# Patient Record
Sex: Female | Born: 1955
Health system: Southern US, Community
[De-identification: ages and names within clinical notes are randomized; demographics above are authoritative.]

## PROBLEM LIST (undated history)

## (undated) DIAGNOSIS — Z1371 Encounter for nonprocreative screening for genetic disease carrier status: Secondary | ICD-10-CM

## (undated) DIAGNOSIS — G4700 Insomnia, unspecified: Secondary | ICD-10-CM

## (undated) DIAGNOSIS — Z853 Personal history of malignant neoplasm of breast: Secondary | ICD-10-CM

## (undated) DIAGNOSIS — N901 Moderate vulvar dysplasia: Secondary | ICD-10-CM

## (undated) DIAGNOSIS — R112 Nausea with vomiting, unspecified: Secondary | ICD-10-CM

## (undated) DIAGNOSIS — Z9889 Other specified postprocedural states: Secondary | ICD-10-CM

## (undated) DIAGNOSIS — H409 Unspecified glaucoma: Secondary | ICD-10-CM

## (undated) HISTORY — PX: MASTECTOMY: SHX3

## (undated) HISTORY — PX: CATARACT EXTRACTION W/ INTRAOCULAR LENS IMPLANT: SHX1309

## (undated) HISTORY — DX: Insomnia, unspecified: G47.00

## (undated) HISTORY — PX: TOTAL MASTECTOMY: SHX6129

## (undated) HISTORY — PX: VAGINAL HYSTERECTOMY: SUR661

## (undated) HISTORY — PX: OTHER SURGICAL HISTORY: SHX169

---

## 1998-09-29 ENCOUNTER — Encounter: Payer: Self-pay | Admitting: Emergency Medicine

## 1998-09-29 ENCOUNTER — Emergency Department (HOSPITAL_COMMUNITY): Admission: EM | Admit: 1998-09-29 | Discharge: 1998-09-29 | Payer: Self-pay | Admitting: Emergency Medicine

## 1999-12-13 ENCOUNTER — Other Ambulatory Visit: Admission: RE | Admit: 1999-12-13 | Discharge: 1999-12-13 | Payer: Self-pay | Admitting: Obstetrics and Gynecology

## 2002-12-21 ENCOUNTER — Encounter: Admission: RE | Admit: 2002-12-21 | Discharge: 2002-12-21 | Payer: Self-pay | Admitting: Obstetrics and Gynecology

## 2002-12-21 ENCOUNTER — Encounter: Payer: Self-pay | Admitting: Obstetrics and Gynecology

## 2002-12-24 ENCOUNTER — Ambulatory Visit (HOSPITAL_COMMUNITY): Admission: RE | Admit: 2002-12-24 | Discharge: 2002-12-24 | Payer: Self-pay | Admitting: Obstetrics and Gynecology

## 2002-12-24 ENCOUNTER — Encounter: Payer: Self-pay | Admitting: Obstetrics and Gynecology

## 2002-12-25 ENCOUNTER — Encounter: Payer: Self-pay | Admitting: Obstetrics and Gynecology

## 2002-12-25 ENCOUNTER — Ambulatory Visit (HOSPITAL_COMMUNITY): Admission: RE | Admit: 2002-12-25 | Discharge: 2002-12-25 | Payer: Self-pay | Admitting: Obstetrics and Gynecology

## 2002-12-28 ENCOUNTER — Encounter (INDEPENDENT_AMBULATORY_CARE_PROVIDER_SITE_OTHER): Payer: Self-pay | Admitting: *Deleted

## 2002-12-28 ENCOUNTER — Ambulatory Visit (HOSPITAL_BASED_OUTPATIENT_CLINIC_OR_DEPARTMENT_OTHER): Admission: RE | Admit: 2002-12-28 | Discharge: 2002-12-28 | Payer: Self-pay | Admitting: General Surgery

## 2003-01-11 ENCOUNTER — Ambulatory Visit (HOSPITAL_BASED_OUTPATIENT_CLINIC_OR_DEPARTMENT_OTHER): Admission: RE | Admit: 2003-01-11 | Discharge: 2003-01-11 | Payer: Self-pay | Admitting: General Surgery

## 2003-01-11 ENCOUNTER — Encounter (INDEPENDENT_AMBULATORY_CARE_PROVIDER_SITE_OTHER): Payer: Self-pay | Admitting: *Deleted

## 2003-01-25 ENCOUNTER — Ambulatory Visit: Admission: RE | Admit: 2003-01-25 | Discharge: 2003-02-18 | Payer: Self-pay | Admitting: Radiation Oncology

## 2003-02-15 ENCOUNTER — Encounter: Payer: Self-pay | Admitting: General Surgery

## 2003-02-16 ENCOUNTER — Encounter: Payer: Self-pay | Admitting: General Surgery

## 2003-02-16 ENCOUNTER — Encounter (INDEPENDENT_AMBULATORY_CARE_PROVIDER_SITE_OTHER): Payer: Self-pay | Admitting: *Deleted

## 2003-02-16 ENCOUNTER — Inpatient Hospital Stay (HOSPITAL_COMMUNITY): Admission: RE | Admit: 2003-02-16 | Discharge: 2003-02-17 | Payer: Self-pay | Admitting: General Surgery

## 2003-08-26 ENCOUNTER — Encounter: Payer: Self-pay | Admitting: Family Medicine

## 2003-08-26 LAB — CONVERTED CEMR LAB
Blood Glucose, Fasting: 96 mg/dL
RBC count: 4.53 10*6/uL

## 2004-01-13 ENCOUNTER — Encounter: Admission: RE | Admit: 2004-01-13 | Discharge: 2004-01-13 | Payer: Self-pay | Admitting: General Surgery

## 2004-08-16 ENCOUNTER — Encounter: Payer: Self-pay | Admitting: Family Medicine

## 2004-08-16 LAB — CONVERTED CEMR LAB: TSH: 1.4 microintl units/mL

## 2004-08-25 ENCOUNTER — Encounter: Payer: Self-pay | Admitting: Family Medicine

## 2004-08-25 LAB — CONVERTED CEMR LAB: Blood Glucose, Fasting: 85 mg/dL

## 2005-01-19 ENCOUNTER — Encounter: Admission: RE | Admit: 2005-01-19 | Discharge: 2005-01-19 | Payer: Self-pay | Admitting: General Surgery

## 2006-01-21 ENCOUNTER — Encounter: Admission: RE | Admit: 2006-01-21 | Discharge: 2006-01-21 | Payer: Self-pay | Admitting: General Surgery

## 2006-02-05 ENCOUNTER — Ambulatory Visit: Payer: Self-pay | Admitting: Family Medicine

## 2006-02-19 ENCOUNTER — Ambulatory Visit: Payer: Self-pay | Admitting: Family Medicine

## 2006-02-19 LAB — CONVERTED CEMR LAB: Blood Glucose, Fasting: 94 mg/dL

## 2006-02-26 ENCOUNTER — Encounter: Admission: RE | Admit: 2006-02-26 | Discharge: 2006-05-27 | Payer: Self-pay | Admitting: Family Medicine

## 2006-11-20 ENCOUNTER — Ambulatory Visit: Payer: Self-pay | Admitting: Family Medicine

## 2007-02-14 ENCOUNTER — Encounter: Admission: RE | Admit: 2007-02-14 | Discharge: 2007-02-14 | Payer: Self-pay | Admitting: General Surgery

## 2007-03-06 ENCOUNTER — Telehealth (INDEPENDENT_AMBULATORY_CARE_PROVIDER_SITE_OTHER): Payer: Self-pay | Admitting: Internal Medicine

## 2007-04-10 ENCOUNTER — Telehealth (INDEPENDENT_AMBULATORY_CARE_PROVIDER_SITE_OTHER): Payer: Self-pay | Admitting: *Deleted

## 2007-08-15 ENCOUNTER — Encounter (INDEPENDENT_AMBULATORY_CARE_PROVIDER_SITE_OTHER): Payer: Self-pay | Admitting: Internal Medicine

## 2007-08-15 ENCOUNTER — Ambulatory Visit: Payer: Self-pay | Admitting: Family Medicine

## 2007-08-15 DIAGNOSIS — F17200 Nicotine dependence, unspecified, uncomplicated: Secondary | ICD-10-CM | POA: Insufficient documentation

## 2007-08-19 LAB — CONVERTED CEMR LAB
Basophils Absolute: 0 10*3/uL (ref 0.0–0.1)
Chloride: 107 meq/L (ref 96–112)
Cholesterol: 169 mg/dL (ref 0–200)
Creatinine, Ser: 0.8 mg/dL (ref 0.4–1.2)
Eosinophils Relative: 3.8 % (ref 0.0–5.0)
Glucose, Bld: 86 mg/dL (ref 70–99)
HCT: 37.9 % (ref 36.0–46.0)
Hemoglobin: 13 g/dL (ref 12.0–15.0)
LDL Cholesterol: 121 mg/dL — ABNORMAL HIGH (ref 0–99)
MCHC: 34.4 g/dL (ref 30.0–36.0)
MCV: 89.8 fL (ref 78.0–100.0)
Monocytes Absolute: 0.7 10*3/uL (ref 0.2–0.7)
Neutrophils Relative %: 43.7 % (ref 43.0–77.0)
Potassium: 4 meq/L (ref 3.5–5.1)
RBC: 4.22 M/uL (ref 3.87–5.11)
RDW: 12.8 % (ref 11.5–14.6)
Sodium: 141 meq/L (ref 135–145)
Total CHOL/HDL Ratio: 4.7
WBC: 6.5 10*3/uL (ref 4.5–10.5)

## 2007-09-21 ENCOUNTER — Encounter: Payer: Self-pay | Admitting: Family Medicine

## 2007-09-21 LAB — CONVERTED CEMR LAB: Rapid Strep: NEGATIVE

## 2007-10-01 ENCOUNTER — Telehealth (INDEPENDENT_AMBULATORY_CARE_PROVIDER_SITE_OTHER): Payer: Self-pay | Admitting: Internal Medicine

## 2007-11-26 ENCOUNTER — Ambulatory Visit: Payer: Self-pay | Admitting: Family Medicine

## 2008-02-16 ENCOUNTER — Encounter: Admission: RE | Admit: 2008-02-16 | Discharge: 2008-02-16 | Payer: Self-pay | Admitting: General Surgery

## 2008-03-01 ENCOUNTER — Telehealth (INDEPENDENT_AMBULATORY_CARE_PROVIDER_SITE_OTHER): Payer: Self-pay | Admitting: Internal Medicine

## 2008-05-05 ENCOUNTER — Telehealth (INDEPENDENT_AMBULATORY_CARE_PROVIDER_SITE_OTHER): Payer: Self-pay | Admitting: Internal Medicine

## 2008-05-06 ENCOUNTER — Encounter: Payer: Self-pay | Admitting: Family Medicine

## 2008-06-18 ENCOUNTER — Ambulatory Visit: Payer: Self-pay | Admitting: Family Medicine

## 2008-06-21 ENCOUNTER — Telehealth: Payer: Self-pay | Admitting: Family Medicine

## 2008-06-22 ENCOUNTER — Encounter: Payer: Self-pay | Admitting: Family Medicine

## 2008-06-22 ENCOUNTER — Ambulatory Visit: Payer: Self-pay | Admitting: Family Medicine

## 2008-06-22 DIAGNOSIS — E782 Mixed hyperlipidemia: Secondary | ICD-10-CM

## 2008-06-22 DIAGNOSIS — F411 Generalized anxiety disorder: Secondary | ICD-10-CM | POA: Insufficient documentation

## 2008-06-22 DIAGNOSIS — C50919 Malignant neoplasm of unspecified site of unspecified female breast: Secondary | ICD-10-CM | POA: Insufficient documentation

## 2008-06-22 DIAGNOSIS — E785 Hyperlipidemia, unspecified: Secondary | ICD-10-CM | POA: Insufficient documentation

## 2008-07-08 ENCOUNTER — Telehealth (INDEPENDENT_AMBULATORY_CARE_PROVIDER_SITE_OTHER): Payer: Self-pay | Admitting: Internal Medicine

## 2008-10-04 ENCOUNTER — Telehealth: Payer: Self-pay | Admitting: Family Medicine

## 2008-11-04 ENCOUNTER — Telehealth (INDEPENDENT_AMBULATORY_CARE_PROVIDER_SITE_OTHER): Payer: Self-pay | Admitting: Internal Medicine

## 2009-02-24 ENCOUNTER — Encounter: Admission: RE | Admit: 2009-02-24 | Discharge: 2009-02-24 | Payer: Self-pay | Admitting: General Surgery

## 2009-03-02 ENCOUNTER — Telehealth (INDEPENDENT_AMBULATORY_CARE_PROVIDER_SITE_OTHER): Payer: Self-pay | Admitting: Internal Medicine

## 2009-06-07 ENCOUNTER — Telehealth (INDEPENDENT_AMBULATORY_CARE_PROVIDER_SITE_OTHER): Payer: Self-pay | Admitting: Internal Medicine

## 2009-06-08 ENCOUNTER — Encounter (INDEPENDENT_AMBULATORY_CARE_PROVIDER_SITE_OTHER): Payer: Self-pay | Admitting: Internal Medicine

## 2009-07-06 ENCOUNTER — Telehealth (INDEPENDENT_AMBULATORY_CARE_PROVIDER_SITE_OTHER): Payer: Self-pay | Admitting: Internal Medicine

## 2009-11-03 ENCOUNTER — Telehealth (INDEPENDENT_AMBULATORY_CARE_PROVIDER_SITE_OTHER): Payer: Self-pay | Admitting: Internal Medicine

## 2009-12-05 ENCOUNTER — Telehealth: Payer: Self-pay | Admitting: Family Medicine

## 2009-12-07 ENCOUNTER — Ambulatory Visit: Payer: Self-pay | Admitting: Family Medicine

## 2009-12-09 ENCOUNTER — Ambulatory Visit: Payer: Self-pay | Admitting: Family Medicine

## 2009-12-12 LAB — CONVERTED CEMR LAB
AST: 18 units/L (ref 0–37)
Albumin: 3.7 g/dL (ref 3.5–5.2)
Alkaline Phosphatase: 83 units/L (ref 39–117)
Basophils Relative: 0.7 % (ref 0.0–3.0)
CO2: 30 meq/L (ref 19–32)
Calcium: 9 mg/dL (ref 8.4–10.5)
Chloride: 108 meq/L (ref 96–112)
Eosinophils Absolute: 0.3 10*3/uL (ref 0.0–0.7)
Glucose, Bld: 83 mg/dL (ref 70–99)
HCT: 40.9 % (ref 36.0–46.0)
Hemoglobin: 13.3 g/dL (ref 12.0–15.0)
Lymphocytes Relative: 42.8 % (ref 12.0–46.0)
Lymphs Abs: 2.9 10*3/uL (ref 0.7–4.0)
MCHC: 32.6 g/dL (ref 30.0–36.0)
Neutro Abs: 2.7 10*3/uL (ref 1.4–7.7)
Potassium: 4.7 meq/L (ref 3.5–5.1)
RBC: 4.37 M/uL (ref 3.87–5.11)
Sodium: 143 meq/L (ref 135–145)
TSH: 1.76 microintl units/mL (ref 0.35–5.50)
Total CHOL/HDL Ratio: 4
Total Protein: 6.3 g/dL (ref 6.0–8.3)

## 2010-02-28 ENCOUNTER — Encounter: Admission: RE | Admit: 2010-02-28 | Discharge: 2010-02-28 | Payer: Self-pay | Admitting: General Surgery

## 2010-07-11 ENCOUNTER — Telehealth: Payer: Self-pay | Admitting: Family Medicine

## 2010-12-05 ENCOUNTER — Ambulatory Visit
Admission: RE | Admit: 2010-12-05 | Discharge: 2010-12-05 | Payer: Self-pay | Source: Home / Self Care | Attending: Family Medicine | Admitting: Family Medicine

## 2010-12-05 NOTE — Progress Notes (Signed)
Summary: Rx Lunesta  Phone Note Refill Request Call back at [phone] Message from:  [facility] on July 11, 2010 4:32 PM  Refills Requested: Medication #1:  LUNESTA 2 MG TABS 1 at bedtime [BMN].   Last Refilled: 06/08/2010 Received faxed refill request please advise.   Method Requested: Telephone to Pharmacy Initial call taken by: Linde Gillis CMA [facility]),  July 11, 2010 4:34 PM  Follow-up for Phone Call        she has 12 rf on the prev rx.  If she is using it more frequently, she needs an OV.  okay to fill rx with same sig as before out to the appointment.  Follow-up by: Crawford Givens MD,  July 11, 2010 6:11 PM  Additional Follow-up for Phone Call Additional follow up Details #1::        I phoned the pharmacy.  Alfonso Patten can only be refilled 6 times, not 12 so the Rx. had expired.  I okayed the remaining 5 refills on the original Rx.  Please advise if this is not acceptable. Additional Follow-up by: Delilah Shan CMA [facility]),  July 12, 2010 9:14 AM    Additional Follow-up for Phone Call Additional follow up Details #2::    fine, good call, thanks.  Follow-up by: Crawford Givens MD,  July 12, 2010 10:04 AM

## 2010-12-05 NOTE — Progress Notes (Signed)
Summary: Rx Lunesta, labs, f/u appt  Phone Note Refill Request Call back at [phone] Message [facility] on December 05, 2009 8:32 AM  Refills Requested: Medication #1:  LUNESTA 2 MG TABS 1 at bedtime [BMN].   Last Refilled: 11/03/2009 Received faxed refill request, please advise   Method Requested: Electronic Initial call taken by: Linde Gillis CMA [facility]),  December 05, 2009 8:33 AM  Follow-up for Phone Call        Pt needs to be seen by whoever will be her new doctor. She hasn't been seen in 11/2 years. Follow-up by: Shaune Leeks MD,  December 05, 2009 8:34 AM  Additional Follow-up for Phone Call Additional follow up Details #1::        Left message on voicemail at her job for her to return call.  Linde Gillis CMA [facility])  December 05, 2009 11:31 AM   Patient would like to see you until you retire.  She would like labs done prior to her visit with you.  Should I schedule her for a 15 min visit or 30 min visit and what labs please?  Linde Gillis CMA [facility])  December 05, 2009 11:58 AM     Additional Follow-up for Phone Call Additional follow up Details #2::    Will see at 4Pm for 30 mins (Pls block [address]) Follow-up by: Shaune Leeks MD,  December 05, 2009 1:28 PM  Additional Follow-up for Phone Call Additional follow up Details #3:: Details for Additional Follow-up Action Taken: Appt scheduled for 12/07/2009 at 4:00 with Dr. Hetty Ely, 4:15 appt slot booked as well.  Patient notified via voicemail at her job.  [phone]. Additional Follow-up by: Linde Gillis CMA [facility]),  December 05, 2009 3:46 PM  Prescriptions: LUNESTA 2 MG TABS (ESZOPICLONE) 1 at bedtime Brand medically necessary #30 x 0   Entered and Authorized by:   Shaune Leeks MD   Signed by:   Shaune Leeks MD on 12/05/2009   Method used:   Telephoned to ...         RxID:   [id number]

## 2010-12-05 NOTE — Assessment & Plan Note (Signed)
Summary: 30 MIN APPT FOLLOW UP PER DR Javious Hallisey/[facility]   Vital Signs:  Patient profile:   55 year old female Height:      68 inches Weight:      175 pounds BMI:     26.70 Temp:     98.5 degrees F oral Pulse rate:   68 / minute Pulse rhythm:   regular BP sitting:   110 / 78  (left arm) Cuff size:   regular  Vitals Entered By: Sydell Axon LPN (December 07, 2009 4:06 PM) CC: 30 Minute checkup, has GYN, needs a refill on medication   History of Present Illness: Pt here for followup for not being seen for a while.  She has no complaints.  Seees Dr Janora Norlander for Gyn.  Preventive Screening-Counseling & Management  Alcohol-Tobacco     Alcohol drinks/day: <1     Alcohol type: beer occas     Smoking Status: current     Packs/Day: 1/2     Year Started: 1977  Caffeine-Diet-Exercise     Caffeine use/day: 1-2     Does Patient Exercise: yes     Type of exercise: walks     Times/week: 5  Problems Prior to Update: 1)  Anxiety Disorder  (ICD-300.00) 2)  Mixed Hyperlipidemia  (ICD-272.2) 3)  Aphthous Ulcers  (ICD-528.2) 4)  Pharyngitis, Acute  (ICD-462) 5)  Uri  (ICD-465.9) 6)  Neop, Malignant, Female Breast Nos  (ICD-174.9) 7)  Tobacco Abuse  (ICD-305.1) 8)  Chest Pain  (ICD-786.50) 9)  Well Adult  (ICD-V70.0)  Medications Prior to Update: 1)  Lunesta 2 Mg Tabs (Eszopiclone) .Marland Kitchen.. 1 At Bedtime  Allergies: No Known Drug Allergies  Past History:  Past Surgical History: Last updated: 06/22/2008 NSVD X 2 PARTIAL HYSTERECTOMY AT AGE 87 Abnml Pap Abnl Cryo LEFT MASTECTOMY NODES NEG NO RAD/CHEMO 12/2002  Family History: Last updated: 12/07/2009 Father dec  89 MI  Mother  dec  56 YOA CHF BROTHER A 60  IN [place]:(DRUGS) BROTHER dec 23 IN MVA BROTHER dec  72  GUN SHOT WOUND BY 16 YOA BROTHER A 72 CAD BROTHER A 62   4 SISTERS  SISTER  dec 8/09 R.F.   (AORTIC /MITRAL VALVE REPLACMENT) SISTER 09-Nov-2058 10/09Accdt death, fell off ladder and hit head SISTER A 58 CAD SISTER A 60s  THIN CV: + FATHER DECEASED MI / +/SISTER RF [id number] DM: NEGATIVE GOUT/ARTHRITIS:  PROSTATE CANCER: BREAST CANCER: + SELF(PAGETS) COLON CANCER: DEPRESSION:NEGATIVE ETOH/DRUG ABUSE: + BROTHER OTHER: NEGATIVE STROKE  Social History: Last updated: 06/22/2008 Marital Status: Married DIVORCED Children: 2 DAUGHTERS HERE// 1 SON IN [place] Occupation: [employer] -- [employer] DEPARTMENT  Risk Factors: Alcohol Use: <1 (12/07/2009) Caffeine Use: 1-2 (12/07/2009) Exercise: yes (12/07/2009)  Risk Factors: Smoking Status: current (12/07/2009) Packs/Day: 1/2 (12/07/2009)  Family History: Father dec  68 MI  Mother  dec  24 YOA CHF BROTHER A 60  IN [place]:(DRUGS) BROTHER dec 23 IN MVA BROTHER dec  54  GUN SHOT WOUND BY 16 YOA BROTHER A 70 CAD BROTHER A 62   4 SISTERS  SISTER  dec 8/09 R.F.   (AORTIC /MITRAL VALVE REPLACMENT) SISTER 11/09/2058 10/09Accdt death, fell off ladder and hit head SISTER A 58 CAD SISTER A 60s THIN CV: + FATHER DECEASED MI / +/SISTER RF [id number] DM: NEGATIVE GOUT/ARTHRITIS:  PROSTATE CANCER: BREAST CANCER: + SELF(PAGETS) COLON CANCER: DEPRESSION:NEGATIVE ETOH/DRUG ABUSE: + BROTHER OTHER: NEGATIVE STROKE  Social History: Caffeine use/day:  1-2  Review of Systems  General:  Denies chills, fatigue, fever, sweats, weakness, and weight loss. Eyes:  Denies blurring, discharge, eye irritation, and eye pain. ENT:  Denies decreased hearing, ear discharge, and earache. CV:  Denies chest pain or discomfort, fainting, fatigue, palpitations, shortness of breath with exertion, and swelling of feet. Resp:  Complains of shortness of breath; denies cough and wheezing; due to smoking. GI:  Denies abdominal pain, bloody stools, change in bowel habits, constipation, dark tarry stools, diarrhea, indigestion, loss of appetite, nausea, vomiting, vomiting blood, and yellowish skin color. GU:  Denies discharge, dysuria, nocturia, and urinary frequency. MS:   Denies joint pain, loss of strength, low back pain, muscle aches, cramps, and muscle weakness. Derm:  Denies dryness, itching, and rash. Neuro:  Denies numbness, poor balance, tingling, and tremors.  Physical Exam  General:  Well-developed,well-nourished,in no acute distress; alert,appropriate and cooperative throughout examination Head:  Normocephalic and atraumatic without obvious abnormalities. No apparent alopecia or balding. Sinuses NT. Eyes:  Conjunctiva clear bilaterally.  Ears:  External ear exam shows no significant lesions or deformities.  Otoscopic examination reveals clear canals, tympanic membranes are intact bilaterally without bulging, retraction, inflammation or discharge. Hearing is grossly normal bilaterally. Nose:  External nasal examination shows no deformity or inflammation. Nasal mucosa are pink and moist without lesions or exudates. Mouth:  Oral mucosa and oropharynx without lesions or exudates.  Teeth in good repair. 8mm white flat ulcer on right tonsillar pillar. Neck:  No deformities, masses, or tenderness noted. Chest Wall:  No deformities, masses, or tenderness noted. Breasts:  Not done, sees Dr Jackelyn Knife. Lungs:  Normal respiratory effort, chest expands symmetrically. Lungs are clear to auscultation, no crackles or wheezes. Heart:  Normal rate and regular rhythm. S1 and S2 normal without gallop, murmur, click, rub or other extra sounds. Abdomen:  soft, non-tender, normal bowel sounds, no distention, no masses, no abdominal hernia, no inguinal hernia, no hepatomegaly, and no splenomegaly.   Rectal:  Not done. Genitalia:  Not done. Msk:  no joint swelling, no joint warmth, no redness over joints, and no joint deformities in exposed joints Pulses:  R dorsalis pedis and popliteal normal and L dorsalis pedis and popliteal normal.   Extremities:  No clubbing, cyanosis, edema, or deformity noted with normal full range of motion of all joints.   Neurologic:  No cranial  nerve deficits noted. Station and gait are normal. Plantar reflexes are down-going bilaterally. DTRs are symmetrical throughout. Sensory, motor and coordinative functions appear intact. Skin:  Intact without suspicious lesions or rashes Cervical Nodes:  No lymphadenopathy noted Inguinal Nodes:  No significant adenopathy Psych:  Cognition and judgment appear intact. Alert and cooperative with normal attention span and concentration. No apparent delusions, illusions, hallucinations   Impression & Recommendations:  Problem # 1:  MIXED HYPERLIPIDEMIA (ICD-272.2) Assessment Unchanged  Will recheck via fasting labs.    HDL:35.6 (08/15/2007)  LDL:121 (08/15/2007)  Chol:169 (08/15/2007)  Trig:64 (08/15/2007)  Problem # 2:  ANXIETY DISORDER (ICD-300.00) Assessment: Improved Seems well controlled at present. Will follow as needed.  Problem # 3:  APHTHOUS ULCERS (ICD-528.2) Assessment: Improved Of throat heakled from 06/2008, no recurrence altho she has cold sores.  Problem # 4:  NEOP, MALIGNANT, FEMALE BREAST NOS (ICD-174.9) Assessment: Unchanged Breast cancer from 02/2003 so far w/o recurrence. Has mmammos regularly.  Problem # 5:  TOBACCO ABUSE (ICD-305.1) Assessment: Unchanged Encouraged to try to quit.  Complete Medication List: 1)  Lunesta 2 Mg Tabs (Eszopiclone) .Marland Kitchen.. 1 at bedtime  Patient Instructions: 1)  Pls schedule fotr fasting labs. Will report via phone tree. Prescriptions: LUNESTA 2 MG TABS (ESZOPICLONE) 1 at bedtime Brand medically necessary #30 x 12   Entered and Authorized by:   Shaune Leeks MD   Signed by:   Shaune Leeks MD on 12/07/2009   Method used:   Print then Give to Patient   RxID:   [id number]   Current Allergies (reviewed today): No known allergies

## 2010-12-13 NOTE — Assessment & Plan Note (Signed)
Summary: COUGHING, EAR and THROAT PAIN and DRAINAGE / LFW   Vital Signs:  Patient profile:   55 year old female Height:      68 inches Weight:      172.50 pounds BMI:     26.32 Temp:     98.4 degrees F oral Pulse rate:   66 / minute Pulse rhythm:   regular BP sitting:   112 / 72  (left arm) Cuff size:   regular  Vitals Entered ByMelody Comas (December 05, 2010 10:59 AM) CC: coughing, ear, throat pain   History of Present Illness: 55 yo new to me here for URI symptoms.  Started several weeks ago with runny nose, subjective fever, dry cough. Now has lingering dry cough, sinus pressure, body aches and ear pain.  Taking Mucinex OTC with little relief of symptoms.  Current Medications (verified): 1)  Lunesta 2 Mg Tabs (Eszopiclone) .Marland Kitchen.. 1 At Bedtime 2)  Amoxicillin 500 Mg Tabs (Amoxicillin) .Marland Kitchen.. 1 Tab By Mouth Two Times A Day X 10 Days 3)  Tessalon Perles 100 Mg  Caps (Benzonatate) .Marland Kitchen.. 1 Tab By Mouth Three Times A Day As Needed Cough  Allergies (verified): No Known Drug Allergies  Past History:  Past Surgical History: Last updated: 06/22/2008 NSVD X 2 PARTIAL HYSTERECTOMY AT AGE 17 Abnml Pap Abnl Cryo LEFT MASTECTOMY NODES NEG NO RAD/CHEMO 12/2002  Family History: Last updated: 12/07/2009 Father dec  36 MI  Mother  dec  29 YOA CHF BROTHER A 60  IN [place]:(DRUGS) BROTHER dec 23 IN MVA BROTHER dec  35  GUN SHOT WOUND BY 16 YOA BROTHER A 70 CAD BROTHER A 62   4 SISTERS  SISTER  dec 8/09 R.F.   (AORTIC /MITRAL VALVE REPLACMENT) SISTER 11-07-58 10/09Accdt death, fell off ladder and hit head SISTER A 58 CAD SISTER A 60s THIN CV: + FATHER DECEASED MI / +/SISTER RF [id number] DM: NEGATIVE GOUT/ARTHRITIS:  PROSTATE CANCER: BREAST CANCER: + SELF(PAGETS) COLON CANCER: DEPRESSION:NEGATIVE ETOH/DRUG ABUSE: + BROTHER OTHER: NEGATIVE STROKE  Social History: Last updated: 06/22/2008 Marital Status: Married DIVORCED Children: 2 DAUGHTERS HERE// 1 SON IN  [place] Occupation: [employer] -- [employer] DEPARTMENT  Risk Factors: Alcohol Use: <1 (12/07/2009) Caffeine Use: 1-2 (12/07/2009) Exercise: yes (12/07/2009)  Risk Factors: Smoking Status: current (12/07/2009) Packs/Day: 1/2 (12/07/2009)  Review of Systems      See HPI General:  Complains of chills and fever. ENT:  Complains of earache, nasal congestion, sinus pressure, and sore throat; denies difficulty swallowing and ear discharge. CV:  Denies chest pain or discomfort. Resp:  Complains of cough; denies shortness of breath, sputum productive, and wheezing.  Physical Exam  General:  Well-developed,well-nourished,in no acute distress; alert,appropriate and cooperative throughout examination VSS Ears:  External ear exam shows no significant lesions or deformities.  Otoscopic examination reveals clear canals, tympanic membranes are intact bilaterally without bulging, retraction, inflammation or discharge. Hearing is grossly normal bilaterally. Nose:  Boggy turbinates, sinuses +/- TTP Mouth:  pharyngeal erythema.   Lungs:  Normal respiratory effort, chest expands symmetrically. Lungs are clear to auscultation, no crackles or wheezes. Heart:  Normal rate and regular rhythm. S1 and S2 normal without gallop, murmur, click, rub or other extra sounds. Extremities:  No clubbing, cyanosis, edema, or deformity noted with normal full range of motion of all joints.   Psych:  Cognition and judgment appear intact. Alert and cooperative with normal attention span and concentration. No apparent delusions, illusions, hallucinations   Impression & Recommendations:  Problem # 1:  SINUSITIS, ACUTE (ICD-461.9) Assessment New Given duration and progression of symptoms, will treat for bacterial sinusitis. Supportive care as per pt instructions. Her updated medication list for this problem includes:    Amoxicillin 500 Mg Tabs (Amoxicillin) .Marland Kitchen... 1 tab by mouth two times a day x 10 days    Tessalon  Perles 100 Mg Caps (Benzonatate) .Marland Kitchen... 1 tab by mouth three times a day as needed cough  Complete Medication List: 1)  Lunesta 2 Mg Tabs (Eszopiclone) .Marland Kitchen.. 1 at bedtime 2)  Amoxicillin 500 Mg Tabs (Amoxicillin) .Marland Kitchen.. 1 tab by mouth two times a day x 10 days 3)  Tessalon Perles 100 Mg Caps (Benzonatate) .Marland Kitchen.. 1 tab by mouth three times a day as needed cough  Patient Instructions: 1)  Take antibiotic as directed.  Drink lots of fluids.  Treat sympotmatically with Mucinex, nasal saline irrigation, and Tylenol/Ibuprofen. Also try claritin D or zyrtec D over the counter- two times a day as needed ( have to sign for them at pharmacy). You can use warm compresses.  Cough suppressant at night. Call if not improving as expected in 5-7 days.  Prescriptions: TESSALON PERLES 100 MG  CAPS (BENZONATATE) 1 tab by mouth three times a day as needed cough  #30 x 0   Entered and Authorized by:   Ruthe Mannan MD   Signed by:   Ruthe Mannan MD on 12/05/2010   Method used:   Electron[facility]        [facility] (retail)       [address]       [place], [place]  [address]       Ph: [phone] or [phone]       Fax: [phone]   RxID:   [id number] AMOXICILLIN 500 MG TABS (AMOXICILLIN) 1 tab by mouth two times a day x 10 days  #20 x 0   Entered and Authorized by:   Ruthe Mannan MD   Signed by:   Ruthe Mannan MD on 12/05/2010   Method used:   Electronically [facility]        [facility] [id number] ([facility]ail)       [address]       [place], [place]  [address]       Ph: [phone] or [phone]       Fax: [phone]   RxID:   [id number]    Orders Added: 1)  Est. Patient Level III [12878]    Prior Medications (reviewed today): LUNESTA 2 MG TABS (ESZOPICLONE) 1 at bedtime Current Allergies (reviewed today): No known allergies

## 2011-01-03 ENCOUNTER — Telehealth: Payer: Self-pay | Admitting: Family Medicine

## 2011-01-04 ENCOUNTER — Telehealth: Payer: Self-pay | Admitting: Family Medicine

## 2011-01-10 ENCOUNTER — Other Ambulatory Visit (INDEPENDENT_AMBULATORY_CARE_PROVIDER_SITE_OTHER): Payer: 59

## 2011-01-10 ENCOUNTER — Other Ambulatory Visit: Payer: 59

## 2011-01-10 ENCOUNTER — Other Ambulatory Visit: Payer: Self-pay | Admitting: Family Medicine

## 2011-01-10 DIAGNOSIS — E782 Mixed hyperlipidemia: Secondary | ICD-10-CM

## 2011-01-10 DIAGNOSIS — R079 Chest pain, unspecified: Secondary | ICD-10-CM

## 2011-01-10 LAB — CBC WITH DIFFERENTIAL/PLATELET
Basophils Relative: 1 % (ref 0.0–3.0)
Eosinophils Relative: 3.1 % (ref 0.0–5.0)
HCT: 41.4 % (ref 36.0–46.0)
Lymphs Abs: 2.9 10*3/uL (ref 0.7–4.0)
MCV: 91 fl (ref 78.0–100.0)
Monocytes Absolute: 0.8 10*3/uL (ref 0.1–1.0)
Neutro Abs: 3.8 10*3/uL (ref 1.4–7.7)
RBC: 4.56 Mil/uL (ref 3.87–5.11)
WBC: 7.8 10*3/uL (ref 4.5–10.5)

## 2011-01-10 LAB — HEPATIC FUNCTION PANEL: Total Bilirubin: 0.9 mg/dL (ref 0.3–1.2)

## 2011-01-10 LAB — BASIC METABOLIC PANEL
BUN: 17 mg/dL (ref 6–23)
Creatinine, Ser: 0.8 mg/dL (ref 0.4–1.2)
GFR: 76.47 mL/min (ref >60.00)
Potassium: 5 mEq/L (ref 3.5–5.1)

## 2011-01-10 LAB — LIPID PANEL
LDL Cholesterol: 98 mg/dL (ref 0–99)
VLDL: 22.8 mg/dL (ref 0.0–40.0)

## 2011-01-11 NOTE — Progress Notes (Signed)
Summary: Lunesta  Phone Note Refill Request Message from:  Fax from Pharmacy on January 03, 2011 10:11 AM  Refills Requested: Medication #1:  LUNESTA 2 MG TABS 1 at bedtime [BMN] [facility]  Phone:   [phone]  Fax:   [phone]   Method Requested: Telephone to Pharmacy Initial call taken by: Delilah Shan CMA [facility]),  January 03, 2011 10:11 AM  Follow-up for Phone Call        May have one month's worth and needs to be seen for a Comp Exam.  Laurita Quint, MD  Additional Follow-up for Phone Call Additional follow up Details #1::        Patient notified as instructed by telephone. Was informed by patient that she will call back and get this scheduled. Rx called to pharmacy. Additional Follow-up by: Sydell Axon LPN,  January 03, 2011 2:49 PM    Prescriptions: LUNESTA 2 MG TABS (ESZOPICLONE) 1 at bedtime Brand medically necessary #30 x 0   Entered and Authorized by:   Shaune Leeks MD   Signed by:   Shaune Leeks MD on 01/03/2011   Method used:   Telephoned to ...       [facility] (retail)       [address]       [place], [place]  [address]       Ph: [phone] or [phone]       Fax: [phone]   RxID:   [id number]

## 2011-01-16 ENCOUNTER — Encounter (INDEPENDENT_AMBULATORY_CARE_PROVIDER_SITE_OTHER): Payer: Self-pay | Admitting: *Deleted

## 2011-01-16 NOTE — Progress Notes (Signed)
Summary: needs physical  Phone Note Call from Patient Call back at Home Phone [phone]   Caller: Patient Summary of Call: Pt is due for a physical but has been having mild chest pains for the past 2-3 weeks.  I told her we should get her in to evaluate that problem, since it may be awhile before she can get in for a physical.  She is asking if should have cholesterol labs now also.  And, also, do you want me to put her with you as her primary, since you saw her for her last check up.  Please advise. Initial call taken by: Lowella Petties CMA, AAMA,  January 04, 2011 1:55 PM  Follow-up for Phone Call        Appt to evaluate fine soon. Labs prior might as well be done for the exam...just put her in 2 appts next week with me with Bmet, Hepatic, CBC 786.5 Chol Profile, TSH 272.2 prior. Follow-up by: Shaune Leeks MD,  January 04, 2011 2:59 PM  Additional Follow-up for Phone Call Additional follow up Details #1::        Advised pt, appts scheduled. Additional Follow-up by: Lowella Petties CMA, AAMA,  January 08, 2011 12:30 PM     Appended Document:

## 2011-01-17 ENCOUNTER — Encounter: Payer: Self-pay | Admitting: Family Medicine

## 2011-01-17 ENCOUNTER — Ambulatory Visit (INDEPENDENT_AMBULATORY_CARE_PROVIDER_SITE_OTHER): Payer: 59 | Admitting: Family Medicine

## 2011-01-17 DIAGNOSIS — R079 Chest pain, unspecified: Secondary | ICD-10-CM

## 2011-01-17 DIAGNOSIS — Z0000 Encounter for general adult medical examination without abnormal findings: Secondary | ICD-10-CM

## 2011-01-23 NOTE — Assessment & Plan Note (Signed)
Summary: CHECK UP- CHEST PAINS OFF AND ON   Vital Signs:  Patient profile:   54 year old female Weight:      177 pounds Temp:     98.4 degrees F oral Pulse rate:   80 / minute Pulse rhythm:   regular BP sitting:   110 / 66  (right arm) Cuff size:   regular  Vitals Entered By: Sydell Axon LPN (January 17, 2011 10:33 AM) CC: Checkup, has a GYN    History of Present Illness: Pt here as new pt to me for Comp Exam. She has a Automotive engineer Gyn, Dr Jackelyn Knife, lat Pap last 04/2010 and last mammo was 01/2010. She has never had abnornmalds on either. She also sees a new Breast Surgeon yearly for breast cancer in 02/2003, surgery on 4/13! She had no nausea, no SOB, no diaphoesis...pain left side of chest, front to back into the left arm some, 5-6/10 at its worst for 30-72mins. She did no activity and eventually improved after 30 mins with sitting down. She had been having chest pain in her chest for a couple of days about 2 weeks that is ncompletely gone, left after a few days. She denies falling, unusual activity, lifting, work. She works for [employer] as a Pensions consultant. She does public meetings at night twice a month which are often contentious. She had some issue with the board of adjustment about that time. Also her sister's husband who lives in [place] and had heart surgery at [facility] and would stay with the pt at that time and Ms Milnes was concerned about the pains being HER heart. She has had pains like this before....remembers severe chest pain for half a day when she was 55 years old. She has had EKG in the past and was told it was fine.    Preventive Screening-Counseling & Management  Alcohol-Tobacco     Alcohol drinks/day: <1     Alcohol type: beer occas     Smoking Status: current     Packs/Day: 1/2     Year Started: 1977  Caffeine-Diet-Exercise     Caffeine use/day: 1-2     Does Patient Exercise: yes     Type of exercise: walks     Times/week: 5  Problems Prior to  Update: 1)  Health Maintenance Exam  (ICD-V70.0) 2)  Chest Pain Unspecified  (ICD-786.50) 3)  Anxiety Disorder  (ICD-300.00) 4)  Mixed Hyperlipidemia  (ICD-272.2) 5)  Neop, Malignant, Female Breast Nos  (ICD-174.9) 6)  Tobacco Abuse  (ICD-305.1)  Medications Prior to Update: 1)  Lunesta 2 Mg Tabs (Eszopiclone) .Marland Kitchen.. 1 At Bedtime 2)  Tessalon Perles 100 Mg  Caps (Benzonatate) .Marland Kitchen.. 1 Tab By Mouth Three Times A Day As Needed Cough  Current Medications (verified): 1)  Lunesta 2 Mg Tabs (Eszopiclone) .Marland Kitchen.. 1 At Bedtime  Allergies: No Known Drug Allergies  Past History:  Past Surgical History: Last updated: 06/22/2008 NSVD X 2 PARTIAL HYSTERECTOMY AT AGE 80 Abnml Pap Abnl Cryo LEFT MASTECTOMY NODES NEG NO RAD/CHEMO 12/2002  Family History: Last updated: 01/17/2011 Father dec  55 MI  Mother  dec  49 YOA CHF BROTHER A 66  IN [place]:(DRUGS) BROTHER dec 23 IN MVA BROTHER dec  6  GUN SHOT WOUND BY 16 YOA BROTHER A 72 CAD (Valve Surg) BROTHER A 64 4 SISTERS  SISTER  January 02, 2063yoa  8/09 R.F.   (AORTIC /MITRAL VALVE REPLACMENT) SISTER November 06, 2058 09/04/23 Accdt death, fell off ladder and hit  head SISTER A 60 CAD (Valve Repl) SISTER A 60s THIN CV: + FATHER DECEASED MI / +/SISTER RF [id number] DM: NEGATIVE GOUT/ARTHRITIS:  PROSTATE CANCER: BREAST CANCER: + SELF(PAGETS) COLON CANCER: DEPRESSION:NEGATIVE ETOH/DRUG ABUSE: + BROTHER OTHER: NEGATIVE STROKE  Social History: Last updated: 06/22/2008 Marital Status: Married DIVORCED Children: 2 DAUGHTERS HERE// 1 SON IN [place] Occupation: [employer] PL[employer]ING [employer]PARTMENT  Risk Factors: Alcohol Use: <1 (01/17/2011) Caffeine Use: 1-2 (01/17/2011) Exercise: yes (01/17/2011)  Risk Factors: Smoking Status: current (01/17/2011) Packs/Day: 1/2 (01/17/2011)  Family History: Father dec  33 MI  Mother  dec  50 YOA CHF BROTHER A 66  IN [place]:(DRUGS) BROTHER dec 23 IN MVA BROTHER dec  68  GUN SHOT WOUND BY 16 YOA BROTHER A 72 CAD  (Valve Surg) BROTHER A 64 4 SISTERS  SISTER  12/16/2062yoa  8/09 R.F.   (AORTIC /MITRAL VALVE REPLACMENT) SISTER 20-Oct-2058 08/18/23 Accdt death, fell off ladder and hit head SISTER A 60 CAD (Valve Repl) SISTER A 60s THIN CV: + FATHER DECEASED MI / +/SISTER RF JWJ:[id number] DM: NEGATIVE GOUT/ARTHRITIS:  PROSTATE CANCER: BREAST CANCER: + SELF(PAGETS) COLON CANCER: DEPRESSION:NEGATIVE ETOH/DRUG ABUSE: + BROTHER OTHER: NEGATIVE STROKE  Review of Systems General:  Denies chills, fatigue, fever, sweats, weakness, and weight loss. Eyes:  Denies blurring, discharge, and eye pain. ENT:  Denies decreased hearing, ear discharge, earache, and ringing in ears. CV:  Denies chest pain or discomfort, fainting, fatigue, palpitations, shortness of breath with exertion, swelling of feet, and swelling of hands. Resp:  Denies cough, shortness of breath, and wheezing. GI:  Denies abdominal pain, bloody stools, change in bowel habits, constipation, dark tarry stools, diarrhea, indigestion, loss of appetite, nausea, vomiting, vomiting blood, and yellowish skin color; salad with blue cheese dressing causes nauseation and sickness....about the time of the chest pain.. GU:  Denies discharge, dysuria, incontinence, nocturia, and urinary frequency. MS:  Complains of joint pain and cramps; denies low back pain, muscle aches, and stiffness. Derm:  Denies dryness, itching, and rash. Neuro:  Denies numbness, poor balance, tingling, and tremors.  Physical Exam  General:  Well-developed,well-nourished,in no acute distress; alert,appropriate and cooperative throughout examination Head:  Normocephalic and atraumatic without obvious abnormalities.  Sinuses NT. Eyes:  Conjunctiva clear bilaterally.  Ears:  External ear exam shows no significant lesions or deformities.  Otoscopic examination reveals clear canals, tympanic membranes are intact bilaterally without bulging, retraction, inflammation or discharge. Hearing is grossly  normal bilaterally. Nose:  External nasal examination shows no deformity or inflammation. Nasal mucosa are pink and moist without lesions or exudates. Mouth:  Oral mucosa and oropharynx without lesions or exudates.  Teeth in good repair. Neck:  No deformities, masses, or tenderness noted. Chest Wall:  No deformities, masses, or tenderness noted. Breasts:  Not Done, sees Gyn Lungs:  Normal respiratory effort, chest expands symmetrically. Lungs are clear to auscultation, no crackles or wheezes. Heart:  Normal rate and regular rhythm. S1 and S2 normal without gallop, murmur, click, rub or other extra sounds. Abdomen:  soft, non-tender, normal bowel sounds, no distention, no masses, no abdominal hernia, no inguinal hernia, no hepatomegaly, and no splenomegaly.   Rectal:  Not done. Genitalia:  Not done. Sees Gyn. Msk:  no joint swelling, no joint warmth, no redness over joints, and no joint deformities in exposed joints Pulses:  R dorsalis pedis and popliteal normal and L dorsalis pedis and popliteal normal.   Extremities:  No clubbing, cyanosis, edema, or deformity noted with normal full range  of motion of all joints.   Neurologic:  No cranial nerve deficits noted. Station and gait are normal.  Sensory, motor and coordinative functions appear intact. Skin:  Intact without suspicious lesions or rashes Cervical Nodes:  No lymphadenopathy noted Inguinal Nodes:  No significant adenopathy Psych:  Cognition and judgment appear intact. Alert and cooperative with normal attention span and concentration. No apparent delusions, illusions, hallucinations   Impression & Recommendations:  Problem # 1:  HEALTH MAINTENANCE EXAM (ICD-V70.0) Tdap today.  Discussed colon cancer screening.  Problem # 2:  CHEST PAIN UNSPECIFIED (ICD-786.50) Assessment: Improved Story difficult. Does not sound like straightforward heart problem. Most FH signif for valvular problems not circulatory.  Problem # 3:  ANXIETY  DISORDER (ICD-300.00) Assessment: Unchanged Reasonably controlled today. Will follow.  Problem # 4:  MIXED HYPERLIPIDEMIA (ICD-272.2) Assessment: Unchanged In general, good nos. Cont as is. Discussed diet. Labs Reviewed: SGOT: 21 (01/10/2011)   SGPT: 11 (01/10/2011)   HDL:38.30 (01/10/2011), 40.90 (12/09/2009)  LDL:98 (01/10/2011), 88 (12/09/2009)  Chol:159 (01/10/2011), 145 (12/09/2009)  Trig:114.0 (01/10/2011), 81.0 (12/09/2009)  Problem # 5:  NEOP, MALIGNANT, FEMALE BREAST NOS (ICD-174.9) Assessment: Unchanged Conts with surgeon.  Problem # 6:  TOBACCO ABUSE (ICD-305.1) Assessment: Unchanged Unable to convince to quit. Discussed at length, esp in light of chest pain.  Complete Medication List: 1)  Lunesta 2 Mg Tabs (Eszopiclone) .Marland Kitchen.. 1 at bedtime  Other Orders: EKG w/ Interpretation (93000)  Anticoagulation Management Assessment/Plan:            Patient Instructions: 1)  RTC as discussed. 2)  MNeeds Tdap. She is to check her records at the workplace. 3)  Will do stool card in the future.   Orders Added: 1)  Est. Patient 40-64 years [99396] 2)  Est. Patient Level III [29562] 3)  EKG w/ Interpretation [93000]    Current Allergies (reviewed today): No known allergies

## 2011-01-25 ENCOUNTER — Other Ambulatory Visit: Payer: Self-pay | Admitting: General Surgery

## 2011-01-25 DIAGNOSIS — Z1231 Encounter for screening mammogram for malignant neoplasm of breast: Secondary | ICD-10-CM

## 2011-02-02 ENCOUNTER — Other Ambulatory Visit: Payer: Self-pay | Admitting: *Deleted

## 2011-02-02 MED ORDER — ESZOPICLONE 2 MG PO TABS: 2.0000 mg | ORAL_TABLET | Freq: Every day | ORAL | Status: DC

## 2011-02-02 NOTE — Telephone Encounter (Signed)
Patient was here on 01-17-11 for complete exam.

## 2011-02-02 NOTE — Telephone Encounter (Signed)
This pt was told a year ago to make an appt for a Comp Exam. She has not been seen at all. She needs to be seen before I will give a refill for a sleeping medicine I do not like people using.

## 2011-02-05 ENCOUNTER — Other Ambulatory Visit: Payer: Self-pay | Admitting: *Deleted

## 2011-02-05 DIAGNOSIS — G4700 Insomnia, unspecified: Secondary | ICD-10-CM

## 2011-02-05 MED ORDER — ESZOPICLONE 2 MG PO TABS: 2.0000 mg | ORAL_TABLET | Freq: Every day | ORAL | Status: DC

## 2011-02-05 NOTE — Telephone Encounter (Signed)
Rx called in as directed.   

## 2011-02-05 NOTE — Telephone Encounter (Signed)
Please phone in

## 2011-03-02 ENCOUNTER — Ambulatory Visit
Admission: RE | Admit: 2011-03-02 | Discharge: 2011-03-02 | Disposition: A | Discharge status: Home / Self Care | Payer: 59 | Source: Ambulatory Visit | Attending: General Surgery | Admitting: General Surgery

## 2011-03-02 ENCOUNTER — Other Ambulatory Visit: Payer: Self-pay | Admitting: General Surgery

## 2011-03-02 DIAGNOSIS — Z1231 Encounter for screening mammogram for malignant neoplasm of breast: Secondary | ICD-10-CM

## 2011-03-16 ENCOUNTER — Other Ambulatory Visit: Payer: Self-pay | Admitting: Dermatology

## 2011-03-23 NOTE — Op Note (Signed)
   NAMEKAMEO, BAINS                         ACCOUNT NO.:  [id number]   MEDICAL RECORD NO.:  [id number]                   PATIENT TYPE:  OUT   LOCATION:  MRI                                  FACILITY:  [facility]   PHYSICIAN:  Rose Phi. Maple Hudson, M.D.                DATE OF BIRTH:  14-Oct-1956   DATE OF PROCEDURE:  12/28/2002  DATE OF DISCHARGE:  12/25/2002                                 OPERATIVE REPORT   PREOPERATIVE DIAGNOSIS:  Padgett's disease of the left nipple.   POSTOPERATIVE DIAGNOSIS:  Padgett's disease of the left nipple.   OPERATION PERFORMED:  Left partial mastectomy incorporating the nipple  areolar complex.   SURGEON:  Rose Phi. Maple Hudson, M.D.   ANESTHESIA:  General.   INDICATIONS FOR PROCEDURE:  This 55 year old female had presented with an  excoriation of the left nipple that had been present for a couple of months.  It was biopsied by Dr. Swaziland and was read as probable Padgett's disease.  A  mammogram had shown some microcalcifications in the subareolar tissue  compatible with DCIS.  MRI of the left breast was normal.   DESCRIPTION OF PROCEDURE:  After suitable general anesthesia was induced,  the patient was placed in the supine position with the left arm extended on  the arm board.  The left breast was prepped and draped in the usual fashion.  A transverse elliptical incision incorporating the nipple areolar complex  and extending both medial and laterally was then made.  The incision was  made and the whole central core of the breast was excised including the  nipple areolar complex.  Hemostasis was obtained with the cautery.  Tissue  was then infiltrated with 0.25%  Marcaine.  Clips were used to mark the space.  Deeper tissue was  approximated with 3-0 Vicryl and the skin with subcuticular 4-0 Monocryl an  Steri-Strips.  Dressing applied.  The patient was then transferred to the  recovery room in satisfactory condition having tolerated the procedure  well.                                                Rose Phi. Maple Hudson, M.D.    PRY/MEDQ  D:  12/28/2002  T:  12/28/2002  Job:  [id number]   cc:   Zenaida Niece, M.D.  [address]  [place]  [place] [address]  Fax: [phone]   Amy Y. Swaziland, M.D.  [address]  [place]  [place] [address]  Fax: [phone]

## 2011-03-23 NOTE — Op Note (Signed)
NAMEJENNI, Wendy Pruitt                         ACCOUNT NO.:  [id number]   MEDICAL RECORD NO.:  [id number]                   PATIENT TYPE:  INP   LOCATION:  2550                                 FACILITY:  [facility]   PHYSICIAN:  Alfredia Ferguson, M.D.               DATE OF BIRTH:  [birth year]   DATE OF PROCEDURE:  02/16/2003  DATE OF DISCHARGE:                                 OPERATIVE REPORT   PREOPERATIVE DIAGNOSES:  1. Left breast cancer.  2. Acquired absence of left breast secondary to #1.   POSTOPERATIVE DIAGNOSES:  1. Left breast cancer.  2. Acquired absence of left breast secondary to #1.   OPERATION:  Immediate breast reconstruction with placement of a submuscular  Mentor smooth shell saline implant 300 cc inflated to total volume of 325  cc.   SURGEON:  Alfredia Ferguson, M.D.   ANESTHESIA:  General endotracheal   INDICATIONS FOR PROCEDURE:  This is a 55 year old woman who is undergoing  left mastectomy for breast cancer today.  She wishes to undergo a single  stage breast reconstruction if possible.  She understands that I may have to  put a tissue expander in.  She understands that the implant I put in will  not give her perfect symmetry.  The risks of infection, bleeding, capsule  contracture, seroma, hematoma, uncertain life span of the implant, rippling  of the implant, malpositioning of the implant and overall dissatisfaction  with reconstruction were discussed with the patient.  Inspite of the  potential of the risks discussed, the patient wishes to proceed with the  operation.   DESCRIPTION OF PROCEDURE:  On the day prior to surgery, skin markers were  placed outlining the dimensions of the breast.  Following the completion of  the mastectomy today, I was summoned to the operating room.  Mastectomy site  was inspected and irrigated.  A 10 mm Blake drain was placed in the lateral  axillary gutter and brought out through separate stab incision.  A 6 cm  incision was  made through the pectoralis muscle in the direction of its  fibers.  A submuscular pocket was created large enough to accommodate a 300  cc saline implant.  Care was taken to make the entire pocket submuscular.  This was accomplished with the exception of the inferior most portion which  was more subfascial.  The pocket was copiously irrigated with saline  irrigation and inspected for hemostasis.  Once hemostasis had been assured,  a 300 cc saline implant was prepared by evacuating the air and placing 100  cc of sterile saline.  The implant was placed in the desired position.  3-0  Vicryl horizontal mattresses were placed in the pectoralis muscle incision  and were left untied until completing the inflation of the implant.  The  implant was inflated to a total volume of 325 cc.  It was in good position.  The fill tubing was removed and the fill cap was pushed down into the fill  port.  The previously placed 3-0 Vicryl sutures were now tied.  The  mastectomy site was again irrigated with saline irrigation and inspected for  hemostasis.  Hemostasis was assured.  The mastectomy incision was closed  using multiple interrupted 3-0 Monocryl sutures for the dermis.  The  sentinel node biopsy site was closed with 4-0 Monocryl in the dermis.  The  skin edges were closed using a running 3-0 Monocryl subcuticular for the  mastectomy incision.  The skin edges were closed with Steri-Strips on the  sentinel node incision in the axilla.  Skin edges appeared to be completely viable at the conclusion of the  procedure.  A light dressing was applied over the left breast incision.  The  patient was awakened, extubated and transported to the recovery room in  satisfactory condition.                                               Alfredia Ferguson, M.D.    WBB/MEDQ  D:  02/16/2003  T:  02/16/2003  Job:  [id number]

## 2011-03-23 NOTE — Op Note (Signed)
Wendy Pruitt, Wendy Pruitt                         ACCOUNT NO.:  [id number]   MEDICAL RECORD NO.:  [id number]                   PATIENT TYPE:  INP   LOCATION:  2550                                 FACILITY:  [facility]   PHYSICIAN:  Rose Phi. Young, M.D.                DATE OF BIRTH:  July 29, 1956   DATE OF PROCEDURE:  02/16/2003  DATE OF DISCHARGE:                                 OPERATIVE REPORT   PREOPERATIVE DIAGNOSIS:  Extensive ductal carcinoma in situ of the left  breast.   POSTOPERATIVE DIAGNOSIS:  Extensive ductal carcinoma in situ of the left  breast.   OPERATION:  Left total mastectomy with sentinel lymph node biopsy to be  followed by implant reconstruction.   SURGEON:  Rose Phi. Maple Hudson, M.D.   ANESTHESIA:  General   INDICATIONS FOR PROCEDURE:  This 55 year old female had presented with  Padgett's disease of the nipple and after a wide excision of the nipple  areolar complex in the central portion of the breast, she was found to have  extensive DCIS with involved margins.  We attempted a re-excision but that  continued with involved margins and she is now undergoing a mastectomy.   DESCRIPTION OF PROCEDURE:  Prior to coming to the operating room 1  millicurie of technetium sulfur colloid was injected intradermally.  After  suitable general anesthesia was induced, the patient was placed in a supine  position with the left arm extended on the arm board.  Scanning with the  Neoprobe revealed a hot spot in the left axilla.  A short transverse left  axillary incision was made with dissection through the subcutaneous tissue  to the pectoral fascia.  Just deep to the fascia was a hot lymph node.  We  had not injected blue dye.  I excised the hot node with counts in excess of  400 and then scanned the rest of the axilla and there were no other hot  spots and no palpable nodes.  That node was submitted for touch preps.  Good  hemostasis.   While the touch prep was being done, we  excised the old scar that had  included the incorporation of the nipple areolar complex and gave a  transverse scar.  We then did a careful total mastectomy that was basically  skin sparing in nature.  Going superiorly to near the clavicle and medially  to the sternum and inferiorly to the inframammary fold at the rectus fascia  and laterally to the latissmus dorsi muscle.  We then removed the breast by  dissecting from medial lateral trying to preserve the pectoralis fascia.  Following the removal of the breast, we had good hemostasis.   Touch prep on the sentinel lymph node was negative.   We thoroughly irrigated the field with saline and then Dr. Benna Dunks was coming  in to do reconstruction, which will be dictated in a separate note.  Rose Phi. Maple Hudson, M.D.    PRY/MEDQ  D:  02/16/2003  T:  02/16/2003  Job:  [id number]

## 2011-03-23 NOTE — Op Note (Signed)
   NAMERONNA, HERSKOWITZ                         ACCOUNT NO.:  [id number]   MEDICAL RECORD NO.:  [id number]                   PATIENT TYPE:  AMB   LOCATION:  DSC                                  FACILITY:  [facility]   PHYSICIAN:  Rose Phi. Young, M.D.                DATE OF BIRTH:  Sep 10, 1956   DATE OF PROCEDURE:  01/11/2003  DATE OF DISCHARGE:                                 OPERATIVE REPORT   PREOPERATIVE DIAGNOSIS:  Residual ductal carcinoma in situ, left breast.   POSTOPERATIVE DIAGNOSIS:  Residual ductal carcinoma in situ, left breast.   OPERATION PERFORMED:  Re-excision of a portion of the lumpectomy site.   SURGEON:  Rose Phi. Maple Hudson, M.D.   ANESTHESIA:  General.   INDICATIONS FOR PROCEDURE:  This 55 year old female had presented with  Paget's disease of the nipple.  We had done a partial mastectomy including  the central portion of her left breast which showed clean margins of DCIS  and no invasive component except at the 3 o'clock margin.  I am bringing her  back now to excise that.   DESCRIPTION OF PROCEDURE:  After suitable anesthesia was induced, the  patient was placed in a supine position and the left breast prepped and  draped in the usual fashion.  The lateral half of the previous lumpectomy  incision was then incised.  We had closed the deep tissue so there was a  very small seroma cavity.  I then excised all the tissue in the lateral  portion of the breast from about 1 o'clock to about 5 o'clock.  I oriented  the specimen with a suture and with methylene blue for the outer margin.  Hemostasis obtained with the cautery.  Wound closed with 3-0 Vicryl and  subcuticular 4-0 Monocryl and Steri-Strips.  Dressings applied.  The patient  was then transferred to the recovery room in satisfactory condition having  tolerated the procedure well.                                                Rose Phi. Maple Hudson, M.D.    PRY/MEDQ  D:  01/11/2003  T:  01/11/2003  Job:  [id number]

## 2011-07-05 ENCOUNTER — Ambulatory Visit (INDEPENDENT_AMBULATORY_CARE_PROVIDER_SITE_OTHER): Payer: 59 | Admitting: Family Medicine

## 2011-07-05 ENCOUNTER — Encounter: Payer: Self-pay | Admitting: Family Medicine

## 2011-07-05 DIAGNOSIS — T17910A Gastric contents in respiratory tract, part unspecified causing asphyxiation, initial encounter: Secondary | ICD-10-CM | POA: Insufficient documentation

## 2011-07-05 DIAGNOSIS — T17308A Unspecified foreign body in larynx causing other injury, initial encounter: Secondary | ICD-10-CM

## 2011-07-05 NOTE — Progress Notes (Signed)
  Subjective:    Patient ID: Wendy Pruitt, female    DOB: 1955/11/18, 55 y.o.   MRN: [id number]  HPI Pt here as acute workin for not feeling well for a few days and thinking she was coming down with a chest cold. She ate very late for her 830-9PM had spaghetti and woke up about 3AM, tasted the spaghetti and "couldn't breathe" for about 15-20 secs. She put water on her face and in her mouth and finally was able to breathe. This happened once before about a year ago and tasted what she had eaten and then had the same dificulty breathing. She in general has not trouble breathing, no wheezing and no chronic cough. She also generally has no trouble eating, no chest pain and no heartburn or chest tightness.  She denies fever, chills ST or rhinitis, no cough and no congestive feelings.    Review of SystemsNoncontributory except as above.       Objective:   Physical Exam  Constitutional: She appears well-developed and well-nourished. No distress.  HENT:  Head: Normocephalic and atraumatic.  Right Ear: External ear normal.  Left Ear: External ear normal.  Nose: Nose normal.  Mouth/Throat: Oropharynx is clear and moist. No oropharyngeal exudate.  Eyes: Conjunctivae and EOM are normal. Pupils are equal, round, and reactive to light.  Neck: Normal range of motion. Neck supple. No thyromegaly present.  Cardiovascular: Normal rate, regular rhythm and normal heart sounds.   Pulmonary/Chest: Effort normal and breath sounds normal. She has no wheezes. She has no rales.  Abdominal: Soft. Bowel sounds are normal. She exhibits no distension. There is no tenderness.  Lymphadenopathy:    She has no cervical adenopathy.  Skin: She is not diaphoretic.          Assessment & Plan:

## 2011-07-05 NOTE — Assessment & Plan Note (Signed)
Presumed transient, fleeting apiration of small amount of stomach contents from large, late, tomato product meal that triggered a bronchial spasm attack that resolved on its own. Would prophylax for one month with PPI and then keep for situations where has no choice but to eat late. When she has to eat late, would avoid tomato products and not overeat and stay upright as long as absolutely possible.

## 2011-07-05 NOTE — Patient Instructions (Addendum)
Take Prilosec OTC 45 mins prior to Brfst for one month and then take prophylactically before eating a late meal , if has to do that. Not eating late and not overeating would be preferable.

## 2011-07-13 ENCOUNTER — Ambulatory Visit (INDEPENDENT_AMBULATORY_CARE_PROVIDER_SITE_OTHER): Payer: 59 | Admitting: Family Medicine

## 2011-07-13 ENCOUNTER — Encounter: Payer: Self-pay | Admitting: Family Medicine

## 2011-07-13 VITALS — BP 104/72 | HR 78 | Temp 98.3°F | Wt 181.0 lb

## 2011-07-13 DIAGNOSIS — J069 Acute upper respiratory infection, unspecified: Secondary | ICD-10-CM | POA: Insufficient documentation

## 2011-07-13 MED ORDER — BENZONATATE 200 MG PO CAPS: 200.0000 mg | ORAL_CAPSULE | Freq: Three times a day (TID) | ORAL | Status: AC | PRN

## 2011-07-13 NOTE — Progress Notes (Signed)
duration of symptoms: worse over last 3 days.  Started Monday with cough rhinorrhea:no Congestion: yes, mild ear pain: Some R ear pain, itchy sore throat: yes, mild Cough:yes, some sputum (discolored) myalgias:no other concerns:no fevers.  No FCNAVD.   She's thinking about quitting smoking.   Tessalon help with the cough. Grandkids have been sick, they're in daycare.    ROS: See HPI.  Otherwise negative.    Meds, vitals, and allergies reviewed.   GEN: nad, alert and oriented HEENT: mucous membranes moist, TM w/o erythema, nasal epithelium injected, OP with cobblestoning, sinuses not ttp, R SOM noted.  NECK: supple w/o LA CV: rrr. PULM: ctab, no inc wob, no wheeze ABD: soft, +bs EXT: no edema

## 2011-07-13 NOTE — Patient Instructions (Signed)
Drink plenty of fluids, take tylenol as needed, and gargle with warm salt water for your throat.  Take tessalon for cough.  This should gradually improve.  Take care.  Let us know if you have other concerns.

## 2011-07-13 NOTE — Assessment & Plan Note (Signed)
Lungs ctab, prevalent in community.  Nontoxic.  Likely viral. ddx d/w pt.  Supportive tx with rest and fluids.   Tessalon prn for cough.  F/u prn.  She agrees.

## 2011-08-03 ENCOUNTER — Other Ambulatory Visit: Payer: Self-pay | Admitting: *Deleted

## 2011-08-03 NOTE — Telephone Encounter (Signed)
Ok to refill 

## 2011-08-05 MED ORDER — ESZOPICLONE 2 MG PO TABS: 2.0000 mg | ORAL_TABLET | Freq: Every day | ORAL | Status: DC

## 2011-08-05 NOTE — Telephone Encounter (Signed)
Please call in.  Thanks.   

## 2011-08-06 NOTE — Telephone Encounter (Signed)
Medication phoned to pharmacy.  

## 2012-01-31 ENCOUNTER — Other Ambulatory Visit: Payer: Self-pay

## 2012-01-31 ENCOUNTER — Other Ambulatory Visit (INDEPENDENT_AMBULATORY_CARE_PROVIDER_SITE_OTHER): Payer: Self-pay | Admitting: General Surgery

## 2012-01-31 DIAGNOSIS — Z1231 Encounter for screening mammogram for malignant neoplasm of breast: Secondary | ICD-10-CM

## 2012-01-31 MED ORDER — ESZOPICLONE 2 MG PO TABS: 2.0000 mg | ORAL_TABLET | Freq: Every day | ORAL | Status: DC

## 2012-01-31 NOTE — Telephone Encounter (Signed)
Medication phoned to pharmacy.  

## 2012-01-31 NOTE — Telephone Encounter (Signed)
Sent!

## 2012-01-31 NOTE — Telephone Encounter (Signed)
[facility] faxed refill request Lunesta 2 mg. Pt last seen 07/13/11.Please advise.

## 2012-02-17 ENCOUNTER — Other Ambulatory Visit: Payer: Self-pay | Admitting: Family Medicine

## 2012-02-17 DIAGNOSIS — E7800 Pure hypercholesterolemia, unspecified: Secondary | ICD-10-CM

## 2012-02-22 ENCOUNTER — Other Ambulatory Visit (INDEPENDENT_AMBULATORY_CARE_PROVIDER_SITE_OTHER): Payer: 59

## 2012-02-22 DIAGNOSIS — E7800 Pure hypercholesterolemia, unspecified: Secondary | ICD-10-CM

## 2012-02-22 LAB — COMPREHENSIVE METABOLIC PANEL
ALT: 9 U/L (ref 0–35)
Albumin: 3.8 g/dL (ref 3.5–5.2)
Alkaline Phosphatase: 84 U/L (ref 39–117)
Glucose, Bld: 93 mg/dL (ref 70–99)
Potassium: 4.2 mEq/L (ref 3.5–5.1)
Sodium: 140 mEq/L (ref 135–145)
Total Bilirubin: 0.6 mg/dL (ref 0.3–1.2)
Total Protein: 6.8 g/dL (ref 6.0–8.3)

## 2012-02-22 LAB — LIPID PANEL
HDL: 38.3 mg/dL — ABNORMAL LOW (ref >39.00)
Total CHOL/HDL Ratio: 4
VLDL: 17.8 mg/dL (ref 0.0–40.0)

## 2012-02-29 ENCOUNTER — Encounter: Payer: Self-pay | Admitting: Family Medicine

## 2012-02-29 ENCOUNTER — Ambulatory Visit (INDEPENDENT_AMBULATORY_CARE_PROVIDER_SITE_OTHER): Payer: 59 | Admitting: Family Medicine

## 2012-02-29 VITALS — BP 102/64 | HR 80 | Temp 98.6°F | Wt 180.0 lb

## 2012-02-29 DIAGNOSIS — Z0000 Encounter for general adult medical examination without abnormal findings: Secondary | ICD-10-CM

## 2012-02-29 DIAGNOSIS — G4700 Insomnia, unspecified: Secondary | ICD-10-CM

## 2012-02-29 DIAGNOSIS — C50919 Malignant neoplasm of unspecified site of unspecified female breast: Secondary | ICD-10-CM

## 2012-02-29 DIAGNOSIS — K5900 Constipation, unspecified: Secondary | ICD-10-CM | POA: Insufficient documentation

## 2012-02-29 NOTE — Assessment & Plan Note (Signed)
Continue prn lunesta.  Good effect, no ADE.

## 2012-02-29 NOTE — Assessment & Plan Note (Signed)
Routine anticipatory guidance given to patient.  See health maintenance. She's cutting back on smoking.  Td due.  We discussed and she declined.  I encouraged her to get this.   Flu shot done at work.   Pap per gynecology.  Mammogram pending, followed at [facility].   Labs discussed and unremarkable.  Exercise: we discussed.  Gradual inc in weight.  She is aware of her schedule demands and packs her lunch.  She's going to cut back on sodas.  D/w patient [id number] for colon cancer screening, including IFOB vs. colonoscopy.  She gets IFOB through gyn.

## 2012-02-29 NOTE — Progress Notes (Signed)
CPE- See plan.  Routine anticipatory guidance given to patient.  See health maintenance. She's cutting back on smoking.  Td due.  We discussed and she declined.  I encouraged her to get this.   Flu shot done at work.   Pap per gynecology.  Mammogram pending, followed at [facility].   Labs discussed and unremarkable.  Exercise: we discussed.  Gradual inc in weight.  She is aware of her schedule demands and packs her lunch.  She's going to cut back on sodas.  D/w patient [id number] for colon cancer screening, including IFOB vs. colonoscopy.  She gets IFOB through gyn.   Work stress continues.  H/o insomnia and lunesta helps.  No ADE from the medicine.  Doing well.   PMH and SH reviewed  Meds, vitals, and allergies reviewed.   ROS: See HPI.  Otherwise negative.    GEN: nad, alert and oriented HEENT: mucous membranes moist NECK: supple w/o LA CV: rrr. PULM: ctab, no inc wob ABD: soft, +bs EXT: no edema SKIN: no acute rash

## 2012-02-29 NOTE — Patient Instructions (Signed)
Don't change your meds and let me know if you have concerns.  Let me know if I can help you stop smoking.  I would think about getting a tetanus shot.

## 2012-02-29 NOTE — Assessment & Plan Note (Signed)
Mammogram pending, followed at [facility].

## 2012-03-04 ENCOUNTER — Ambulatory Visit: Payer: 59

## 2012-03-07 ENCOUNTER — Ambulatory Visit
Admission: RE | Admit: 2012-03-07 | Discharge: 2012-03-07 | Disposition: A | Discharge status: Home / Self Care | Payer: 59 | Source: Ambulatory Visit | Attending: General Surgery | Admitting: General Surgery

## 2012-03-07 DIAGNOSIS — Z1231 Encounter for screening mammogram for malignant neoplasm of breast: Secondary | ICD-10-CM

## 2012-03-10 ENCOUNTER — Encounter (INDEPENDENT_AMBULATORY_CARE_PROVIDER_SITE_OTHER): Payer: Self-pay | Admitting: General Surgery

## 2012-03-13 ENCOUNTER — Encounter (INDEPENDENT_AMBULATORY_CARE_PROVIDER_SITE_OTHER): Payer: Self-pay | Admitting: General Surgery

## 2012-03-13 ENCOUNTER — Ambulatory Visit (INDEPENDENT_AMBULATORY_CARE_PROVIDER_SITE_OTHER): Payer: 59 | Admitting: General Surgery

## 2012-03-13 VITALS — BP 124/82 | HR 80 | Temp 98.2°F | Resp 16 | Ht 68.0 in | Wt 181.8 lb

## 2012-03-13 DIAGNOSIS — Z853 Personal history of malignant neoplasm of breast: Secondary | ICD-10-CM

## 2012-03-13 NOTE — Patient Instructions (Signed)
Continue to do regular self exams 

## 2012-03-18 ENCOUNTER — Encounter (INDEPENDENT_AMBULATORY_CARE_PROVIDER_SITE_OTHER): Payer: Self-pay | Admitting: General Surgery

## 2012-03-18 NOTE — Progress Notes (Signed)
Subjective:     Patient ID: Wendy Pruitt, female   DOB: 1956/01/12, 56 y.o.   MRN: [id number]  HPI The patient is a 56 year old white female who is 9 years out from a left mastectomy for DCIS. It has been about 2 years since we saw her last . She states she has been well during that time. She has no complaints today. She denies any chest wall pain. She did have implant reconstructions.  Review of Systems  Constitutional: Negative.   HENT: Negative.   Eyes: Negative.   Respiratory: Negative.   Cardiovascular: Negative.   Gastrointestinal: Negative.   Genitourinary: Negative.   Musculoskeletal: Negative.   Skin: Negative.   Neurological: Negative.   Hematological: Negative.   Psychiatric/Behavioral: Negative.        Objective:   Physical Exam  Constitutional: She is oriented to person, place, and time. She appears well-developed and well-nourished.  HENT:  Head: Normocephalic and atraumatic.  Eyes: Conjunctivae and EOM are normal. Pupils are equal, round, and reactive to light.  Neck: Normal range of motion. Neck supple.  Cardiovascular: Normal rate, regular rhythm and normal heart sounds.   Pulmonary/Chest: Effort normal and breath sounds normal.       No palpable mass in the left reconstructed breast. No palpable mass in the right breast. No palpable axillary supraclavicular or cervical lymphadenopathy  Abdominal: Soft. Bowel sounds are normal. She exhibits no mass. There is no tenderness.  Musculoskeletal: Normal range of motion.  Lymphadenopathy:    She has no cervical adenopathy.  Neurological: She is alert and oriented to person, place, and time.  Skin: Skin is warm and dry.  Psychiatric: She has a normal mood and affect. Her behavior is normal.       Assessment:     9 years status post left mastectomy for DCIS    Plan:     At this point she will continue to do regular self exams. We will plan to see her back in one year.

## 2012-06-07 ENCOUNTER — Ambulatory Visit: Payer: Self-pay | Admitting: Internal Medicine

## 2012-07-30 ENCOUNTER — Other Ambulatory Visit: Payer: Self-pay | Admitting: Family Medicine

## 2012-07-31 NOTE — Telephone Encounter (Signed)
Electronic refill request

## 2012-08-01 NOTE — Telephone Encounter (Signed)
Medication phoned to pharmacy.  

## 2012-08-01 NOTE — Telephone Encounter (Signed)
Please call in

## 2012-08-29 ENCOUNTER — Encounter: Payer: Self-pay | Admitting: Family Medicine

## 2012-08-29 ENCOUNTER — Ambulatory Visit (INDEPENDENT_AMBULATORY_CARE_PROVIDER_SITE_OTHER): Payer: 59 | Admitting: Family Medicine

## 2012-08-29 VITALS — BP 114/62 | HR 88 | Temp 98.4°F | Wt 184.0 lb

## 2012-08-29 DIAGNOSIS — R059 Cough, unspecified: Secondary | ICD-10-CM

## 2012-08-29 DIAGNOSIS — R05 Cough: Secondary | ICD-10-CM

## 2012-08-29 MED ORDER — ALBUTEROL SULFATE HFA 108 (90 BASE) MCG/ACT IN AERS: 2.0000 | INHALATION_SPRAY | Freq: Four times a day (QID) | RESPIRATORY_TRACT | Status: DC | PRN

## 2012-08-29 NOTE — Patient Instructions (Signed)
Use the inhaler every 4-6 hours if needed for cough.  Take the OTC alka seltzer for the runny nose.  This should gradually get better.  Take care.

## 2012-08-29 NOTE — Progress Notes (Signed)
Cutting back to ~1/2 PPD.  Had a flu shot.    Sick with a head cold for the last few days.  Now with a cough and chest sx.  Ears are itching and burning.  Coughing, sneezing, sniffing.  Mult sick contacts.  No fevers known.  Some sputum.  Clear rhinorrhea.  She thought she had some possible wheezing.  Cough is throughout the day.  Taking OTC cold medicine.    Meds, vitals, and allergies reviewed.   ROS: See HPI.  Otherwise, noncontributory.  GEN: nad, alert and oriented HEENT: mucous membranes moist, tm w/o erythema but SOM noted, nasal exam w/o erythema, clear discharge noted,  OP with cobblestoning NECK: supple w/o LA CV: rrr.   PULM: completely ctab, no inc wob EXT: no edema SKIN: no acute rash

## 2012-08-31 DIAGNOSIS — R059 Cough, unspecified: Secondary | ICD-10-CM | POA: Insufficient documentation

## 2012-08-31 DIAGNOSIS — R05 Cough: Secondary | ICD-10-CM | POA: Insufficient documentation

## 2012-08-31 NOTE — Assessment & Plan Note (Signed)
Likely viral, lungs completely Ctab and nontoxic.  Would not use abx in this situation, esp given the exam.  Use the inhaler every 4-6 hours if needed for cough. Take the OTC alka seltzer for the runny nose. Should improve.

## 2013-01-16 ENCOUNTER — Encounter: Payer: Self-pay | Admitting: Family Medicine

## 2013-01-16 ENCOUNTER — Ambulatory Visit (INDEPENDENT_AMBULATORY_CARE_PROVIDER_SITE_OTHER): Payer: 59 | Admitting: Family Medicine

## 2013-01-16 VITALS — BP 122/82 | HR 82 | Temp 98.3°F

## 2013-01-16 DIAGNOSIS — J020 Streptococcal pharyngitis: Secondary | ICD-10-CM

## 2013-01-16 DIAGNOSIS — J069 Acute upper respiratory infection, unspecified: Secondary | ICD-10-CM

## 2013-01-16 LAB — POCT RAPID STREP A (OFFICE): Rapid Strep A Screen: NEGATIVE

## 2013-01-16 MED ORDER — FLUTICASONE PROPIONATE 50 MCG/ACT NA SUSP: 2.0000 | Freq: Every day | NASAL | Status: DC

## 2013-01-16 NOTE — Assessment & Plan Note (Signed)
Likely viral, with ETD component.  Reassured, would use flonase and salt water gargles.  RST neg.  Tick today may be caffeine related and she'll monitor this.  Encouraged to cut back on caffeine.

## 2013-01-16 NOTE — Progress Notes (Signed)
All sx worse in the last 1-2 days.  No fevers known. Pain is worse on L side of throat.  L ear pain noted.  Minimal rhinorrhea.  Not stuffy. No cough.  Trying to quit smoking.  Fatigued. No rash.    She has a head tick today, has had several cups of caffeine. She can make it stop.  No hand tremor.    Meds, vitals, and allergies reviewed.   ROS: See HPI.  Otherwise, noncontributory.  GEN: nad, alert and oriented HEENT: mucous membranes moist, tm w/o erythema, nasal exam w/o erythema, clear discharge noted,  OP with cobblestoning NECK: supple w/o LA CV: rrr.   PULM: ctab, no inc wob EXT: no edema SKIN: no acute rash

## 2013-01-16 NOTE — Patient Instructions (Signed)
Use the flonase in each nostril daily.  Drink plenty of fluids, take tylenol as needed, and gargle with warm salt water for your throat.  This should gradually improve.  Take care.  Let us know if you have other concerns.

## 2013-02-09 ENCOUNTER — Other Ambulatory Visit: Payer: Self-pay | Admitting: Family Medicine

## 2013-02-09 NOTE — Telephone Encounter (Signed)
LMOVM of home number.

## 2013-02-09 NOTE — Telephone Encounter (Signed)
Sent, have her schedule a visit to talk about insomnia.  Thanks.

## 2013-02-11 NOTE — Telephone Encounter (Signed)
Pt left v/m requesting call back [phone].

## 2013-02-11 NOTE — Telephone Encounter (Signed)
Medicine called to pharmacy, advised patient.  Call transferred to front desk to schedule physical.

## 2013-02-26 ENCOUNTER — Other Ambulatory Visit: Payer: Self-pay

## 2013-02-26 DIAGNOSIS — Z9012 Acquired absence of left breast and nipple: Secondary | ICD-10-CM

## 2013-02-26 DIAGNOSIS — Z1231 Encounter for screening mammogram for malignant neoplasm of breast: Secondary | ICD-10-CM

## 2013-03-10 ENCOUNTER — Ambulatory Visit: Payer: 59

## 2013-03-19 ENCOUNTER — Ambulatory Visit
Admission: RE | Admit: 2013-03-19 | Discharge: 2013-03-19 | Disposition: A | Discharge status: Home / Self Care | Payer: 59 | Source: Ambulatory Visit

## 2013-03-19 DIAGNOSIS — Z1231 Encounter for screening mammogram for malignant neoplasm of breast: Secondary | ICD-10-CM

## 2013-03-19 DIAGNOSIS — Z9012 Acquired absence of left breast and nipple: Secondary | ICD-10-CM

## 2013-03-20 ENCOUNTER — Encounter: Payer: Self-pay | Admitting: *Deleted

## 2013-04-01 ENCOUNTER — Encounter (INDEPENDENT_AMBULATORY_CARE_PROVIDER_SITE_OTHER): Payer: Self-pay | Admitting: General Surgery

## 2013-04-01 ENCOUNTER — Ambulatory Visit (INDEPENDENT_AMBULATORY_CARE_PROVIDER_SITE_OTHER): Payer: 59 | Admitting: General Surgery

## 2013-04-01 VITALS — BP 110/64 | HR 76 | Resp 14 | Ht 68.0 in | Wt 174.8 lb

## 2013-04-01 DIAGNOSIS — Z853 Personal history of malignant neoplasm of breast: Secondary | ICD-10-CM

## 2013-04-01 NOTE — Progress Notes (Signed)
Subjective:     Patient ID: Wendy Pruitt, female   DOB: 1956-04-24, 57 y.o.   MRN: [id number]  HPI The patient is a 57 year old white female who is 10 years status post left mastectomy with Reconstruction for DCIS. She has done well since her last appointment. She has had no major medical problems. She denies any chest wall pain. She recently had a mammogram of the right breast that showed no evidence of malignancy.  Review of Systems  Constitutional: Negative.   HENT: Negative.   Eyes: Negative.   Respiratory: Negative.   Cardiovascular: Negative.   Gastrointestinal: Negative.   Endocrine: Negative.   Genitourinary: Negative.   Musculoskeletal: Negative.   Skin: Negative.   Allergic/Immunologic: Negative.   Neurological: Negative.   Hematological: Negative.   Psychiatric/Behavioral: Negative.        Objective:   Physical Exam  Constitutional: She is oriented to person, place, and time. She appears well-developed and well-nourished.  HENT:  Head: Normocephalic and atraumatic.  Eyes: Conjunctivae and EOM are normal. Pupils are equal, round, and reactive to light.  Neck: Normal range of motion. Neck supple.  Cardiovascular: Normal rate, regular rhythm and normal heart sounds.   Pulmonary/Chest: Effort normal and breath sounds normal.  There is no palpable mass of the left reconstructed breast. There is no palpable mass of the right breast. There is no palpable axillary or supraclavicular cervical lymphadenopathy.  Abdominal: Soft. Bowel sounds are normal. She exhibits no mass. There is no tenderness.  Musculoskeletal: Normal range of motion.  Lymphadenopathy:    She has no cervical adenopathy.  Neurological: She is alert and oriented to person, place, and time.  Skin: Skin is warm and dry.  Psychiatric: She has a normal mood and affect. Her behavior is normal.       Assessment:     The patient is 10 years status post left mastectomy with Reconstruction for DCIS      Plan:     At this point she will continue to do regular self exams. We will plan to see her back in one year.

## 2013-04-01 NOTE — Patient Instructions (Signed)
Continue regular self exams  

## 2013-04-07 ENCOUNTER — Other Ambulatory Visit: Payer: Self-pay | Admitting: Family Medicine

## 2013-04-07 DIAGNOSIS — E7800 Pure hypercholesterolemia, unspecified: Secondary | ICD-10-CM

## 2013-04-13 ENCOUNTER — Other Ambulatory Visit: Payer: 59

## 2013-04-17 ENCOUNTER — Encounter: Payer: 59 | Admitting: Family Medicine

## 2013-04-29 ENCOUNTER — Other Ambulatory Visit (INDEPENDENT_AMBULATORY_CARE_PROVIDER_SITE_OTHER): Payer: 59

## 2013-04-29 DIAGNOSIS — E7800 Pure hypercholesterolemia, unspecified: Secondary | ICD-10-CM

## 2013-04-29 LAB — BASIC METABOLIC PANEL
BUN: 15 mg/dL (ref 6–23)
CO2: 28 mEq/L (ref 19–32)
Calcium: 9.3 mg/dL (ref 8.4–10.5)
GFR: 74.8 mL/min (ref >60.00)
Glucose, Bld: 95 mg/dL (ref 70–99)
Potassium: 4.5 mEq/L (ref 3.5–5.1)

## 2013-04-29 LAB — LIPID PANEL
Total CHOL/HDL Ratio: 5
VLDL: 18.2 mg/dL (ref 0.0–40.0)

## 2013-05-11 ENCOUNTER — Encounter: Payer: Self-pay | Admitting: Family Medicine

## 2013-05-11 ENCOUNTER — Ambulatory Visit (INDEPENDENT_AMBULATORY_CARE_PROVIDER_SITE_OTHER): Payer: 59 | Admitting: Family Medicine

## 2013-05-11 VITALS — BP 112/70 | HR 68 | Temp 98.0°F | Ht 68.0 in | Wt 173.0 lb

## 2013-05-11 DIAGNOSIS — G4700 Insomnia, unspecified: Secondary | ICD-10-CM

## 2013-05-11 DIAGNOSIS — F17200 Nicotine dependence, unspecified, uncomplicated: Secondary | ICD-10-CM

## 2013-05-11 DIAGNOSIS — Z853 Personal history of malignant neoplasm of breast: Secondary | ICD-10-CM

## 2013-05-11 DIAGNOSIS — Z0000 Encounter for general adult medical examination without abnormal findings: Secondary | ICD-10-CM

## 2013-05-11 MED ORDER — ESZOPICLONE 2 MG PO TABS: 2.0000 mg | ORAL_TABLET | Freq: Every day | ORAL | Status: DC

## 2013-05-11 NOTE — Assessment & Plan Note (Signed)
Continue lunesta 2mg  qhs.  No ADE. Call back when needing refill.

## 2013-05-11 NOTE — Patient Instructions (Addendum)
When you are checking out today, as the front about getting the records sent over from the GYN clinic.   Send the stool cards back to the GYN clinic.  I would talk with your family about your wishes.  If you get a living will done, we can scan it in.  Take care.  Glad to see you.

## 2013-05-11 NOTE — Progress Notes (Signed)
CPE- See plan.  Routine anticipatory guidance given to patient.  See health maintenance. She is smoking less, using a vapor cigarette.  Pap and mammogram up to date.   tdap declined. Discussed and encouraged.  Flu shot prev done.  I thanked her about that.   Stool cards prev done by GYN.   Living will d/w pt.  She'll talk to her kids about this, about her preferences.   D/w pt about diet and exercise.  "doing pretty good."  She lost 10 lbs intentionally.    Insomnia.  Controlled usually with lunesta, if taking 2mg  at night.  D/w pt about options.     PMH and SH reviewed   Meds, vitals, and allergies reviewed.   ROS: See HPI.  Otherwise negative.    GEN: nad, alert and oriented HEENT: mucous membranes moist NECK: supple w/o LA CV: rrr. PULM: ctab, no inc wob ABD: soft, +bs EXT: no edema SKIN: no acute rash

## 2013-05-11 NOTE — Assessment & Plan Note (Signed)
She is working on cessation, using E cig.

## 2013-05-11 NOTE — Assessment & Plan Note (Signed)
Routine anticipatory guidance given to patient.  See health maintenance. She is smoking less, using a vapor cigarette.  Pap and mammogram up to date.   tdap declined. Discussed and encouraged.  Flu shot prev done.  I thanked her about that.   Stool cards prev done by GYN.   Living will d/w pt.  She'll talk to her kids about this, about her preferences.   D/w pt about diet and exercise.  "doing pretty good."  She lost 10 lbs intentionally.

## 2013-05-11 NOTE — Assessment & Plan Note (Signed)
Followed by GYN and surgery clinic.

## 2013-05-27 ENCOUNTER — Encounter: Payer: Self-pay | Admitting: Obstetrics and Gynecology

## 2013-06-19 ENCOUNTER — Emergency Department: Payer: Self-pay | Admitting: Emergency Medicine

## 2013-06-19 LAB — CBC
HCT: 39.6 % (ref 35.0–47.0)
HGB: 13.5 g/dL (ref 12.0–16.0)
MCH: 30.1 pg (ref 26.0–34.0)
MCHC: 34 g/dL (ref 32.0–36.0)
MCV: 88 fL (ref 80–100)
RBC: 4.49 10*6/uL (ref 3.80–5.20)
RDW: 13.4 % (ref 11.5–14.5)
WBC: 13.9 10*3/uL — ABNORMAL HIGH (ref 3.6–11.0)

## 2013-06-19 LAB — COMPREHENSIVE METABOLIC PANEL
Alkaline Phosphatase: 130 U/L (ref 50–136)
Anion Gap: 4 — ABNORMAL LOW (ref 7–16)
BUN: 19 mg/dL — ABNORMAL HIGH (ref 7–18)
Calcium, Total: 9 mg/dL (ref 8.5–10.1)
Creatinine: 1.03 mg/dL (ref 0.60–1.30)
EGFR (Non-African Amer.): 59 — ABNORMAL LOW
SGOT(AST): 19 U/L (ref 15–37)
Total Protein: 7.2 g/dL (ref 6.4–8.2)

## 2013-06-22 ENCOUNTER — Telehealth: Payer: Self-pay | Admitting: Family Medicine

## 2013-06-22 NOTE — Telephone Encounter (Signed)
Spoke with patient.  She says she went to the ER at [facility] and waited 3 hours and never received any care except they drew some blood.  She said she was continually vomiting and asked for a bed because she was so weak but they said there were other people before her and they didn't have a bed.  She finally left and went home to her own bed.  She felt some better Saturday and Sunday and is at work today.  She has not heard anything from the labs that were drawn.

## 2013-06-22 NOTE — Telephone Encounter (Signed)
Faxed for records

## 2013-06-22 NOTE — Telephone Encounter (Signed)
Please see about getting any records.

## 2013-06-22 NOTE — Telephone Encounter (Signed)
Please call and get update on patient. Thanks.  

## 2013-06-22 NOTE — Telephone Encounter (Signed)
I'll review, thanks.

## 2013-06-22 NOTE — Telephone Encounter (Signed)
[facility] Triage Call Report Triage Record Num: [id number] Operator: Ether Griffins Patient Name: Wendy Pruitt Call Date & Time: 06/19/2013 8:31:00PM Patient Phone: [phone] PCP: Crawford Givens Patient Gender: Female PCP Fax : Patient DOB: [birth year] Practice Name: [facility] Reason for Call: Caller: Bonnie/Sibling; PCP: Crawford Givens; CB#: [phone]; Calling about severe pain in rt side that goes across abdomen and vomiting. Pain rated "10".Onset 06/19/13 @ 1730. Guideline: Abdominal Pain. Disposition: Activate EMS 911. Reason for Disposition: Over 31 years of age AND new onset unbearable back or abdominal pain. Advised to hang up and call 911. Protocol(s) Used: Abdominal Pain Recommended Outcome per Protocol: Activate EMS 911 Reason for Outcome: Over 59 years of age AND new onset (first episode) unbearable back or abdominal pain Care Advice: ~ IMMEDIATE ACTION An adult should stay with the patient, preferably one trained in CPR. If the person is not trained in CPR, then he or she should provide hands-only (compression-only) CPR as recommended by the American [employer] Association. ~ 06/19/2013 8:39:56PM Page 1 of 1 [email or web address]

## 2013-06-22 NOTE — Telephone Encounter (Signed)
Records arrived and placed in In Box.

## 2013-08-06 ENCOUNTER — Other Ambulatory Visit: Payer: Self-pay | Admitting: Family Medicine

## 2013-08-06 NOTE — Telephone Encounter (Signed)
Please call in.  Thanks.   

## 2013-08-06 NOTE — Telephone Encounter (Signed)
Electronic refill request.  Please advise. 

## 2013-08-07 NOTE — Telephone Encounter (Signed)
Medication phoned to pharmacy.  

## 2013-09-22 ENCOUNTER — Encounter: Payer: Self-pay | Admitting: Family Medicine

## 2013-09-22 ENCOUNTER — Ambulatory Visit (INDEPENDENT_AMBULATORY_CARE_PROVIDER_SITE_OTHER): Payer: 59 | Admitting: Family Medicine

## 2013-09-22 VITALS — BP 142/72 | HR 98 | Temp 98.3°F | Wt 175.0 lb

## 2013-09-22 DIAGNOSIS — Z6379 Other stressful life events affecting family and household: Secondary | ICD-10-CM

## 2013-09-22 MED ORDER — ALPRAZOLAM 0.25 MG PO TABS: 0.1250 mg | ORAL_TABLET | Freq: Two times a day (BID) | ORAL | Status: DC | PRN

## 2013-09-22 NOTE — Assessment & Plan Note (Signed)
Use prn xanax with sedation caution and if needed frequently we can add on SSRI or similar.  D/w pt. She agrees.  Okay for outpatient f/u. She'll call back as needed.   D/w pt about basic infection prevention given the daughter's pending chemo, ie hand washing, already had a flu shot.

## 2013-09-22 NOTE — Patient Instructions (Signed)
Take the xanax if needed.  It can make you drowsy.  If you need it often then let me know.  We may need to change your meds around otherwise. Take care.

## 2013-09-22 NOTE — Progress Notes (Signed)
Pre-visit discussion using our clinic review tool. No additional management support is needed unless otherwise documented below in the visit note.  Anxiety and tearfulness.  Daughter has breast cancer.  Her daughter has two small kids. "I'm trying to help but some days I'm falling apart."  Daughter starts chemo next week.  Pt's son in law is helping out.  "I need to be as healthy as I can."  She can get tearful suddenly.  Not sleeping well, less than normal.  "Worried to death."    Meds, vitals, and allergies reviewed.   ROS: See HPI.  Otherwise, noncontributory.  nad but tearful rrr ctab No tremor Speech wnl

## 2013-09-25 ENCOUNTER — Encounter: Payer: Self-pay | Admitting: Family Medicine

## 2013-09-25 ENCOUNTER — Ambulatory Visit (INDEPENDENT_AMBULATORY_CARE_PROVIDER_SITE_OTHER): Payer: 59 | Admitting: Family Medicine

## 2013-09-25 DIAGNOSIS — H6590 Unspecified nonsuppurative otitis media, unspecified ear: Secondary | ICD-10-CM

## 2013-09-25 NOTE — Patient Instructions (Signed)
Get some rest and gently try to pop your ears after a hot shower.

## 2013-09-25 NOTE — Progress Notes (Signed)
Pre-visit discussion using our clinic review tool. No additional management support is needed unless otherwise documented below in the visit note.  She went for the wig fitting for her daughter (who has cancer) and that was a tough but  helpful event.  She hasn't had to use the xanax yet.    L ear sx. Cracking and popping, feels full, some pain below the L ear.  No FCNAVD.  No rhinorrhea, no ST.    Meds, vitals, and allergies reviewed.   ROS: See HPI.  Otherwise, noncontributory.  GEN: nad, alert and oriented HEENT: mucous membranes moist, tm w/o erythema but B SOM noted, nasal exam w/o erythema, clear discharge noted,  OP with cobblestoning NECK: supple w/o LA CV: rrr.   PULM: ctab, no inc wob EXT: no edema SKIN: no acute rash

## 2013-09-27 DIAGNOSIS — H6590 Unspecified nonsuppurative otitis media, unspecified ear: Secondary | ICD-10-CM | POA: Insufficient documentation

## 2013-09-27 NOTE — Assessment & Plan Note (Signed)
Could be viral or allergic, nontoxic. D/w pt about precautions re: possible illness, ie hand washing and f/u prn. Rest, fluids, gently try to pop her ears.  She has delayed TM movement at OV.

## 2014-02-08 ENCOUNTER — Other Ambulatory Visit: Payer: Self-pay | Admitting: Family Medicine

## 2014-02-08 NOTE — Telephone Encounter (Signed)
Electronic refill request. Last OV 09/05/13.  Last filled:  30 tablets with 5 RF on 08/06/2013

## 2014-02-08 NOTE — Telephone Encounter (Signed)
Please call in

## 2014-02-09 NOTE — Telephone Encounter (Signed)
Medication phoned to pharmacy.  

## 2014-02-10 ENCOUNTER — Other Ambulatory Visit: Payer: Self-pay

## 2014-02-10 DIAGNOSIS — Z1231 Encounter for screening mammogram for malignant neoplasm of breast: Secondary | ICD-10-CM

## 2014-02-10 DIAGNOSIS — Z9012 Acquired absence of left breast and nipple: Secondary | ICD-10-CM

## 2014-03-22 ENCOUNTER — Ambulatory Visit
Admission: RE | Admit: 2014-03-22 | Discharge: 2014-03-22 | Disposition: A | Discharge status: Home / Self Care | Payer: 59 | Source: Ambulatory Visit

## 2014-03-22 DIAGNOSIS — Z1231 Encounter for screening mammogram for malignant neoplasm of breast: Secondary | ICD-10-CM

## 2014-03-22 DIAGNOSIS — Z9012 Acquired absence of left breast and nipple: Secondary | ICD-10-CM

## 2014-04-05 ENCOUNTER — Ambulatory Visit (INDEPENDENT_AMBULATORY_CARE_PROVIDER_SITE_OTHER): Payer: 59 | Admitting: General Surgery

## 2014-04-05 ENCOUNTER — Encounter (INDEPENDENT_AMBULATORY_CARE_PROVIDER_SITE_OTHER): Payer: Self-pay | Admitting: General Surgery

## 2014-04-05 VITALS — BP 122/80 | HR 76 | Temp 97.5°F | Ht 68.0 in | Wt 181.0 lb

## 2014-04-05 DIAGNOSIS — Z853 Personal history of malignant neoplasm of breast: Secondary | ICD-10-CM

## 2014-04-05 NOTE — Progress Notes (Signed)
Subjective:     Patient ID: Wendy Pruitt, female   DOB: 12/19/1955, 58 y.o.   MRN: [id number]  HPI The patient is a 58 year old white female who is 11 years status post left mastectomy and sentinel node mapping with Reconstruction for DCIS. She feels well and has had no major medical illnesses during the last year. Recently her daughter was diagnosed with breast cancer and by her report was negative for the BRCA gene. Her recent mammogram of her right breast showed no evidence of malignancy. The patient is clearly still smoking.  Review of Systems  Constitutional: Negative.   HENT: Negative.   Eyes: Negative.   Respiratory: Negative.   Cardiovascular: Negative.   Gastrointestinal: Negative.   Endocrine: Negative.   Genitourinary: Negative.   Musculoskeletal: Negative.   Skin: Negative.   Allergic/Immunologic: Negative.   Neurological: Negative.   Hematological: Negative.   Psychiatric/Behavioral: Negative.        Objective:   Physical Exam  Constitutional: She is oriented to person, place, and time. She appears well-developed and well-nourished.  HENT:  Head: Normocephalic and atraumatic.  Eyes: Conjunctivae and EOM are normal. Pupils are equal, round, and reactive to light.  Neck: Normal range of motion. Neck supple.  Cardiovascular: Normal rate, regular rhythm and normal heart sounds.   Pulmonary/Chest: Effort normal and breath sounds normal.  There is no palpable mass in either breast. There is no palpable axillary, supraclavicular, or cervical lymphadenopathy.  Abdominal: Soft. Bowel sounds are normal.  Musculoskeletal: Normal range of motion.  Lymphadenopathy:    She has no cervical adenopathy.  Neurological: She is alert and oriented to person, place, and time.  Skin: Skin is warm and dry.  Psychiatric: She has a normal mood and affect. Her behavior is normal.       Assessment:     The patient is 11 years status post left mastectomy for DCIS with reconstruction      Plan:     At this point she will continue to do regular self exams. I will plan to see her back in one year.

## 2014-04-05 NOTE — Patient Instructions (Signed)
Continue regular self exams  

## 2014-07-08 ENCOUNTER — Encounter: Payer: Self-pay | Admitting: Family Medicine

## 2014-07-08 ENCOUNTER — Ambulatory Visit (INDEPENDENT_AMBULATORY_CARE_PROVIDER_SITE_OTHER): Payer: 59 | Admitting: Family Medicine

## 2014-07-08 VITALS — BP 108/64 | HR 99 | Temp 98.5°F | Ht 68.0 in | Wt 168.2 lb

## 2014-07-08 DIAGNOSIS — J069 Acute upper respiratory infection, unspecified: Secondary | ICD-10-CM

## 2014-07-08 MED ORDER — AMOXICILLIN-POT CLAVULANATE 875-125 MG PO TABS: 1.0000 | ORAL_TABLET | Freq: Two times a day (BID) | ORAL | Status: DC

## 2014-07-08 NOTE — Patient Instructions (Signed)
Reschedule your physical for a convenient time.  Use the flonase for now and try to get some rest.  Drink plenty of fluids and finish the macrobid for the UTI. If you still have facial pain next week, then start augmentin.  I think you won't need it since this is likely a virus.  Take care.  Hope you feel better soon.

## 2014-07-08 NOTE — Progress Notes (Signed)
Pre visit review using our clinic review tool, if applicable. No additional management support is needed unless otherwise documented below in the visit note.  Scheduled for CPE.  Tabled given recent illness.   Recently dx'd with UTI, [facility]  Started on macrobid and pyridium in the meantime.   Since then, last night, URI sx started.  No fevers known.  No vomiting.  Appetite is still good.  No ear pain.  Stuffy.  Irritated throat.  Minimal cough.  Minimal sputum, none today.  Chest isn't sore.  Maxillary pain, burning and hurting on the face.  Fatigued.   Daughter is done with surgery/chemo/radiation for cancer.  Daughter is doing well.    Meds, vitals, and allergies reviewed.   ROS: See HPI.  Otherwise, noncontributory.  GEN: nad, alert and oriented HEENT: mucous membranes moist, tm w/o erythema, nasal exam w/o erythema, clear discharge noted,  OP with cobblestoning, sinuses ttp x4 NECK: supple w/o LA CV: rrr.   PULM: ctab, no inc wob EXT: no edema SKIN: no acute rash

## 2014-07-09 DIAGNOSIS — J069 Acute upper respiratory infection, unspecified: Secondary | ICD-10-CM | POA: Insufficient documentation

## 2014-07-09 NOTE — Assessment & Plan Note (Signed)
Likely viral, nontoxic, hold augmentin for now.  If continued facial pain, then start the abx and f/u prn. She agrees.  Supportive tx for now.

## 2014-07-13 ENCOUNTER — Telehealth: Payer: Self-pay

## 2014-07-13 NOTE — Telephone Encounter (Signed)
Left vm with instructions listed below.

## 2014-07-13 NOTE — Telephone Encounter (Signed)
Pt left v/m;pt was seen 07/08/14 with sinusitis;pt taking last macrodantin today but congestion has moved down into chest; prod cough with clear and yellow phlegm. Pt is presently taking Mucinex. No wheezing, S/T,fever or SOB. Pt wants to know if should start taking Augmentin today or what should pt do.[facility]. Pt request cb.

## 2014-07-13 NOTE — Telephone Encounter (Signed)
I would start the augmentin.  Thanks.

## 2014-08-09 ENCOUNTER — Other Ambulatory Visit: Payer: Self-pay | Admitting: Family Medicine

## 2014-08-09 NOTE — Telephone Encounter (Signed)
Received refill request electronically from pharmacy. Last refill 02/08/14 #30/5, last office visit 07/08/14/ acute. Is it okay to refill medication?

## 2014-08-10 NOTE — Telephone Encounter (Signed)
Rx called to pharmacy

## 2014-08-10 NOTE — Telephone Encounter (Signed)
Okay, please call in.  Thanks.

## 2014-08-25 ENCOUNTER — Other Ambulatory Visit: Payer: Self-pay | Admitting: Family Medicine

## 2014-08-25 DIAGNOSIS — Z8249 Family history of ischemic heart disease and other diseases of the circulatory system: Secondary | ICD-10-CM

## 2014-09-01 ENCOUNTER — Other Ambulatory Visit (INDEPENDENT_AMBULATORY_CARE_PROVIDER_SITE_OTHER): Payer: 59

## 2014-09-01 DIAGNOSIS — Z0000 Encounter for general adult medical examination without abnormal findings: Secondary | ICD-10-CM

## 2014-09-01 DIAGNOSIS — Z8249 Family history of ischemic heart disease and other diseases of the circulatory system: Secondary | ICD-10-CM

## 2014-09-01 LAB — LIPID PANEL
CHOL/HDL RATIO: 4
CHOLESTEROL: 154 mg/dL (ref 0–200)
HDL: 39.8 mg/dL (ref >39.00)
LDL CALC: 95 mg/dL (ref 0–99)
NonHDL: 114.2
Triglycerides: 96 mg/dL (ref 0.0–149.0)
VLDL: 19.2 mg/dL (ref 0.0–40.0)

## 2014-09-01 LAB — BASIC METABOLIC PANEL
BUN: 21 mg/dL (ref 6–23)
CALCIUM: 9.4 mg/dL (ref 8.4–10.5)
CHLORIDE: 105 meq/L (ref 96–112)
CO2: 32 meq/L (ref 19–32)
CREATININE: 0.9 mg/dL (ref 0.4–1.2)
GFR: 68.7 mL/min (ref >60.00)
Glucose, Bld: 85 mg/dL (ref 70–99)
Potassium: 4.3 mEq/L (ref 3.5–5.1)
Sodium: 141 mEq/L (ref 135–145)

## 2014-09-07 ENCOUNTER — Ambulatory Visit (INDEPENDENT_AMBULATORY_CARE_PROVIDER_SITE_OTHER): Payer: 59 | Admitting: Family Medicine

## 2014-09-07 ENCOUNTER — Encounter: Payer: Self-pay | Admitting: Family Medicine

## 2014-09-07 DIAGNOSIS — Z0000 Encounter for general adult medical examination without abnormal findings: Secondary | ICD-10-CM

## 2014-09-07 DIAGNOSIS — Z7189 Other specified counseling: Secondary | ICD-10-CM

## 2014-09-07 DIAGNOSIS — G4700 Insomnia, unspecified: Secondary | ICD-10-CM

## 2014-09-07 DIAGNOSIS — Z1211 Encounter for screening for malignant neoplasm of colon: Secondary | ICD-10-CM

## 2014-09-07 NOTE — Progress Notes (Signed)
Pre visit review using our clinic review tool, if applicable. No additional management support is needed unless otherwise documented below in the visit note.  CPE- See plan.  Routine anticipatory guidance given to patient.  See health maintenance. Tetanus shot d/w pt.  Last done >10 years ago.  Declined. Flu done at work.  Done about 1 month ago.  Shingles encouraged.  D/w pt.   PNA shot d/w pt. Pelvic per gyn.  Mammogram last done this year.  DXA not due yet.  Living will d/w pt.  Would have her daughter Joya Salm if patient were incapacitated.  Diet and exercise d/w pt.   Weight up slightly recently.  D/w pt and diet and exercise. D/w patient [id number] for colon cancer screening, including IFOB vs. colonoscopy.  Risks and benefits of both were discussed and patient voiced understanding.  Pt elects for: IFOB.   Insomnia.  Some help from the med. No ADE.  Sleeping better with the medicine.  No parasomnias.    PMH and SH reviewed  Meds, vitals, and allergies reviewed.   ROS: See HPI.  Otherwise negative.    GEN: nad, alert and oriented HEENT: mucous membranes moist NECK: supple w/o LA CV: rrr. PULM: ctab, no inc wob ABD: soft, +bs EXT: no edema SKIN: no acute rash

## 2014-09-07 NOTE — Patient Instructions (Addendum)
Check with your insurance to see if they will cover the shingles shot. Go to the lab on the way out.  We'll contact you with your lab report. Take care.  Glad to see you.  

## 2014-09-08 ENCOUNTER — Telehealth: Payer: Self-pay | Admitting: Family Medicine

## 2014-09-08 DIAGNOSIS — Z7189 Other specified counseling: Secondary | ICD-10-CM | POA: Insufficient documentation

## 2014-09-08 NOTE — Telephone Encounter (Signed)
Pt dropped off [facility] insurance form to be completed/signed by Dr. Damita Dunnings.  I have placed this in Lugene's in-box.  Please pass on to Dr. Damita Dunnings. Thank you.

## 2014-09-08 NOTE — Telephone Encounter (Signed)
emmi emailed °

## 2014-09-08 NOTE — Telephone Encounter (Signed)
I'll work on the hard copy when I can.

## 2014-09-08 NOTE — Assessment & Plan Note (Signed)
Controlled with current meds, continue as is.  Overall doing well.

## 2014-09-08 NOTE — Assessment & Plan Note (Addendum)
Routine anticipatory guidance given to patient.  See health maintenance. Tetanus shot d/w pt.  Last done >10 years ago.  Declined. Flu done at work.  Done about 1 month ago.  Shingles encouraged.  D/w pt.   PNA shot d/w pt. Pelvic per gyn.  Mammogram last done this year.  DXA not due yet.  Living will d/w pt.  Would have her daughter Joya Salm if patient were incapacitated.  Diet and exercise d/w pt.   Weight up slightly recently.  D/w pt and diet and exercise. D/w patient [id number] for colon cancer screening, including IFOB vs. colonoscopy.  Risks and benefits of both were discussed and patient voiced understanding.  Pt elects for: IFOB.  Labs d/w pt.  Wnl.

## 2014-09-08 NOTE — Telephone Encounter (Signed)
On your desk

## 2015-02-02 ENCOUNTER — Other Ambulatory Visit: Payer: Self-pay | Admitting: Family Medicine

## 2015-02-02 NOTE — Telephone Encounter (Signed)
Lunesta refill request.  Last seen 09/07/2014 last filled 08/20/2014.  Please advise.

## 2015-02-03 NOTE — Telephone Encounter (Signed)
Please call in.  Thanks.   

## 2015-02-03 NOTE — Telephone Encounter (Signed)
Medication phoned to pharmacy.  

## 2015-03-02 ENCOUNTER — Other Ambulatory Visit: Payer: Self-pay

## 2015-03-02 DIAGNOSIS — Z1231 Encounter for screening mammogram for malignant neoplasm of breast: Secondary | ICD-10-CM

## 2015-03-29 ENCOUNTER — Ambulatory Visit
Admission: RE | Admit: 2015-03-29 | Discharge: 2015-03-29 | Disposition: A | Discharge status: Home / Self Care | Payer: 59 | Source: Ambulatory Visit

## 2015-03-29 DIAGNOSIS — Z1231 Encounter for screening mammogram for malignant neoplasm of breast: Secondary | ICD-10-CM

## 2015-03-30 ENCOUNTER — Encounter: Payer: Self-pay | Admitting: *Deleted

## 2015-07-29 ENCOUNTER — Other Ambulatory Visit: Payer: Self-pay | Admitting: Family Medicine

## 2015-08-01 NOTE — Telephone Encounter (Signed)
Refill requested 9/23.  Last filled #30 with 5 refills on 3/31.  Dr. Damita Dunnings is out of the office.  Okay to refill?

## 2015-08-02 ENCOUNTER — Other Ambulatory Visit: Payer: Self-pay | Admitting: Family Medicine

## 2015-08-02 NOTE — Telephone Encounter (Signed)
Called to pharmacy.  Patient notified.  

## 2015-08-02 NOTE — Telephone Encounter (Signed)
Patient calling to check status of Lunesta refill.  Called to pharmacy.

## 2015-08-30 ENCOUNTER — Other Ambulatory Visit: Payer: Self-pay | Admitting: Family Medicine

## 2015-08-30 NOTE — Telephone Encounter (Signed)
Received refill request electronically' Last refill 08/01/15 #30 Last office visit 09/07/14 Is it okay to refill medication?

## 2015-08-31 NOTE — Telephone Encounter (Signed)
Rx called to pharmacy as instructed. 

## 2015-08-31 NOTE — Telephone Encounter (Signed)
Please call in.  Thanks.   

## 2015-10-10 ENCOUNTER — Other Ambulatory Visit: Payer: Self-pay | Admitting: Family Medicine

## 2015-10-10 DIAGNOSIS — R739 Hyperglycemia, unspecified: Secondary | ICD-10-CM

## 2015-10-12 ENCOUNTER — Other Ambulatory Visit (INDEPENDENT_AMBULATORY_CARE_PROVIDER_SITE_OTHER): Payer: Commercial Managed Care - HMO

## 2015-10-12 DIAGNOSIS — R739 Hyperglycemia, unspecified: Secondary | ICD-10-CM | POA: Diagnosis not present

## 2015-10-12 LAB — BASIC METABOLIC PANEL
BUN: 20 mg/dL (ref 6–23)
CHLORIDE: 106 meq/L (ref 96–112)
CO2: 30 mEq/L (ref 19–32)
Calcium: 9.3 mg/dL (ref 8.4–10.5)
Creatinine, Ser: 0.8 mg/dL (ref 0.40–1.20)
GFR: 77.4 mL/min (ref >60.00)
Glucose, Bld: 90 mg/dL (ref 70–99)
POTASSIUM: 4.2 meq/L (ref 3.5–5.1)
SODIUM: 142 meq/L (ref 135–145)

## 2015-10-12 LAB — LIPID PANEL
CHOLESTEROL: 142 mg/dL (ref 0–200)
HDL: 35.8 mg/dL — AB (ref >39.00)
LDL Cholesterol: 89 mg/dL (ref 0–99)
NonHDL: 105.99
TRIGLYCERIDES: 86 mg/dL (ref 0.0–149.0)
Total CHOL/HDL Ratio: 4
VLDL: 17.2 mg/dL (ref 0.0–40.0)

## 2015-10-14 ENCOUNTER — Encounter: Payer: Self-pay | Admitting: Family Medicine

## 2015-10-14 ENCOUNTER — Ambulatory Visit (INDEPENDENT_AMBULATORY_CARE_PROVIDER_SITE_OTHER): Payer: Commercial Managed Care - HMO | Admitting: Family Medicine

## 2015-10-14 ENCOUNTER — Other Ambulatory Visit: Payer: Self-pay | Admitting: Family Medicine

## 2015-10-14 VITALS — BP 118/60 | HR 86 | Temp 98.6°F | Wt 169.2 lb

## 2015-10-14 DIAGNOSIS — Z1211 Encounter for screening for malignant neoplasm of colon: Secondary | ICD-10-CM

## 2015-10-14 DIAGNOSIS — G4700 Insomnia, unspecified: Secondary | ICD-10-CM

## 2015-10-14 DIAGNOSIS — Z0000 Encounter for general adult medical examination without abnormal findings: Secondary | ICD-10-CM

## 2015-10-14 DIAGNOSIS — Z119 Encounter for screening for infectious and parasitic diseases, unspecified: Secondary | ICD-10-CM

## 2015-10-14 MED ORDER — ESZOPICLONE 2 MG PO TABS: ORAL_TABLET | ORAL | Status: DC

## 2015-10-14 NOTE — Patient Instructions (Addendum)
Go to the lab on the way out.  We'll contact you with your lab report (stool cards).  Check with your insurance to see if they will cover the shingles shot. I would check on getting a tetanus shot.   Take care.  Glad to see you.  Keep working on your weight.  Let me know I can help you stop smoking.

## 2015-10-14 NOTE — Assessment & Plan Note (Signed)
Some help from the med. No ADE. Sleeping better with the medicine. No parasomnias.  Continue as is.  Doing well.   rx given to patient at [facility].

## 2015-10-14 NOTE — Progress Notes (Signed)
Pre visit review using our clinic review tool, if applicable. No additional management support is needed unless otherwise documented below in the visit note.  CPE- See plan.  Routine anticipatory guidance given to patient.  See health maintenance. Tetanus shot d/w pt. encouraged, declined.  Flu done at work. Done 08/2015 Shingles encouraged. D/w pt.  PNA shot d/w pt. Pelvic per gyn. d/w pt.  She'll call about f/u.   Mammogram done 03/2015 DXA not due yet.  Living will d/w pt. Would have her daughter Joya Salm if patient were incapacitated.  Diet and exercise d/w pt. D/w pt and diet and exercise.  Working on diet, esp at work.  Encouraged exercise.   D/w patient [id number] for colon cancer screening, including IFOB vs. colonoscopy. Risks and benefits of both were discussed and patient voiced understanding. Pt elects for: IFOB.  D/w pt about tobacco cessation.   Pt opts in for HCV and HIV screening with next set of labs.  D/w pt re: routine screening.     Insomnia. Some help from the med. No ADE. Sleeping better with the medicine. No parasomnias.   PMH and SH reviewed  Meds, vitals, and allergies reviewed.   ROS: See HPI.  Otherwise negative.    GEN: nad, alert and oriented HEENT: mucous membranes moist NECK: supple w/o LA CV: rrr. PULM: ctab, no inc wob ABD: soft, +bs EXT: no edema

## 2015-10-14 NOTE — Assessment & Plan Note (Signed)
Tetanus shot d/w pt. encouraged, declined.  Flu done at work. Done 08/2015  Shingles encouraged. D/w pt.  PNA shot d/w pt.  Pelvic per gyn. d/w pt. She'll call about f/u.  Mammogram done 03/2015  DXA not due yet.  Living will d/w pt. Would have her daughter Joya Salm if patient were incapacitated.  Diet and exercise d/w pt. D/w pt and diet and exercise. Working on diet, esp at work. Encouraged exercise.  D/w patient [id number] for colon cancer screening, including IFOB vs. colonoscopy. Risks and benefits of both were discussed and patient voiced understanding. Pt elects for: IFOB.  D/w pt about tobacco cessation.  Precontemplative.

## 2015-10-28 ENCOUNTER — Other Ambulatory Visit: Payer: Self-pay | Admitting: Family Medicine

## 2016-04-17 ENCOUNTER — Other Ambulatory Visit: Payer: Self-pay | Admitting: Family Medicine

## 2016-04-17 NOTE — Telephone Encounter (Signed)
Received refill electronically Last refill 10/14/15 #30/5 refills Last office visit 10/14/15

## 2016-04-18 NOTE — Telephone Encounter (Signed)
Rx called to pharmacy as instructed. 

## 2016-04-18 NOTE — Telephone Encounter (Signed)
Please call in.  Thanks.   

## 2016-05-04 ENCOUNTER — Ambulatory Visit (INDEPENDENT_AMBULATORY_CARE_PROVIDER_SITE_OTHER): Payer: Commercial Managed Care - HMO | Admitting: Family Medicine

## 2016-05-04 ENCOUNTER — Encounter: Payer: Self-pay | Admitting: Family Medicine

## 2016-05-04 VITALS — BP 120/72 | HR 82 | Temp 98.6°F | Ht 68.0 in | Wt 172.8 lb

## 2016-05-04 DIAGNOSIS — J069 Acute upper respiratory infection, unspecified: Secondary | ICD-10-CM | POA: Diagnosis not present

## 2016-05-04 NOTE — Progress Notes (Signed)
Pre visit review using our clinic review tool, if applicable. No additional management support is needed unless otherwise documented below in the visit note. 

## 2016-05-04 NOTE — Assessment & Plan Note (Signed)
Symptoms most consistent by viral upper respiratory infection. Possible bronchitis as well. She is improving overall. No focal findings indicate bacterial illness. Lung exam mildly coarse and I offered her an albuterol inhaler to see if this would be beneficial though she deferred this opting to continue to monitor. She's given return precautions.

## 2016-05-04 NOTE — Progress Notes (Signed)
Patient ID: Wendy Pruitt, female   DOB: Oct 20, 1956, 60 y.o.   MRN: [id number]  Tommi Rumps, MD Phone: [phone]  Wendy Pruitt is a 60 y.o. female who presents today for same-day visit.  Patient notes cough and congestion for several days starting with sinus congestion and nasal congestion. Then developed throat and chest congestion. Notes cough is productive of some greenish yellowish mucus though this is improving. She feels better today. No sinus or nasal congestion now. No shortness of breath. No wheezing. She reports cough does not keep her up at night.  PMH: Smoker   ROS see history of present illness  Objective  Physical Exam Filed Vitals:   05/04/16 1553  BP: 120/72  Pulse: 82  Temp: 98.6 F (37 C)    BP Readings from Last 3 Encounters:  05/04/16 120/72  10/14/15 118/60  09/07/14 100/60   Wt Readings from Last 3 Encounters:  05/04/16 172 lb 12.8 oz (78.382 kg)  10/14/15 169 lb 4 oz (76.771 kg)  09/07/14 170 lb (77.111 kg)    Physical Exam  Constitutional: She is well-developed, well-nourished, and in no distress.  HENT:  Head: Normocephalic and atraumatic.  Right Ear: External ear normal.  Left Ear: External ear normal.  Mouth/Throat: Oropharynx is clear and moist. No oropharyngeal exudate.  Normal TMs bilaterally  Eyes: Conjunctivae are normal. Pupils are equal, round, and reactive to light.  Neck: Neck supple.  Cardiovascular: Normal rate, regular rhythm and normal heart sounds.   Pulmonary/Chest: Effort normal. No respiratory distress. She has no wheezes. She has no rales.  Mildly coarse breath sounds throughout  Lymphadenopathy:    She has no cervical adenopathy.  Neurological: She is alert. Gait normal.  Skin: Skin is warm and dry. She is not diaphoretic.     Assessment/Plan: Please see individual problem list.  Viral upper respiratory infection Symptoms most consistent by viral upper respiratory infection. Possible bronchitis as well.  She is improving overall. No focal findings indicate bacterial illness. Lung exam mildly coarse and I offered her an albuterol inhaler to see if this would be beneficial though she deferred this opting to continue to monitor. She's given return precautions.   Tommi Rumps, MD [place]

## 2016-05-04 NOTE — Patient Instructions (Addendum)
Nice to meet you. Your symptoms are likely related to a viral illness. They should continue to get better. You can try over-the-counter Claritin for any congestion or ear congestion. If you develop chest pain, shortness of breath, cough productive of blood, fevers, or any new or changing symptoms please seek medical attention.

## 2016-05-16 ENCOUNTER — Other Ambulatory Visit: Payer: Self-pay | Admitting: General Surgery

## 2016-05-16 DIAGNOSIS — Z139 Encounter for screening, unspecified: Secondary | ICD-10-CM

## 2016-05-29 ENCOUNTER — Ambulatory Visit
Admission: RE | Admit: 2016-05-29 | Discharge: 2016-05-29 | Disposition: A | Discharge status: Home / Self Care | Payer: Commercial Managed Care - HMO | Source: Ambulatory Visit | Attending: General Surgery | Admitting: General Surgery

## 2016-05-29 DIAGNOSIS — Z139 Encounter for screening, unspecified: Secondary | ICD-10-CM

## 2016-10-07 ENCOUNTER — Other Ambulatory Visit: Payer: Self-pay | Admitting: Family Medicine

## 2016-10-07 DIAGNOSIS — E786 Lipoprotein deficiency: Secondary | ICD-10-CM

## 2016-10-12 ENCOUNTER — Other Ambulatory Visit (INDEPENDENT_AMBULATORY_CARE_PROVIDER_SITE_OTHER): Payer: Commercial Managed Care - HMO

## 2016-10-12 ENCOUNTER — Other Ambulatory Visit: Payer: Self-pay | Admitting: Family Medicine

## 2016-10-12 DIAGNOSIS — E786 Lipoprotein deficiency: Secondary | ICD-10-CM | POA: Diagnosis not present

## 2016-10-12 DIAGNOSIS — Z119 Encounter for screening for infectious and parasitic diseases, unspecified: Secondary | ICD-10-CM | POA: Diagnosis not present

## 2016-10-12 LAB — BASIC METABOLIC PANEL
BUN: 20 mg/dL (ref 6–23)
CALCIUM: 9.5 mg/dL (ref 8.4–10.5)
CHLORIDE: 105 meq/L (ref 96–112)
CO2: 30 meq/L (ref 19–32)
Creatinine, Ser: 0.92 mg/dL (ref 0.40–1.20)
GFR: 65.66 mL/min (ref >60.00)
GLUCOSE: 104 mg/dL — AB (ref 70–99)
Potassium: 4.4 mEq/L (ref 3.5–5.1)
SODIUM: 142 meq/L (ref 135–145)

## 2016-10-12 LAB — LIPID PANEL
CHOL/HDL RATIO: 4
Cholesterol: 174 mg/dL (ref 0–200)
HDL: 43.3 mg/dL (ref >39.00)
LDL CALC: 112 mg/dL — AB (ref 0–99)
NONHDL: 130.64
TRIGLYCERIDES: 94 mg/dL (ref 0.0–149.0)
VLDL: 18.8 mg/dL (ref 0.0–40.0)

## 2016-10-13 LAB — HEPATITIS C ANTIBODY: HCV AB: NEGATIVE

## 2016-10-13 LAB — HIV ANTIBODY (ROUTINE TESTING W REFLEX): HIV 1&2 Ab, 4th Generation: NONREACTIVE

## 2016-10-15 ENCOUNTER — Encounter: Payer: Self-pay | Admitting: Family Medicine

## 2016-10-15 ENCOUNTER — Ambulatory Visit (INDEPENDENT_AMBULATORY_CARE_PROVIDER_SITE_OTHER): Payer: Commercial Managed Care - HMO | Admitting: Family Medicine

## 2016-10-15 VITALS — BP 110/66 | HR 73 | Temp 98.7°F | Wt 170.8 lb

## 2016-10-15 DIAGNOSIS — Z0000 Encounter for general adult medical examination without abnormal findings: Secondary | ICD-10-CM | POA: Diagnosis not present

## 2016-10-15 DIAGNOSIS — G4700 Insomnia, unspecified: Secondary | ICD-10-CM

## 2016-10-15 DIAGNOSIS — Z1211 Encounter for screening for malignant neoplasm of colon: Secondary | ICD-10-CM

## 2016-10-15 MED ORDER — ESZOPICLONE 2 MG PO TABS: ORAL_TABLET | ORAL | 5 refills | Status: DC

## 2016-10-15 MED ORDER — VARENICLINE TARTRATE 1 MG PO TABS: 1.0000 mg | ORAL_TABLET | Freq: Two times a day (BID) | ORAL | 1 refills | Status: DC

## 2016-10-15 MED ORDER — VARENICLINE TARTRATE 0.5 MG X 11 & 1 MG X 42 PO MISC: ORAL | 0 refills | Status: DC

## 2016-10-15 NOTE — Progress Notes (Signed)
Pre visit review using our clinic review tool, if applicable. No additional management support is needed unless otherwise documented below in the visit note. 

## 2016-10-15 NOTE — Patient Instructions (Addendum)
Check with your insurance to see if they will cover the shingles shot. Go to the lab on the way out.  We'll contact you with your lab report (stool cards). Update me as needed.   Start chantix in the meantime.  If you have troubles, then stop the med and notify us.  Take care.  Glad to see you.

## 2016-10-15 NOTE — Progress Notes (Signed)
CPE- See plan.  Routine anticipatory guidance given to patient.  See health maintenance. Tetanus shot d/w pt. encouraged, declined.  Flu done at work. Done 08/2016 Shingles encouraged. D/w pt.  PNA shot d/w pt.  Pelvic per gyn. d/w pt. She'll call about f/u. She agrees.   Mammogram done 05/2016 DXA not due yet.  Living will d/w pt. Would have her daughter Joya Salm if patient were incapacitated.  D/w pt and diet and exercise. Encouraged exercise. She is snacking more at night.   Labs d/w pt.   D/w patient [id number] for colon cancer screening, including IFOB vs. colonoscopy. Risks and benefits of both were discussed and patient voiced understanding. Pt elects for: IFOB.  D/w pt about tobacco cessation.  Contemplative now, dw pt.  She is smoking more since she has been working part time.  She failed wellbutrin and pacthes prev.  D/w pt about chantix trial.  Routine cautions d/w pt about chantix.    Insomnia.  D/w pt.  No ADE on med. Compliant.  No parasomnias.    PMH and SH reviewed  Meds, vitals, and allergies reviewed.   ROS: Per HPI.  Unless specifically indicated otherwise in HPI, the patient denies:  General: fever. Eyes: acute vision changes ENT: sore throat Cardiovascular: chest pain Respiratory: SOB GI: vomiting GU: dysuria Musculoskeletal: acute back pain Derm: acute rash Neuro: acute motor dysfunction Psych: worsening mood Endocrine: polydipsia Heme: bleeding Allergy: hayfever  GEN: nad, alert and oriented HEENT: mucous membranes moist NECK: supple w/o LA CV: rrr. PULM: ctab, no inc wob ABD: soft, +bs EXT: no edema SKIN: no acute rash

## 2016-10-16 NOTE — Assessment & Plan Note (Signed)
D/w pt.  No ADE on med. Compliant.  No parasomnias.   Continue as is.

## 2016-10-16 NOTE — Assessment & Plan Note (Addendum)
Tetanus shot d/w pt. encouraged, declined.  Flu done at work. Done 08/2016 Shingles encouraged. D/w pt.  PNA shot d/w pt.  Pelvic per gyn. d/w pt. She'll call about f/u. She agrees.   Mammogram done 05/2016 DXA not due yet.  Living will d/w pt. Would have her daughter Joya Salm if patient were incapacitated.  Diet and exercise d/w pt.   Encouraged exercise. She is snacking more at night.   D/w patient [id number] for colon cancer screening, including IFOB vs. colonoscopy. Risks and benefits of both were discussed and patient voiced understanding. Pt elects for: IFOB.  D/w pt about tobacco cessation.  Contemplative now, dw pt.  She is smoking more since she has been working part time.  She failed wellbutrin and pacthes prev.  D/w pt about chantix trial.  Routine cautions d/w pt about chantix.

## 2016-10-24 ENCOUNTER — Other Ambulatory Visit (INDEPENDENT_AMBULATORY_CARE_PROVIDER_SITE_OTHER): Payer: Commercial Managed Care - HMO

## 2016-10-24 DIAGNOSIS — Z1211 Encounter for screening for malignant neoplasm of colon: Secondary | ICD-10-CM

## 2016-10-24 LAB — FECAL OCCULT BLOOD, IMMUNOCHEMICAL: Fecal Occult Bld: NEGATIVE

## 2017-01-24 DIAGNOSIS — L720 Epidermal cyst: Secondary | ICD-10-CM | POA: Diagnosis not present

## 2017-01-24 DIAGNOSIS — L820 Inflamed seborrheic keratosis: Secondary | ICD-10-CM | POA: Diagnosis not present

## 2017-01-31 DIAGNOSIS — Z961 Presence of intraocular lens: Secondary | ICD-10-CM | POA: Diagnosis not present

## 2017-01-31 DIAGNOSIS — H00025 Hordeolum internum left lower eyelid: Secondary | ICD-10-CM | POA: Diagnosis not present

## 2017-01-31 DIAGNOSIS — H02055 Trichiasis without entropian left lower eyelid: Secondary | ICD-10-CM | POA: Diagnosis not present

## 2017-03-12 DIAGNOSIS — H40013 Open angle with borderline findings, low risk, bilateral: Secondary | ICD-10-CM | POA: Diagnosis not present

## 2017-03-12 DIAGNOSIS — D3132 Benign neoplasm of left choroid: Secondary | ICD-10-CM | POA: Diagnosis not present

## 2017-03-12 DIAGNOSIS — H2512 Age-related nuclear cataract, left eye: Secondary | ICD-10-CM | POA: Diagnosis not present

## 2017-03-15 DIAGNOSIS — H401113 Primary open-angle glaucoma, right eye, severe stage: Secondary | ICD-10-CM | POA: Diagnosis not present

## 2017-03-15 DIAGNOSIS — H401121 Primary open-angle glaucoma, left eye, mild stage: Secondary | ICD-10-CM | POA: Diagnosis not present

## 2017-03-15 DIAGNOSIS — H40032 Anatomical narrow angle, left eye: Secondary | ICD-10-CM | POA: Diagnosis not present

## 2017-03-18 ENCOUNTER — Other Ambulatory Visit: Payer: Self-pay | Admitting: Family Medicine

## 2017-03-18 DIAGNOSIS — Z1231 Encounter for screening mammogram for malignant neoplasm of breast: Secondary | ICD-10-CM

## 2017-03-18 DIAGNOSIS — Z9012 Acquired absence of left breast and nipple: Secondary | ICD-10-CM

## 2017-04-03 ENCOUNTER — Other Ambulatory Visit: Payer: Self-pay | Admitting: Family Medicine

## 2017-04-03 NOTE — Telephone Encounter (Signed)
Last office visit 10/15/2016.  Last refilled 10/15/2016 for #30 with 5 refills.  Ok to refill?

## 2017-04-03 NOTE — Telephone Encounter (Signed)
Please call in.  Thanks.   

## 2017-04-04 NOTE — Telephone Encounter (Signed)
Medication phoned to pharmacy.  

## 2017-04-19 DIAGNOSIS — H401113 Primary open-angle glaucoma, right eye, severe stage: Secondary | ICD-10-CM | POA: Diagnosis not present

## 2017-04-19 DIAGNOSIS — H401121 Primary open-angle glaucoma, left eye, mild stage: Secondary | ICD-10-CM | POA: Diagnosis not present

## 2017-04-19 DIAGNOSIS — H2512 Age-related nuclear cataract, left eye: Secondary | ICD-10-CM | POA: Diagnosis not present

## 2017-05-30 ENCOUNTER — Ambulatory Visit
Admission: RE | Admit: 2017-05-30 | Discharge: 2017-05-30 | Disposition: A | Discharge status: Home / Self Care | Payer: Commercial Managed Care - HMO | Source: Ambulatory Visit | Attending: Family Medicine | Admitting: Family Medicine

## 2017-05-30 ENCOUNTER — Encounter: Payer: Self-pay | Admitting: *Deleted

## 2017-05-30 DIAGNOSIS — Z1231 Encounter for screening mammogram for malignant neoplasm of breast: Secondary | ICD-10-CM | POA: Diagnosis not present

## 2017-05-30 DIAGNOSIS — Z9012 Acquired absence of left breast and nipple: Secondary | ICD-10-CM

## 2017-08-28 DIAGNOSIS — H401113 Primary open-angle glaucoma, right eye, severe stage: Secondary | ICD-10-CM | POA: Diagnosis not present

## 2017-08-28 DIAGNOSIS — H2512 Age-related nuclear cataract, left eye: Secondary | ICD-10-CM | POA: Diagnosis not present

## 2017-08-28 DIAGNOSIS — H401121 Primary open-angle glaucoma, left eye, mild stage: Secondary | ICD-10-CM | POA: Diagnosis not present

## 2017-09-30 DIAGNOSIS — H401113 Primary open-angle glaucoma, right eye, severe stage: Secondary | ICD-10-CM | POA: Diagnosis not present

## 2017-09-30 DIAGNOSIS — H2512 Age-related nuclear cataract, left eye: Secondary | ICD-10-CM | POA: Diagnosis not present

## 2017-09-30 DIAGNOSIS — H01021 Squamous blepharitis right upper eyelid: Secondary | ICD-10-CM | POA: Diagnosis not present

## 2017-10-15 ENCOUNTER — Other Ambulatory Visit: Payer: Self-pay | Admitting: Family Medicine

## 2017-10-15 ENCOUNTER — Other Ambulatory Visit: Payer: Commercial Managed Care - HMO

## 2017-10-15 DIAGNOSIS — Z8249 Family history of ischemic heart disease and other diseases of the circulatory system: Secondary | ICD-10-CM

## 2017-10-15 DIAGNOSIS — H04221 Epiphora due to insufficient drainage, right lacrimal gland: Secondary | ICD-10-CM | POA: Diagnosis not present

## 2017-10-15 DIAGNOSIS — H04521 Eversion of right lacrimal punctum: Secondary | ICD-10-CM | POA: Diagnosis not present

## 2017-10-15 DIAGNOSIS — H02132 Senile ectropion of right lower eyelid: Secondary | ICD-10-CM | POA: Diagnosis not present

## 2017-10-17 ENCOUNTER — Other Ambulatory Visit (INDEPENDENT_AMBULATORY_CARE_PROVIDER_SITE_OTHER): Payer: 59

## 2017-10-17 DIAGNOSIS — Z8249 Family history of ischemic heart disease and other diseases of the circulatory system: Secondary | ICD-10-CM | POA: Diagnosis not present

## 2017-10-17 LAB — BASIC METABOLIC PANEL
BUN: 16 mg/dL (ref 6–23)
CALCIUM: 9.3 mg/dL (ref 8.4–10.5)
CHLORIDE: 104 meq/L (ref 96–112)
CO2: 32 meq/L (ref 19–32)
CREATININE: 0.85 mg/dL (ref 0.40–1.20)
GFR: 71.7 mL/min (ref >60.00)
GLUCOSE: 98 mg/dL (ref 70–99)
Potassium: 4.1 mEq/L (ref 3.5–5.1)
Sodium: 140 mEq/L (ref 135–145)

## 2017-10-17 LAB — LIPID PANEL
CHOLESTEROL: 167 mg/dL (ref 0–200)
HDL: 43.6 mg/dL (ref >39.00)
LDL Cholesterol: 106 mg/dL — ABNORMAL HIGH (ref 0–99)
NONHDL: 123.4
TRIGLYCERIDES: 87 mg/dL (ref 0.0–149.0)
Total CHOL/HDL Ratio: 4
VLDL: 17.4 mg/dL (ref 0.0–40.0)

## 2017-10-18 ENCOUNTER — Ambulatory Visit (INDEPENDENT_AMBULATORY_CARE_PROVIDER_SITE_OTHER): Payer: 59 | Admitting: Family Medicine

## 2017-10-18 ENCOUNTER — Encounter: Payer: Self-pay | Admitting: Family Medicine

## 2017-10-18 DIAGNOSIS — G4700 Insomnia, unspecified: Secondary | ICD-10-CM | POA: Diagnosis not present

## 2017-10-18 DIAGNOSIS — H409 Unspecified glaucoma: Secondary | ICD-10-CM

## 2017-10-18 MED ORDER — ESZOPICLONE 2 MG PO TABS: ORAL_TABLET | ORAL | 5 refills | Status: DC

## 2017-10-18 NOTE — Patient Instructions (Addendum)
Reschedule the physical when possible.  You don't need labs ahead of time.  Take care.  Glad to see you.  Update me as needed.   Don't change your meds.   I'll await updates from the eye clinic.

## 2017-10-18 NOTE — Progress Notes (Signed)
She was late to the [facility] but I offered to see her and see what we could get done.   CPE tabled.    R eye complaints.  She was dx'd with glaucoma, on tx in the meantime.  She had been scrubbing the R orbit/etc not gently wiping the area.  She has been using bacitracin in the meantime.  She has f/u pending.  The local irritation is improved some. I'll defer to the eye clinic.  She is seeing Dr. Katy Fitch.    Insomnia.  Still taking lunesta.  That is helping some, getting about 6-7 hour of sleep.  No parasomnias.    No FCNAVD.    Her daughter is back to working full time, doing well after breast cancer.    She didn't tolerate chantix.  D/w pt.  Still smoking.  D/w pt.    Meds, vitals, and allergies reviewed.   ROS: Per HPI unless specifically indicated in ROS section   GEN: nad, alert and oriented HEENT: mucous membranes moist, R orbit with local irritation, inferior to the eye.   NECK: supple w/o LA CV: rrr. PULM: ctab, no inc wob ABD: soft, +bs EXT: no edema SKIN: no acute rash

## 2017-10-20 DIAGNOSIS — H409 Unspecified glaucoma: Secondary | ICD-10-CM | POA: Insufficient documentation

## 2017-10-20 NOTE — Assessment & Plan Note (Signed)
She had been rubbing under the right eye, inferior portion of the orbit, too much and that caused local irritation.  This is healing now.  I will defer to ophthalmology otherwise.

## 2017-10-20 NOTE — Assessment & Plan Note (Signed)
Still taking lunesta.  That is helping some, getting about 6-7 hour of sleep.  No parasomnias.   Continue as is.  No ADE on med.

## 2017-11-06 DIAGNOSIS — H10413 Chronic giant papillary conjunctivitis, bilateral: Secondary | ICD-10-CM | POA: Diagnosis not present

## 2017-11-06 DIAGNOSIS — H02822 Cysts of right lower eyelid: Secondary | ICD-10-CM | POA: Diagnosis not present

## 2017-11-06 DIAGNOSIS — H02102 Unspecified ectropion of right lower eyelid: Secondary | ICD-10-CM | POA: Diagnosis not present

## 2017-11-12 ENCOUNTER — Other Ambulatory Visit: Payer: Self-pay | Admitting: Ophthalmology

## 2017-11-12 DIAGNOSIS — H01132 Eczematous dermatitis of right lower eyelid: Secondary | ICD-10-CM | POA: Diagnosis not present

## 2017-11-12 DIAGNOSIS — D23112 Other benign neoplasm of skin of right lower eyelid, including canthus: Secondary | ICD-10-CM | POA: Diagnosis not present

## 2017-11-12 DIAGNOSIS — L919 Hypertrophic disorder of the skin, unspecified: Secondary | ICD-10-CM | POA: Diagnosis not present

## 2017-12-02 ENCOUNTER — Ambulatory Visit: Payer: 59 | Admitting: Family Medicine

## 2017-12-02 ENCOUNTER — Encounter: Payer: Self-pay | Admitting: Family Medicine

## 2017-12-02 VITALS — BP 102/70 | HR 85 | Temp 98.7°F | Ht 69.0 in | Wt 165.0 lb

## 2017-12-02 DIAGNOSIS — Z1211 Encounter for screening for malignant neoplasm of colon: Secondary | ICD-10-CM

## 2017-12-02 DIAGNOSIS — H409 Unspecified glaucoma: Secondary | ICD-10-CM

## 2017-12-02 DIAGNOSIS — Z0000 Encounter for general adult medical examination without abnormal findings: Secondary | ICD-10-CM

## 2017-12-02 DIAGNOSIS — Z7189 Other specified counseling: Secondary | ICD-10-CM

## 2017-12-02 NOTE — Progress Notes (Signed)
CPE- See plan.  Routine anticipatory guidance given to patient.  See health maintenance.  The possibility exists that previously documented standard health maintenance information may have been brought forward from a previous encounter into this note.  If needed, that same information has been updated to reflect the current situation based on today's encounter.    Tetanus encouraged.   Flu prev done at work.   PNA not due Shingles d/w pt.  We don't have it in stock.  D/w pt.  Pap d/w pt- defer to gyn clinic.  I asked her to f/u about that.   Mammogram 2018.  Up to date.   DXA- defer to 20.   Living will d/w pt.  Would have her daughter Joya Salm if patient were incapacitated.  Diet and exercise d/w pt.  "not as good a I want to" with winter weather.  D/w pt about options for exercise.  She didn't tolerate chantix.   Still smoking.  D/w pt.   Prev labs d/w pt.   D/w patient [id number] for colon cancer screening, including IFOB vs. colonoscopy. Risks and benefits of both were discussed and patient voiced understanding. Pt elects for: IFOB.   She is still seeing Dr. Katy Fitch about her eye in the meantime.  I asked her to f/u with eye clinic in the meantime with her questions.  Local irritation is improved since she is not scrubbing her eyelids as much.  PMH and SH reviewed  Meds, vitals, and allergies reviewed.   ROS: Per HPI.  Unless specifically indicated otherwise in HPI, the patient denies:  General: fever. Eyes: acute vision changes ENT: sore throat Cardiovascular: chest pain Respiratory: SOB GI: vomiting GU: dysuria Musculoskeletal: acute back pain Derm: acute rash Neuro: acute motor dysfunction Psych: worsening mood Endocrine: polydipsia Heme: bleeding Allergy: hayfever  GEN: nad, alert and oriented HEENT: mucous membranes moist, R upper eyelid minimally puffy but not red- clearly looks improved from prev visit.  D/w pt- I will defer to the eye clinic NECK: supple w/o  LA CV: rrr. PULM: ctab, no inc wob ABD: soft, +bs EXT: no edema

## 2017-12-02 NOTE — Patient Instructions (Addendum)
When you get a chance, call gynecology.   Go to the lab on the way out.  We'll contact you with your lab report. Thanks for getting a flu shot.  Take care.  Glad to see you.

## 2017-12-03 NOTE — Assessment & Plan Note (Signed)
Living will d/w pt. Would have her daughter Kim designated if patient were incapacitated.  

## 2017-12-03 NOTE — Assessment & Plan Note (Signed)
Tetanus encouraged.   Flu prev done at work.   PNA not due Shingles d/w pt.  We don't have it in stock.  D/w pt.  Pap d/w pt- defer to gyn clinic.  I asked her to f/u about that.   Mammogram 2018.  Up to date.   DXA- defer to 55.   Living will d/w pt.  Would have her daughter Joya Salm if patient were incapacitated.  Diet and exercise d/w pt.  "not as good a I want to" with winter weather.  D/w pt about options for exercise.  She didn't tolerate chantix.   Still smoking.  D/w pt.   Prev labs d/w pt.   D/w patient [id number] for colon cancer screening, including IFOB vs. colonoscopy. Risks and benefits of both were discussed and patient voiced understanding. Pt elects for: IFOB.

## 2017-12-03 NOTE — Assessment & Plan Note (Signed)
Per eye clinic.

## 2017-12-09 DIAGNOSIS — L308 Other specified dermatitis: Secondary | ICD-10-CM | POA: Diagnosis not present

## 2017-12-09 DIAGNOSIS — L239 Allergic contact dermatitis, unspecified cause: Secondary | ICD-10-CM | POA: Diagnosis not present

## 2017-12-23 ENCOUNTER — Other Ambulatory Visit (INDEPENDENT_AMBULATORY_CARE_PROVIDER_SITE_OTHER): Payer: 59

## 2017-12-23 DIAGNOSIS — Z1211 Encounter for screening for malignant neoplasm of colon: Secondary | ICD-10-CM | POA: Diagnosis not present

## 2017-12-23 LAB — FECAL OCCULT BLOOD, IMMUNOCHEMICAL: FECAL OCCULT BLD: NEGATIVE

## 2017-12-25 DIAGNOSIS — L308 Other specified dermatitis: Secondary | ICD-10-CM | POA: Diagnosis not present

## 2017-12-27 DIAGNOSIS — L308 Other specified dermatitis: Secondary | ICD-10-CM | POA: Diagnosis not present

## 2018-01-08 DIAGNOSIS — Z130 Encounter for screening for diseases of the blood and blood-forming organs and certain disorders involving the immune mechanism: Secondary | ICD-10-CM | POA: Diagnosis not present

## 2018-01-08 DIAGNOSIS — Z1389 Encounter for screening for other disorder: Secondary | ICD-10-CM | POA: Diagnosis not present

## 2018-01-08 DIAGNOSIS — Z01419 Encounter for gynecological examination (general) (routine) without abnormal findings: Secondary | ICD-10-CM | POA: Diagnosis not present

## 2018-02-03 DIAGNOSIS — L578 Other skin changes due to chronic exposure to nonionizing radiation: Secondary | ICD-10-CM | POA: Diagnosis not present

## 2018-02-03 DIAGNOSIS — L820 Inflamed seborrheic keratosis: Secondary | ICD-10-CM | POA: Diagnosis not present

## 2018-02-03 DIAGNOSIS — Z1283 Encounter for screening for malignant neoplasm of skin: Secondary | ICD-10-CM | POA: Diagnosis not present

## 2018-02-03 DIAGNOSIS — D485 Neoplasm of uncertain behavior of skin: Secondary | ICD-10-CM | POA: Diagnosis not present

## 2018-02-03 DIAGNOSIS — L280 Lichen simplex chronicus: Secondary | ICD-10-CM | POA: Diagnosis not present

## 2018-02-04 ENCOUNTER — Encounter (HOSPITAL_BASED_OUTPATIENT_CLINIC_OR_DEPARTMENT_OTHER): Payer: Self-pay | Admitting: *Deleted

## 2018-02-04 ENCOUNTER — Other Ambulatory Visit: Payer: Self-pay

## 2018-02-04 NOTE — Progress Notes (Signed)
SPOKE W/ PT VIA PHONE FOR PRE-OP INTERVIEW.  GETTING CBC DONE Friday 02-07-2018 @ 1000.  WILL DO EYE DROPS AS USUAL.

## 2018-02-07 ENCOUNTER — Encounter (HOSPITAL_COMMUNITY)
Admission: RE | Admit: 2018-02-07 | Discharge: 2018-02-07 | Disposition: A | Discharge status: Home / Self Care | Payer: 59 | Source: Ambulatory Visit | Attending: Obstetrics and Gynecology | Admitting: Obstetrics and Gynecology

## 2018-02-07 DIAGNOSIS — N901 Moderate vulvar dysplasia: Secondary | ICD-10-CM | POA: Insufficient documentation

## 2018-02-07 LAB — CBC
HCT: 41.4 % (ref 36.0–46.0)
HEMOGLOBIN: 13.8 g/dL (ref 12.0–15.0)
MCH: 30.7 pg (ref 26.0–34.0)
MCHC: 33.3 g/dL (ref 30.0–36.0)
MCV: 92.2 fL (ref 78.0–100.0)
PLATELETS: 315 10*3/uL (ref 150–400)
RBC: 4.49 MIL/uL (ref 3.87–5.11)
RDW: 13.9 % (ref 11.5–15.5)
WBC: 7.4 10*3/uL (ref 4.0–10.5)

## 2018-02-10 NOTE — H&P (Signed)
Wendy Pruitt is an 62 y.o. female. She was seen for her annual gyn exam on March 6, had an asymptomatic skin lesion at 11:00 on her vulva.  Subsequent biopsy c/w VIN 2 so admitted for removal of the lesion.  Pertinent Gynecological History: Last mammogram: normal Date: 05/2017 OB History: G2, P2002, SVD x 2   Menstrual History: No LMP recorded. Patient has had a hysterectomy.    Past Medical History:  Diagnosis Date  . Glaucoma, both eyes   . History of left breast cancer 2004--- per pt no recurrence   dx DCIS left breast s/p  total mastectomy w/ reconstruction,  NO chemo or radiation therpy  (ER and PR negative)  . History of Paget's disease of breast   . Insomnia   . VIN II (vulvar intraepithelial neoplasia II)     Past Surgical History:  Procedure Laterality Date  . CATARACT EXTRACTION W/ INTRAOCULAR LENS IMPLANT  1990 approx.   " left eye I think"  . PARTIAL MASTECTOMY INCORPATING NIPPLE AREOLAR COMPLEX Left 12-28-2002   dr young   [facility]   hx paget's disease left nipple  . RE-EXCISION  PORTION OF THE LUMPECTOMY SITE Left 01-11-2003    dr young  [facility]  . TOTAL MASTECTOMY Left 02-16-2003  dr [facility]   w/ AXILLARY LYMPH NODE DISSECTION AND IMMEDIATE BREAST RECONSTRUCTION WITH SALINE IMPLANT  . VAGINAL HYSTERECTOMY  1983 approx.    Family History  Problem Relation Age of Onset  . Heart disease Mother        CHF  . Heart disease Father        MI  . Drug abuse Brother        In [place]; (drugs)  . Heart disease Brother        CAD (valve surg)  . Cancer Sister        lung cancer  . Cancer Brother   . Heart disease Sister        heart failure  . Breast cancer Daughter   . Breast cancer Maternal Grandmother   . Colon cancer Neg Hx     Social History:  reports that she has been smoking cigarettes.  She has a 22.50 pack-year smoking history. She has never used smokeless tobacco. She reports that she drinks alcohol. She reports that she does not use drugs.  Allergies:   Allergies  Allergen Reactions  . Chantix [Varenicline] Other (See Comments)    Intolerant- abnormal dreams.    . Wellbutrin [Bupropion] Other (See Comments)    Intolerant.      No medications prior to admission.    Review of Systems  Respiratory: Negative.   Cardiovascular: Negative.     Height 5\' 8"  (1.727 m), weight 74.8 kg (165 lb). Physical Exam  Constitutional: She appears well-developed and well-nourished.  Cardiovascular: Normal rate, regular rhythm and normal heart sounds.  No murmur heard. Respiratory: Effort normal and breath sounds normal. No respiratory distress. She has no wheezes.  GI: Soft. She exhibits no distension and no mass. There is no tenderness.  Genitourinary: Vagina normal and uterus normal.  Genitourinary Comments: 2 cm round skin lesion at 11:00 on vulva    No results found for this or any previous visit (from the past 24 hour(s)).  No results found.  Assessment/Plan: VIN 2 admitted for resection of the lesion.  Surgical procedure, risks, chances of removing the entire lesion have been discussed.  Blane Ohara Adalynne Steffensmeier 02/10/2018, 11:58 PM

## 2018-02-11 ENCOUNTER — Ambulatory Visit (HOSPITAL_BASED_OUTPATIENT_CLINIC_OR_DEPARTMENT_OTHER)
Admission: RE | Admit: 2018-02-11 | Discharge: 2018-02-11 | Disposition: A | Discharge status: Home / Self Care | Payer: 59 | Source: Ambulatory Visit | Attending: Obstetrics and Gynecology | Admitting: Obstetrics and Gynecology

## 2018-02-11 ENCOUNTER — Ambulatory Visit (HOSPITAL_BASED_OUTPATIENT_CLINIC_OR_DEPARTMENT_OTHER): Payer: 59 | Admitting: Certified Registered Nurse Anesthetist

## 2018-02-11 ENCOUNTER — Encounter (HOSPITAL_BASED_OUTPATIENT_CLINIC_OR_DEPARTMENT_OTHER)
Admission: RE | Disposition: A | Discharge status: Home / Self Care | Payer: Self-pay | Source: Ambulatory Visit | Attending: Obstetrics and Gynecology

## 2018-02-11 ENCOUNTER — Encounter (HOSPITAL_BASED_OUTPATIENT_CLINIC_OR_DEPARTMENT_OTHER): Payer: Self-pay

## 2018-02-11 ENCOUNTER — Other Ambulatory Visit: Payer: Self-pay

## 2018-02-11 DIAGNOSIS — Z9012 Acquired absence of left breast and nipple: Secondary | ICD-10-CM | POA: Insufficient documentation

## 2018-02-11 DIAGNOSIS — Z8249 Family history of ischemic heart disease and other diseases of the circulatory system: Secondary | ICD-10-CM | POA: Insufficient documentation

## 2018-02-11 DIAGNOSIS — G4700 Insomnia, unspecified: Secondary | ICD-10-CM | POA: Insufficient documentation

## 2018-02-11 DIAGNOSIS — N901 Moderate vulvar dysplasia: Secondary | ICD-10-CM | POA: Insufficient documentation

## 2018-02-11 DIAGNOSIS — H409 Unspecified glaucoma: Secondary | ICD-10-CM | POA: Diagnosis not present

## 2018-02-11 DIAGNOSIS — Z853 Personal history of malignant neoplasm of breast: Secondary | ICD-10-CM | POA: Diagnosis not present

## 2018-02-11 DIAGNOSIS — Z803 Family history of malignant neoplasm of breast: Secondary | ICD-10-CM | POA: Diagnosis not present

## 2018-02-11 DIAGNOSIS — F17200 Nicotine dependence, unspecified, uncomplicated: Secondary | ICD-10-CM | POA: Diagnosis not present

## 2018-02-11 DIAGNOSIS — Z801 Family history of malignant neoplasm of trachea, bronchus and lung: Secondary | ICD-10-CM | POA: Insufficient documentation

## 2018-02-11 DIAGNOSIS — K5900 Constipation, unspecified: Secondary | ICD-10-CM | POA: Diagnosis not present

## 2018-02-11 DIAGNOSIS — Z79899 Other long term (current) drug therapy: Secondary | ICD-10-CM | POA: Diagnosis not present

## 2018-02-11 DIAGNOSIS — Z888 Allergy status to other drugs, medicaments and biological substances status: Secondary | ICD-10-CM | POA: Diagnosis not present

## 2018-02-11 HISTORY — DX: Nausea with vomiting, unspecified: R11.2

## 2018-02-11 HISTORY — DX: Unspecified glaucoma: H40.9

## 2018-02-11 HISTORY — DX: Moderate vulvar dysplasia: N90.1

## 2018-02-11 HISTORY — PX: VULVECTOMY: SHX1086

## 2018-02-11 HISTORY — DX: Personal history of malignant neoplasm of breast: Z85.3

## 2018-02-11 HISTORY — DX: Other specified postprocedural states: Z98.890

## 2018-02-11 SURGERY — WIDE EXCISION VULVECTOMY
Anesthesia: Monitor Anesthesia Care | Site: Perineum

## 2018-02-11 MED ORDER — ONDANSETRON HCL 4 MG/2ML IJ SOLN
INTRAMUSCULAR | Status: AC
  Filled 2018-02-11: qty 2

## 2018-02-11 MED ORDER — DEXAMETHASONE SODIUM PHOSPHATE 10 MG/ML IJ SOLN
INTRAMUSCULAR | Status: AC
  Filled 2018-02-11: qty 1

## 2018-02-11 MED ORDER — PROPOFOL 500 MG/50ML IV EMUL
INTRAVENOUS | Status: DC | PRN
  Administered 2018-02-11: 100 ug/kg/min via INTRAVENOUS

## 2018-02-11 MED ORDER — MEPERIDINE HCL 25 MG/ML IJ SOLN
6.2500 mg | INTRAMUSCULAR | Status: DC | PRN
  Filled 2018-02-11: qty 1

## 2018-02-11 MED ORDER — BUPIVACAINE-EPINEPHRINE 0.5% -1:200000 IJ SOLN
INTRAMUSCULAR | Status: DC | PRN
  Administered 2018-02-11: 13 mL

## 2018-02-11 MED ORDER — HYDROCODONE-ACETAMINOPHEN 7.5-325 MG PO TABS
1.0000 | ORAL_TABLET | Freq: Once | ORAL | Status: DC | PRN
  Filled 2018-02-11: qty 1

## 2018-02-11 MED ORDER — LIDOCAINE HCL (CARDIAC) 20 MG/ML IV SOLN
INTRAVENOUS | Status: DC | PRN
  Administered 2018-02-11: 60 mg via INTRAVENOUS

## 2018-02-11 MED ORDER — LACTATED RINGERS IV SOLN
INTRAVENOUS | Status: DC
  Filled 2018-02-11: qty 1000

## 2018-02-11 MED ORDER — METOCLOPRAMIDE HCL 5 MG/ML IJ SOLN
10.0000 mg | Freq: Once | INTRAMUSCULAR | Status: DC | PRN
  Filled 2018-02-11: qty 2

## 2018-02-11 MED ORDER — PROPOFOL 10 MG/ML IV BOLUS
INTRAVENOUS | Status: AC
  Filled 2018-02-11: qty 20

## 2018-02-11 MED ORDER — LIDOCAINE 2% (20 MG/ML) 5 ML SYRINGE
INTRAMUSCULAR | Status: AC
  Filled 2018-02-11: qty 5

## 2018-02-11 MED ORDER — FENTANYL CITRATE (PF) 100 MCG/2ML IJ SOLN
INTRAMUSCULAR | Status: AC
  Filled 2018-02-11: qty 2

## 2018-02-11 MED ORDER — ONDANSETRON HCL 4 MG/2ML IJ SOLN
INTRAMUSCULAR | Status: DC | PRN
  Administered 2018-02-11: 4 mg via INTRAVENOUS

## 2018-02-11 MED ORDER — HYDROCODONE-ACETAMINOPHEN 5-325 MG PO TABS: 1.0000 | ORAL_TABLET | Freq: Four times a day (QID) | ORAL | 0 refills | Status: AC | PRN

## 2018-02-11 MED ORDER — MIDAZOLAM HCL 2 MG/2ML IJ SOLN
INTRAMUSCULAR | Status: AC
Start: 2018-02-11 — End: ?
  Filled 2018-02-11: qty 2

## 2018-02-11 MED ORDER — SCOPOLAMINE 1 MG/3DAYS TD PT72
1.0000 | MEDICATED_PATCH | TRANSDERMAL | Status: DC
  Filled 2018-02-11: qty 1

## 2018-02-11 MED ORDER — FENTANYL CITRATE (PF) 100 MCG/2ML IJ SOLN
INTRAMUSCULAR | Status: DC | PRN
  Administered 2018-02-11: 50 ug via INTRAVENOUS

## 2018-02-11 MED ORDER — FENTANYL CITRATE (PF) 100 MCG/2ML IJ SOLN
25.0000 ug | INTRAMUSCULAR | Status: DC | PRN
  Filled 2018-02-11: qty 1

## 2018-02-11 MED ORDER — SCOPOLAMINE 1 MG/3DAYS TD PT72
MEDICATED_PATCH | TRANSDERMAL | Status: AC
  Filled 2018-02-11: qty 1

## 2018-02-11 MED ORDER — PROPOFOL 500 MG/50ML IV EMUL
INTRAVENOUS | Status: AC
  Filled 2018-02-11: qty 50

## 2018-02-11 MED ORDER — ACETIC ACID 5 % SOLN
Status: DC | PRN
  Administered 2018-02-11: 1 via TOPICAL

## 2018-02-11 MED ORDER — LACTATED RINGERS IV SOLN
INTRAVENOUS | Status: DC
  Administered 2018-02-11: 08:00:00 via INTRAVENOUS
  Filled 2018-02-11: qty 1000

## 2018-02-11 MED ORDER — MIDAZOLAM HCL 5 MG/5ML IJ SOLN
INTRAMUSCULAR | Status: DC | PRN
  Administered 2018-02-11: 2 mg via INTRAVENOUS

## 2018-02-11 SURGICAL SUPPLY — 20 items
BLADE SURG 15 STRL LF DISP TIS (BLADE) ×1 IMPLANT
BLADE SURG 15 STRL SS (BLADE) ×2
CATH ROBINSON RED A/P 16FR (CATHETERS) IMPLANT
DRSG TELFA 3X8 NADH (GAUZE/BANDAGES/DRESSINGS) IMPLANT
GAUZE SPONGE 4X4 12PLY STRL (GAUZE/BANDAGES/DRESSINGS) ×2 IMPLANT
GLOVE BIO SURGEON STRL SZ8 (GLOVE) ×2 IMPLANT
GLOVE ORTHO TXT STRL SZ7.5 (GLOVE) ×2 IMPLANT
GOWN STRL REUS W/TWL LRG LVL3 (GOWN DISPOSABLE) ×2 IMPLANT
GOWN STRL REUS W/TWL XL LVL3 (GOWN DISPOSABLE) ×2 IMPLANT
NEEDLE HYPO 25X1 1.5 SAFETY (NEEDLE) IMPLANT
PACK PERINEAL COLD (PAD) ×2 IMPLANT
PACK VAGINAL WOMENS (CUSTOM PROCEDURE TRAY) ×2 IMPLANT
PAD ABD 8X10 STRL (GAUZE/BANDAGES/DRESSINGS) ×2 IMPLANT
PAD OB MATERNITY 4.3X12.25 (PERSONAL CARE ITEMS) IMPLANT
PENCIL BUTTON HOLSTER BLD 10FT (ELECTRODE) IMPLANT
SUT VIC AB 3-0 PS2 18 (SUTURE) ×2
SUT VIC AB 3-0 PS2 18XBRD (SUTURE) ×1 IMPLANT
SUT VIC AB 3-0 SH 18 (SUTURE) IMPLANT
TOWEL OR 17X24 6PK STRL BLUE (TOWEL DISPOSABLE) ×4 IMPLANT
WATER STERILE IRR 500ML POUR (IV SOLUTION) IMPLANT

## 2018-02-11 NOTE — Interval H&P Note (Signed)
History and Physical Interval Note:  02/11/2018 8:44 AM  Wendy Pruitt  has presented today for surgery, with the diagnosis of Vulval Intraepithelial Neoplasia Grade 2  The various methods of treatment have been discussed with the patient and family. After consideration of risks, benefits and other options for treatment, the patient has consented to  Procedure(s): WIDE EXCISION VULVECTOMY (N/A) as a surgical intervention .  The patient's history has been reviewed, patient examined, no change in status, stable for surgery.  I have reviewed the patient's chart and labs.  Questions were answered to the patient's satisfaction.     Blane Ohara Wendy Pruitt

## 2018-02-11 NOTE — Anesthesia Preprocedure Evaluation (Addendum)
Anesthesia Evaluation  Patient identified by MRN, date of birth, ID band Patient awake    Reviewed: Allergy & Precautions, NPO status , Patient's Chart, lab work & pertinent test results  History of Anesthesia Complications (+) PONV and history of anesthetic complications  Airway Mallampati: I  TM Distance: >3 FB Neck ROM: Full    Dental no notable dental hx. (+) Caps Upper and lower permanent bridges with extensive caps and crowns:   Pulmonary Current Smoker,    Pulmonary exam normal breath sounds clear to auscultation       Cardiovascular negative cardio ROS Normal cardiovascular exam Rhythm:Regular Rate:Normal     Neuro/Psych Glaucoma negative psych ROS   GI/Hepatic negative GI ROS, Neg liver ROS,   Endo/Other  Hx/o Breast Ca Hx/o Paget's disease of breast  Renal/GU negative Renal ROS  negative genitourinary   Musculoskeletal negative musculoskeletal ROS (+)   Abdominal   Peds  Hematology negative hematology ROS (+)   Anesthesia Other Findings   Reproductive/Obstetrics VIN 2                             Anesthesia Physical Anesthesia Plan  ASA: II  Anesthesia Plan: MAC   Post-op Pain Management:    Induction: Intravenous  PONV Risk Score and Plan: 4 or greater and Scopolamine patch - Pre-op, Midazolam, Dexamethasone, Ondansetron and Treatment may vary due to age or medical condition  Airway Management Planned: Natural Airway, Nasal Cannula and Simple Face Mask  Additional Equipment:   Intra-op Plan:   Post-operative Plan:   Informed Consent: I have reviewed the patients History and Physical, chart, labs and discussed the procedure including the risks, benefits and alternatives for the proposed anesthesia with the patient or authorized representative who has indicated his/her understanding and acceptance.   Dental advisory given  Plan Discussed with: CRNA,  Anesthesiologist and Surgeon  Anesthesia Plan Comments:        Anesthesia Quick Evaluation

## 2018-02-11 NOTE — Anesthesia Postprocedure Evaluation (Signed)
Anesthesia Post Note  Patient: Wendy Pruitt  Procedure(s) Performed: WIDE EXCISION VULVECTOMY (N/A Perineum)     Patient location during evaluation: PACU Anesthesia Type: MAC Level of consciousness: awake and alert and oriented Pain management: pain level controlled Vital Signs Assessment: post-procedure vital signs reviewed and stable Respiratory status: spontaneous breathing, nonlabored ventilation and respiratory function stable Cardiovascular status: stable and blood pressure returned to baseline Postop Assessment: no apparent nausea or vomiting Anesthetic complications: no    Last Vitals:  Vitals:   02/11/18 0945 02/11/18 1000  BP: 100/80   Pulse:  61  Resp: 16 12  Temp:    SpO2: 99% 98%    Last Pain:  Vitals:   02/11/18 1000  TempSrc:   PainSc: 0-No pain                 Dyana Magner A.

## 2018-02-11 NOTE — Transfer of Care (Signed)
Immediate Anesthesia Transfer of Care Note  Patient: Wendy Pruitt  Procedure(s) Performed: WIDE EXCISION VULVECTOMY (N/A Perineum)  Patient Location: PACU  Anesthesia Type:MAC  Level of Consciousness: awake, alert , oriented and patient cooperative  Airway & Oxygen Therapy: Patient Spontanous Breathing and Patient connected to face mask oxygen  Post-op Assessment: Report given to RN and Post -op Vital signs reviewed and stable  Post vital signs: Reviewed and stable  Last Vitals:  Vitals Value Taken Time  BP    Temp    Pulse    Resp    SpO2      Last Pain:  Vitals:   02/11/18 0732  TempSrc:   PainSc: 0-No pain         Complications: No apparent anesthesia complications

## 2018-02-11 NOTE — Discharge Instructions (Signed)
Call for severe pain, bleeding or if wound separates    Post Anesthesia Home Care Instructions  Activity: Get plenty of rest for the remainder of the day. A responsible individual must stay with you for 24 hours following the procedure.  For the next 24 hours, DO NOT: -Drive a car -Paediatric nurse -Drink alcoholic beverages -Take any medication unless instructed by your physician -Make any legal decisions or sign important papers.  Meals: Start with liquid foods such as gelatin or soup. Progress to regular foods as tolerated. Avoid greasy, spicy, heavy foods. If nausea and/or vomiting occur, drink only clear liquids until the nausea and/or vomiting subsides. Call your physician if vomiting continues.  Special Instructions/Symptoms: Your throat may feel dry or sore from the anesthesia or the breathing tube placed in your throat during surgery. If this causes discomfort, gargle with warm salt water. The discomfort should disappear within 24 hours.  If you had a scopolamine patch placed behind your ear for the management of post- operative nausea and/or vomiting:  1. The medication in the patch is effective for 72 hours, after which it should be removed.  Wrap patch in a tissue and discard in the trash. Wash hands thoroughly with soap and water. 2. You may remove the patch earlier than 72 hours if you experience unpleasant side effects which may include dry mouth, dizziness or visual disturbances. 3. Avoid touching the patch. Wash your hands with soap and water after contact with the patch.

## 2018-02-11 NOTE — Op Note (Signed)
Preoperative diagnosis: VIN 2 Postoperative diagnosis: VIN 2 Procedure: Wide local excision of vulvar lesion Surgeon: Cheri Fowler MD Anesthesia: General with an LMA and local Estimated blood loss: 20 cc Findings: The vulva at 11:00 was stained with acetic acid and a 2 cm lesion was identified. Specimens: Vulvar lesion sent for routine pathology  Procedure:  The patient was taken to the operating room and placed in the dorsal supine position.  MAC anesthesia was induced and she was placed in Mobile stirrups.  Perineum and vagina were then prepped and draped in the usual sterile fashion and bladder drained of a minimal amount of urine.  Acetic acid was then applied to the lesion at 11:00 to help delineate margins.  The lesion was then infiltrated with half percent Marcaine with epi for local anesthetic.  The edges of the margins were easily identified.  A scalpel was used to incise around the lesion leaving several millimeters between the incision and the identified abnormal area.  Once I came around the entire area with a scalpel, scissors were used to remove a shallow lesion removing the entire skin in one specimen.  This appeared to achieve complete removal.  Some bleeding was controlled with Bovie.  The incision was then closed in a vertical fashion with running 3-0 Vicryl with adequate closure and adequate hemostasis.  Extra half percent Marcaine was infiltrated for anesthetic.  The lesion was hemostatic. Neosporin was applied. The patient was awakened in the operating room and taken to the recovery room in stable condition.  Counts were correct and she had PAS hose on throughout the procedure.

## 2018-02-11 NOTE — Anesthesia Procedure Notes (Signed)
Procedure Name: MAC Date/Time: 02/11/2018 9:11 AM Performed by: Signe Colt, CRNA Pre-anesthesia Checklist: Patient identified, Emergency Drugs available, Suction available, Patient being monitored and Timeout performed Patient Re-evaluated:Patient Re-evaluated prior to induction Oxygen Delivery Method: Simple face mask

## 2018-02-12 ENCOUNTER — Encounter (HOSPITAL_BASED_OUTPATIENT_CLINIC_OR_DEPARTMENT_OTHER): Payer: Self-pay | Admitting: Obstetrics and Gynecology

## 2018-02-26 DIAGNOSIS — H401113 Primary open-angle glaucoma, right eye, severe stage: Secondary | ICD-10-CM | POA: Diagnosis not present

## 2018-02-26 DIAGNOSIS — H2512 Age-related nuclear cataract, left eye: Secondary | ICD-10-CM | POA: Diagnosis not present

## 2018-02-26 DIAGNOSIS — H401121 Primary open-angle glaucoma, left eye, mild stage: Secondary | ICD-10-CM | POA: Diagnosis not present

## 2018-03-17 DIAGNOSIS — H01133 Eczematous dermatitis of right eye, unspecified eyelid: Secondary | ICD-10-CM | POA: Diagnosis not present

## 2018-03-17 DIAGNOSIS — R21 Rash and other nonspecific skin eruption: Secondary | ICD-10-CM | POA: Diagnosis not present

## 2018-03-17 DIAGNOSIS — L280 Lichen simplex chronicus: Secondary | ICD-10-CM | POA: Diagnosis not present

## 2018-03-19 DIAGNOSIS — H401113 Primary open-angle glaucoma, right eye, severe stage: Secondary | ICD-10-CM | POA: Diagnosis not present

## 2018-03-19 DIAGNOSIS — H401121 Primary open-angle glaucoma, left eye, mild stage: Secondary | ICD-10-CM | POA: Diagnosis not present

## 2018-04-03 ENCOUNTER — Other Ambulatory Visit: Payer: Self-pay | Admitting: Family Medicine

## 2018-04-03 NOTE — Telephone Encounter (Signed)
Last office visit 12/02/2017.  Last refilled 10/18/2017 for #30 with 5 refills.  Ok to refill?

## 2018-04-04 NOTE — Telephone Encounter (Signed)
Sent. Thanks.   

## 2018-04-23 ENCOUNTER — Other Ambulatory Visit: Payer: Self-pay | Admitting: Obstetrics and Gynecology

## 2018-04-23 ENCOUNTER — Other Ambulatory Visit: Payer: Self-pay | Admitting: Family Medicine

## 2018-04-23 DIAGNOSIS — Z1231 Encounter for screening mammogram for malignant neoplasm of breast: Secondary | ICD-10-CM

## 2018-06-02 ENCOUNTER — Ambulatory Visit
Admission: RE | Admit: 2018-06-02 | Discharge: 2018-06-02 | Disposition: A | Discharge status: Home / Self Care | Payer: 59 | Source: Ambulatory Visit | Attending: Obstetrics and Gynecology | Admitting: Obstetrics and Gynecology

## 2018-06-02 DIAGNOSIS — Z1231 Encounter for screening mammogram for malignant neoplasm of breast: Secondary | ICD-10-CM | POA: Diagnosis not present

## 2018-07-11 ENCOUNTER — Ambulatory Visit: Payer: 59 | Admitting: Family Medicine

## 2018-07-11 ENCOUNTER — Encounter: Payer: Self-pay | Admitting: Family Medicine

## 2018-07-11 VITALS — BP 124/70 | HR 76 | Temp 98.3°F | Ht 68.5 in | Wt 173.2 lb

## 2018-07-11 DIAGNOSIS — F17200 Nicotine dependence, unspecified, uncomplicated: Secondary | ICD-10-CM

## 2018-07-11 DIAGNOSIS — Z0130 Encounter for examination of blood pressure without abnormal findings: Secondary | ICD-10-CM | POA: Insufficient documentation

## 2018-07-11 DIAGNOSIS — I959 Hypotension, unspecified: Secondary | ICD-10-CM | POA: Diagnosis not present

## 2018-07-11 NOTE — Patient Instructions (Addendum)
Your bp is fine today  Do liberalize sodium - and drink lots of fluids If you become dizzy (especially with changes in position or exercise) please let us know   Please think about quitting smoking

## 2018-07-11 NOTE — Progress Notes (Signed)
Subjective:    Patient ID: Wendy Pruitt, female    DOB: 12/07/1955, 62 y.o.   MRN: [id number]  HPI Here for bp concerns   Wt Readings from Last 3 Encounters:  07/11/18 173 lb 4 oz (78.6 kg)  02/11/18 166 lb 12.8 oz (75.7 kg)  12/02/17 165 lb (74.8 kg)   25.96 kg/m   Running 90/50 at home- with neighbor's cuff    Here -not as low BP Readings from Last 3 Encounters:  07/11/18 124/70  02/11/18 (!) 122/55  12/02/17 102/70   Pulse Readings from Last 3 Encounters:  07/11/18 76  02/11/18 (!) 57  12/02/17 85    Current smoker -not ready to quit   Had a vulvar biopsy in April  bp was 70/50 when she went in  Did well with that   Then saw her eye surgeon   Today: 120/70 lying  118/70 sitting 118/70 standing  No change in pulse  Patient Active Problem List   Diagnosis Date Noted  . Low blood pressure 07/11/2018  . Glaucoma 10/20/2017  . Advance care planning 09/08/2014  . History of breast cancer in female 03/13/2012  . Routine general medical examination at a health care facility 02/29/2012  . Insomnia 02/29/2012  . Constipation 02/29/2012  . NEOP, MALIGNANT, FEMALE BREAST NOS 06/22/2008  . TOBACCO ABUSE 08/15/2007   Past Medical History:  Diagnosis Date  . Glaucoma, both eyes   . History of left breast cancer 2004--- per pt no recurrence   dx DCIS left breast s/p  total mastectomy w/ reconstruction,  NO chemo or radiation therpy  (ER and PR negative)  . History of Paget's disease of breast   . Insomnia   . PONV (postoperative nausea and vomiting)   . VIN II (vulvar intraepithelial neoplasia II)    Past Surgical History:  Procedure Laterality Date  . CATARACT EXTRACTION W/ INTRAOCULAR LENS IMPLANT  1990 approx.   " left eye I think"  . PARTIAL MASTECTOMY INCORPATING NIPPLE AREOLAR COMPLEX Left 12-28-2002   dr young   [facility]   hx paget's disease left nipple  . RE-EXCISION  PORTION OF THE LUMPECTOMY SITE Left 01-11-2003    dr young  [facility]  . TOTAL  MASTECTOMY Left 02-16-2003  dr young [facility]   w/ AXILLARY LYMPH NODE DISSECTION AND IMMEDIATE BREAST RECONSTRUCTION WITH SALINE IMPLANT  . VAGINAL HYSTERECTOMY  1983 approx.  Eugenie Norrie N/A 02/11/2018   Procedure: WIDE EXCISION VULVECTOMY;  Surgeon: Cheri Fowler, MD;  Location: [facility];  Service: Gynecology;  Laterality: N/A;   Social History   Tobacco Use  . Smoking status: Current Every Day Smoker    Packs/day: 0.75    Years: 30.00    Pack years: 22.50    Types: Cigarettes  . Smokeless tobacco: Never Used  Substance Use Topics  . Alcohol use: Not Currently    Alcohol/week: 0.0 standard drinks    Comment: occassionally  . Drug use: No   Family History  Problem Relation Age of Onset  . Heart disease Mother        CHF  . Heart disease Father        MI  . Drug abuse Brother        In [place]; (drugs)  . Heart disease Brother        CAD (valve surg)  . Cancer Sister        lung cancer  . Cancer Brother   . Heart disease Sister  heart failure  . Breast cancer Daughter   . Breast cancer Maternal Grandmother   . Colon cancer Neg Hx    Allergies  Allergen Reactions  . Chantix [Varenicline] Other (See Comments)    Intolerant- abnormal dreams.    . Wellbutrin [Bupropion] Other (See Comments)    Intolerant.     Current Outpatient Medications on File Prior to Visit  Medication Sig Dispense Refill  . cromolyn (OPTICROM) 4 % ophthalmic solution Place 1 drop into both eyes daily as needed.     . dorzolamide (TRUSOPT) 2 % ophthalmic solution Place 1 drop into the right eye 2 (two) times daily.  99  . eszopiclone (LUNESTA) 2 MG TABS tablet TAKE 1/2 TO 1 TABLET BY MOUTH AT BEDTIME AS NEEDED. USE SPARINGLY. 30 tablet 5  . latanoprost (XALATAN) 0.005 % ophthalmic solution Place 1 drop into both eyes at bedtime.     . pimecrolimus (ELIDEL) 1 % cream Apply 1 application topically 2 (two) times daily.    . timolol (BETIMOL) 0.25 % ophthalmic solution Place 1  drop into the right eye every morning.      No current facility-administered medications on file prior to visit.     Review of Systems  Constitutional: Negative for activity change, appetite change, fatigue, fever and unexpected weight change.  HENT: Negative for congestion, ear pain, rhinorrhea, sinus pressure and sore throat.   Eyes: Negative for pain, redness and visual disturbance.  Respiratory: Negative for cough, shortness of breath and wheezing.   Cardiovascular: Negative for chest pain and palpitations.  Gastrointestinal: Negative for abdominal pain, blood in stool, constipation and diarrhea.  Endocrine: Negative for polydipsia and polyuria.  Genitourinary: Negative for dysuria, frequency and urgency.  Musculoskeletal: Negative for arthralgias, back pain and myalgias.  Skin: Negative for pallor and rash.  Allergic/Immunologic: Negative for environmental allergies.  Neurological: Positive for light-headedness. Negative for dizziness, tremors, syncope, speech difficulty, weakness, numbness and headaches.  Hematological: Negative for adenopathy. Does not bruise/bleed easily.  Psychiatric/Behavioral: Negative for decreased concentration and dysphoric mood. The patient is not nervous/anxious.        Objective:   Physical Exam  Constitutional: She appears well-developed and well-nourished. No distress.  Well appearing   HENT:  Head: Normocephalic and atraumatic.  Mouth/Throat: Oropharynx is clear and moist.  Eyes: Pupils are equal, round, and reactive to light. Conjunctivae and EOM are normal.  Neck: Normal range of motion. Neck supple. No JVD present. Carotid bruit is not present. No thyromegaly present.  Cardiovascular: Normal rate, regular rhythm, normal heart sounds and intact distal pulses. Exam reveals no gallop.  Pulmonary/Chest: Effort normal and breath sounds normal. No respiratory distress. She has no wheezes. She has no rales.  No crackles  bs are slt distant No  wheeze  Abdominal: Soft. Bowel sounds are normal. She exhibits no distension, no abdominal bruit and no mass. There is no tenderness.  Musculoskeletal: She exhibits no edema.  Lymphadenopathy:    She has no cervical adenopathy.  Neurological: She is alert. She has normal reflexes.  Skin: Skin is warm and dry. No rash noted.  Psychiatric: She has a normal mood and affect.          Assessment & Plan:   Problem List Items Addressed This Visit      Cardiovascular and Mediastinum   Low blood pressure - Primary    bp on automatic cuffs were below 90 systolic and 65 diastolic on several occasions- with no hx of HTN or medication  No symptoms- not dizzy or orthostatic  Here bp is low normal  BP Readings from Last 3 Encounters:  07/11/18 124/70  02/11/18 (!) 122/55  12/02/17 102/70   In light of no symptoms- I think this can be watched inst pt to alert [place] if she develops dizziness /syncope or orthostasis Disc imp of water intake  Also can liberalize sodium Recommend smoking cessation        Other   TOBACCO ABUSE    Disc in detail risks of smoking and possible outcomes including copd, vascular/ heart disease, cancer , respiratory and sinus infections  Pt voices understanding

## 2018-07-13 ENCOUNTER — Encounter: Payer: Self-pay | Admitting: Family Medicine

## 2018-07-13 NOTE — Assessment & Plan Note (Signed)
Disc in detail risks of smoking and possible outcomes including copd, vascular/ heart disease, cancer , respiratory and sinus infections  Pt voices understanding  

## 2018-07-13 NOTE — Assessment & Plan Note (Signed)
bp on automatic cuffs were below 90 systolic and 65 diastolic on several occasions- with no hx of HTN or medication  No symptoms- not dizzy or orthostatic  Here bp is low normal  BP Readings from Last 3 Encounters:  07/11/18 124/70  02/11/18 (!) 122/55  12/02/17 102/70   In light of no symptoms- I think this can be watched inst pt to alert [place] if she develops dizziness /syncope or orthostasis Disc imp of water intake  Also can liberalize sodium Recommend smoking cessation

## 2018-09-17 DIAGNOSIS — Z961 Presence of intraocular lens: Secondary | ICD-10-CM | POA: Diagnosis not present

## 2018-09-17 DIAGNOSIS — H401112 Primary open-angle glaucoma, right eye, moderate stage: Secondary | ICD-10-CM | POA: Diagnosis not present

## 2018-09-17 DIAGNOSIS — H2512 Age-related nuclear cataract, left eye: Secondary | ICD-10-CM | POA: Diagnosis not present

## 2018-09-22 ENCOUNTER — Ambulatory Visit: Payer: 59 | Admitting: Family Medicine

## 2018-09-22 ENCOUNTER — Encounter: Payer: Self-pay | Admitting: Family Medicine

## 2018-09-22 DIAGNOSIS — G4700 Insomnia, unspecified: Secondary | ICD-10-CM | POA: Diagnosis not present

## 2018-09-22 DIAGNOSIS — Z0130 Encounter for examination of blood pressure without abnormal findings: Secondary | ICD-10-CM | POA: Diagnosis not present

## 2018-09-22 MED ORDER — ESZOPICLONE 2 MG PO TABS
ORAL_TABLET | ORAL | 5 refills | Status: DC
Start: 2018-09-22 — End: 2019-03-11

## 2018-09-22 NOTE — Progress Notes (Signed)
Not on any oral BP medications.  On timolol eyedrops.  She had been checking her pressure a few times on her neighbor's cuff.    She has a h/o controlled vs relatively low/normal BP readings.    She doesn't feel like she is going to pass out but she sometimes noted some changes prev when walking down stairs at work.  She is really careful with stairs after a fall a few years ago.  She called it "dizziness" but the room didn't spin.  She has intentionally lost weight with diet and exercise. She is walking 8-10 miles a week.    No CP.  Not SOB unless expected levels with exertion and walking a steep incline.  She isn't dizzy now.    D/w pt about drinking enough water.    Insomnia. Treated with lunesta, no ADE on med.  She sleeps better with med.  Needed refill.    Meds, vitals, and allergies reviewed.   ROS: Per HPI unless specifically indicated in ROS section   GEN: nad, alert and oriented HEENT: mucous membranes moist NECK: supple w/o LA CV: rrr PULM: ctab, no inc wob ABD: soft, +bs EXT: no edema SKIN: no acute rash  RRR w/o tachycardia with moving from sitting to standing w/o orthostatic sx.

## 2018-09-22 NOTE — Patient Instructions (Signed)
Goal to drink enough water to keep your urine clear or light colored.   Your BP was fine today.  If you have more troubles then let us know.  Update me as needed.  Take care.  Glad to see you.

## 2018-09-23 NOTE — Assessment & Plan Note (Signed)
Treated with lunesta, no ADE on med.  She sleeps better with med.  Needed refill.  Done at office visit.

## 2018-09-23 NOTE — Assessment & Plan Note (Signed)
Blood pressure normal on check here today.  Not orthostatic on standing.  Normal basic cardiovascular exam.  Discussed with patient about making sure she is drinking enough fluid.  Update me as needed.  She agrees.

## 2018-12-02 ENCOUNTER — Other Ambulatory Visit: Payer: Self-pay | Admitting: Family Medicine

## 2018-12-02 ENCOUNTER — Other Ambulatory Visit (INDEPENDENT_AMBULATORY_CARE_PROVIDER_SITE_OTHER): Payer: 59

## 2018-12-02 DIAGNOSIS — Z8249 Family history of ischemic heart disease and other diseases of the circulatory system: Secondary | ICD-10-CM | POA: Diagnosis not present

## 2018-12-02 LAB — LIPID PANEL
CHOLESTEROL: 170 mg/dL (ref 0–200)
HDL: 39.8 mg/dL (ref >39.00)
LDL Cholesterol: 114 mg/dL — ABNORMAL HIGH (ref 0–99)
NONHDL: 130.61
Total CHOL/HDL Ratio: 4
Triglycerides: 82 mg/dL (ref 0.0–149.0)
VLDL: 16.4 mg/dL (ref 0.0–40.0)

## 2018-12-02 LAB — COMPREHENSIVE METABOLIC PANEL
ALK PHOS: 88 U/L (ref 39–117)
ALT: 9 U/L (ref 0–35)
AST: 16 U/L (ref 0–37)
Albumin: 3.8 g/dL (ref 3.5–5.2)
BILIRUBIN TOTAL: 0.5 mg/dL (ref 0.2–1.2)
BUN: 21 mg/dL (ref 6–23)
CALCIUM: 9.3 mg/dL (ref 8.4–10.5)
CO2: 30 mEq/L (ref 19–32)
Chloride: 106 mEq/L (ref 96–112)
Creatinine, Ser: 0.85 mg/dL (ref 0.40–1.20)
GFR: 67.22 mL/min (ref >60.00)
GLUCOSE: 104 mg/dL — AB (ref 70–99)
Potassium: 4.3 mEq/L (ref 3.5–5.1)
Sodium: 141 mEq/L (ref 135–145)
TOTAL PROTEIN: 6.4 g/dL (ref 6.0–8.3)

## 2018-12-08 ENCOUNTER — Encounter: Payer: Self-pay | Admitting: Family Medicine

## 2018-12-08 ENCOUNTER — Ambulatory Visit (INDEPENDENT_AMBULATORY_CARE_PROVIDER_SITE_OTHER): Payer: 59 | Admitting: Family Medicine

## 2018-12-08 VITALS — BP 104/64 | HR 80 | Temp 98.5°F | Ht 68.5 in

## 2018-12-08 DIAGNOSIS — G4700 Insomnia, unspecified: Secondary | ICD-10-CM

## 2018-12-08 DIAGNOSIS — Z0000 Encounter for general adult medical examination without abnormal findings: Secondary | ICD-10-CM | POA: Diagnosis not present

## 2018-12-08 DIAGNOSIS — Z7189 Other specified counseling: Secondary | ICD-10-CM

## 2018-12-08 NOTE — Patient Instructions (Addendum)
Check to see if cologuard is covered.  Let me know if you want to get that done.   Thanks for getting a flu shot.  Update me as needed.  Take care.  Glad to see you.

## 2018-12-08 NOTE — Progress Notes (Signed)
CPE- See plan.  Routine anticipatory guidance given to patient.  See health maintenance.  The possibility exists that previously documented standard health maintenance information may have been brought forward from a previous encounter into this note.  If needed, that same information has been updated to reflect the current situation based on today's encounter.    Tetanus encouraged.   Flu prev done at work, 08/2018.   PNA not due Shingles d/w pt.  We don't have it in stock.  D/w pt.  Pap d/w pt- defer to gyn clinic.  she agrees.   Mammogram 2019.  Up to date.   DXA- defer to 65.   Living will d/w pt. Would have her daughter Joya Salm if patient were incapacitated.  Diet and exercise d/w pt. Encouraged both.  Walking on the track.  Still smoking, 1/2 PPD, d/w pt about options.  She is trying limit and wean down.   Prev labs d/w pt.   D/w patient [id number] for colon cancer screening, including IFOB vs. colonoscopy. Risks and benefits of both were discussed and patient voiced understanding. Pt elects for: cologuard.    Insomnia.  Sleeping okay with med. No ADE on med.  She tried to cut down on med but didn't do well with that since the pills were too hard to split.  She can sleep 7-8 hours with med.    She has had eye clinic f/u re: pressure, now controlled.    PMH and SH reviewed  Meds, vitals, and allergies reviewed.   ROS: Per HPI.  Unless specifically indicated otherwise in HPI, the patient denies:  General: fever. Eyes: acute vision changes ENT: sore throat Cardiovascular: chest pain Respiratory: SOB GI: vomiting GU: dysuria Musculoskeletal: acute back pain Derm: acute rash Neuro: acute motor dysfunction Psych: worsening mood Endocrine: polydipsia Heme: bleeding Allergy: hayfever  GEN: nad, alert and oriented HEENT: mucous membranes moist NECK: supple w/o LA CV: rrr. PULM: ctab, no inc wob ABD: soft, +bs EXT: no edema SKIN: no acute rash

## 2018-12-10 NOTE — Assessment & Plan Note (Signed)
Sleeping okay with med. No ADE on med.  She tried to cut down on med but didn't do well with that since the pills were too hard to split.  She can sleep 7-8 hours with med.  Would continue as is.  She agrees.

## 2018-12-10 NOTE — Assessment & Plan Note (Signed)
Living will d/w pt. Would have her daughter Joya Salm if patient were incapacitated.

## 2018-12-10 NOTE — Assessment & Plan Note (Signed)
Tetanus encouraged.   Flu prev done at work, 08/2018.   PNA not due Shingles d/w pt.  We don't have it in stock.  D/w pt.  Pap d/w pt- defer to gyn clinic.  she agrees.   Mammogram 2019.  Up to date.   DXA- defer to 39.   Living will d/w pt. Would have her daughter Joya Salm if patient were incapacitated.  Diet and exercise d/w pt. Encouraged both.  Walking on the track.  Still smoking, 1/2 PPD, d/w pt about options.  She is trying limit and wean down.   Prev labs d/w pt.   D/w patient IT:GPQDIYM for colon cancer screening, including IFOB vs. colonoscopy. Risks and benefits of both were discussed and patient voiced understanding. Pt elects for: cologuard.

## 2018-12-23 DIAGNOSIS — L720 Epidermal cyst: Secondary | ICD-10-CM | POA: Diagnosis not present

## 2018-12-23 DIAGNOSIS — D229 Melanocytic nevi, unspecified: Secondary | ICD-10-CM | POA: Diagnosis not present

## 2018-12-23 DIAGNOSIS — L2489 Irritant contact dermatitis due to other agents: Secondary | ICD-10-CM | POA: Diagnosis not present

## 2018-12-23 DIAGNOSIS — L820 Inflamed seborrheic keratosis: Secondary | ICD-10-CM | POA: Diagnosis not present

## 2019-01-13 ENCOUNTER — Telehealth: Payer: Self-pay | Admitting: Family Medicine

## 2019-01-13 DIAGNOSIS — Z1211 Encounter for screening for malignant neoplasm of colon: Secondary | ICD-10-CM

## 2019-01-13 NOTE — Telephone Encounter (Signed)
Pt called [employer] and was told ins will cover Cologuard. She would like to have it ordered and shipped to her house.

## 2019-01-14 DIAGNOSIS — Z01419 Encounter for gynecological examination (general) (routine) without abnormal findings: Secondary | ICD-10-CM | POA: Diagnosis not present

## 2019-01-14 DIAGNOSIS — Z1389 Encounter for screening for other disorder: Secondary | ICD-10-CM | POA: Diagnosis not present

## 2019-01-14 DIAGNOSIS — Z130 Encounter for screening for diseases of the blood and blood-forming organs and certain disorders involving the immune mechanism: Secondary | ICD-10-CM | POA: Diagnosis not present

## 2019-01-14 NOTE — Telephone Encounter (Signed)
Please set this up.  Thanks.

## 2019-01-19 NOTE — Addendum Note (Signed)
Addended by: Josetta Huddle on: 01/19/2019 09:22 AM   Modules accepted: Orders

## 2019-01-19 NOTE — Telephone Encounter (Signed)
Order faxed.

## 2019-01-27 DIAGNOSIS — Z1211 Encounter for screening for malignant neoplasm of colon: Secondary | ICD-10-CM | POA: Diagnosis not present

## 2019-02-02 LAB — COLOGUARD: Cologuard: NEGATIVE

## 2019-02-03 DIAGNOSIS — D485 Neoplasm of uncertain behavior of skin: Secondary | ICD-10-CM | POA: Diagnosis not present

## 2019-02-03 DIAGNOSIS — B078 Other viral warts: Secondary | ICD-10-CM | POA: Diagnosis not present

## 2019-02-03 DIAGNOSIS — R208 Other disturbances of skin sensation: Secondary | ICD-10-CM | POA: Diagnosis not present

## 2019-02-03 DIAGNOSIS — R234 Changes in skin texture: Secondary | ICD-10-CM | POA: Diagnosis not present

## 2019-02-06 ENCOUNTER — Encounter: Payer: Self-pay | Admitting: *Deleted

## 2019-03-11 ENCOUNTER — Other Ambulatory Visit: Payer: Self-pay | Admitting: Family Medicine

## 2019-03-11 NOTE — Telephone Encounter (Signed)
Electronic refill request Wendy Pruitt Last refill 09/22/18 #30/5 Last office visit 12/08/18

## 2019-03-11 NOTE — Telephone Encounter (Signed)
Sent. Thanks.   

## 2019-04-28 ENCOUNTER — Telehealth: Payer: Self-pay | Admitting: Family Medicine

## 2019-04-28 NOTE — Telephone Encounter (Signed)
°  Patient would like a call back to discuss medications.  Eszopiclone.   Patient stated she spoke with some one at [employer] and they stated that Dr Damita Dunnings could file a PA for this medication.  Phone- [phone]

## 2019-04-28 NOTE — Telephone Encounter (Signed)
PA submitted thru [facility], awaiting response.

## 2019-04-30 NOTE — Telephone Encounter (Signed)
Fax received saying PA is not required.  Medication is on the list of covered drugs.

## 2019-05-01 NOTE — Telephone Encounter (Signed)
Patient called back and stated that her insurance has changed started on July 1st, it will still be [facility] but a new plan   She stated that [facility] advised her to contact us for medication and that starting July 1st she would have to have the PA submitted for the Eszopiclone   Patient stated that she is not out of medication and has enough to last until the 21st but wanted to get a jump start on having the PA submitted with the new insurance goes into effect.    Patient C/B # [phone]

## 2019-05-01 NOTE — Telephone Encounter (Signed)
Will need to get updated information and do PA once new insurance is in effect. Can not do that prior to it been active. Thanks. Will wait for July 1st to re address.

## 2019-05-07 NOTE — Telephone Encounter (Signed)
Spoke with patient today. She will bring her insurance card to the office and I will complete PA for Lunesta today.

## 2019-05-07 NOTE — Telephone Encounter (Signed)
PA submitted and awaiting response. There was a notification that preferred alternatives are Belsomra, Ramelteon, Rozerem and Trazodone. When I spoke with patient earlier she said no other prescripitons have been tried before. She has been on Lunesta since 2012 at least per epic. Patient did try OTC Tylenol PM type of medication in the past. Advised patient at that time that her insurance may ask her to try alternative medication. Awaiting reply

## 2019-05-09 NOTE — Telephone Encounter (Signed)
Please let me know what you hear when I get back to clinic.  We can see about trying another medication if she cannot get approval for lunesta.  Thanks.

## 2019-05-11 NOTE — Telephone Encounter (Signed)
PA denied because this medication is on her Drug list of covered medication without trying other medications listed below. I printed response from insurance and placed it on Dr. Josefine Class desk for review when he returns.

## 2019-05-17 NOTE — Telephone Encounter (Signed)
I will look at the form when I get back to clinic.  Please let me know if she wants to pay cash for this medication or if she is willing to try 1 of the alternatives.  Thanks.

## 2019-05-18 NOTE — Telephone Encounter (Signed)
Pt called checking on prior autho Please advise Pt will be out of med next week  Best number [phone]

## 2019-05-18 NOTE — Telephone Encounter (Addendum)
Patient says she will try one of the alternative medications.  Please send in the Rx to [facility], [address] [place].

## 2019-05-18 NOTE — Telephone Encounter (Signed)
Please let me know if she wants to pay cash for this medication or if she is willing to try 1 of the alternatives.  Thanks.

## 2019-05-19 ENCOUNTER — Other Ambulatory Visit: Payer: Self-pay | Admitting: Obstetrics and Gynecology

## 2019-05-19 DIAGNOSIS — Z1231 Encounter for screening mammogram for malignant neoplasm of breast: Secondary | ICD-10-CM

## 2019-05-19 MED ORDER — TRAZODONE HCL 50 MG PO TABS: 25.0000 mg | ORAL_TABLET | Freq: Every evening | ORAL | 3 refills | Status: DC | PRN

## 2019-05-19 NOTE — Addendum Note (Signed)
Addended by: Tonia Ghent on: 05/19/2019 06:23 AM   Modules accepted: Orders

## 2019-05-19 NOTE — Telephone Encounter (Signed)
Would try trazodone.  Prescription sent.  Thanks.  Update [place] as needed.

## 2019-05-20 ENCOUNTER — Telehealth: Payer: Self-pay | Admitting: Family Medicine

## 2019-05-20 NOTE — Telephone Encounter (Signed)
Best number [phone]  Pt called stating a rx for traxodone.  Pt state she cannot take this medication pt has glaucoma.   Pt was very upset that this was called in for her.  She stated this medication should never have been called in for her.  She stated it clearly stated do not take if you have glaucoma.  she has 90 pills and wants to know what she needs to do with them  She will not be taking them and wants this off her record.  She would like a call back today after 11 she has dentist appointment @9 :30

## 2019-05-20 NOTE — Telephone Encounter (Signed)
Please call the patient and get her scheduled for an office visit (in the office) when possible.  I talked to her on the phone today.  She is not having chest pain or shortness of breath.  She does not have any new focal neurologic issues but she has had some episodic changes in balance that she notices when walking downstairs or looking downward.  This is been going on for approximately 1 year per patient report.  She has not passed out.  We talked about maintaining adequate hydration and avoiding hypoglycemia in the meantime, in case that could potentially contribute to her symptoms.  ===================================  Also talked to her about her other concerns-I had previously sent prescription for trazodone to try for insomnia.  She was concerned about glaucoma risk.  This would apply to acute angle-closure glaucoma which is not her situation.  I also discussed with Dr. Katy Fitch, her ophthalmologist.  He graciously took the phone call.  He was familiar with her case and he completely agreed that trazodone would not be contraindicated in her situation and it should be fine to try and this was a good option for insomnia in her case.  We also talked about the other potential use of trazodone.  It is occasionally used for depression but is more commonly used at lower doses for insomnia.  We talked about dual use medications in general.  She is not depressed.  Discussed all of this with patient by phone.  She understood.  She completely felt better after the conversation.  She admitted that she got really worried when she saw the glaucoma warning.  She is going to try trazodone and then let me know how this goes.  Based on the available information/options right now I think this is a completely reasonable medication to try and she understood that.  Her ophthalmologist agrees.  She thanked me for the call.  She appreciates the help of all involved.  I greatly appreciate the help from all, including the  ophthalmology clinic.

## 2019-05-20 NOTE — Telephone Encounter (Signed)
I am very sorry patient is unhappy.   I will forward this to PCP Dr. Damita Dunnings, as I cannot speak for him regarding ordering of this prescription for Trazodone.    Appears he ordered this based on conversations on 04/28/19 and then order was sent in on 05/19/19.  Will ask Dr. Damita Dunnings for further information to share with patient and make him aware.    He is out of the office today but sometimes works from home.  I will send him a message and follow up with patient as she requested.   Thanks.

## 2019-05-20 NOTE — Telephone Encounter (Signed)
[place] Night - Client Nonclinical Telephone Record AccessNurse Client [facility] Night - Client Client Site [place] Physician Renford Dills - MD Contact Type Call Who Is Calling Patient / Member / Family / Caregiver Caller Name Charco Phone Number [phone] Patient Name Wendy Pruitt Patient DOB [birth year] Call Type Message Only Information Provided Reason for Call Request for General Office Information Initial Comment Caller states she wants to reach the office, she is coming by there, wants to know if she can stop in Additional Comment They prescribed medication she can't be taking, wants to know why it was prescribed, and how to dispose it. The insurance changed it, its for anxiety and depression but she's not depressed. Call Closed By: Mable Paris Transaction Date/Time: 05/20/2019 7:42:01 AM (ET)

## 2019-05-21 NOTE — Telephone Encounter (Signed)
7/21 Pt aware

## 2019-05-21 NOTE — Telephone Encounter (Signed)
LVM for pt to call us back and schedule In Office appt

## 2019-05-21 NOTE — Telephone Encounter (Signed)
Pt called back and scheduled appt for 05/26/19

## 2019-05-26 ENCOUNTER — Other Ambulatory Visit: Payer: Self-pay

## 2019-05-26 ENCOUNTER — Ambulatory Visit (INDEPENDENT_AMBULATORY_CARE_PROVIDER_SITE_OTHER): Payer: Medicare Other | Admitting: Family Medicine

## 2019-05-26 ENCOUNTER — Encounter: Payer: Self-pay | Admitting: Family Medicine

## 2019-05-26 VITALS — BP 126/74 | HR 82 | Temp 97.9°F | Ht 68.5 in | Wt 160.2 lb

## 2019-05-26 DIAGNOSIS — R300 Dysuria: Secondary | ICD-10-CM | POA: Diagnosis not present

## 2019-05-26 DIAGNOSIS — R2689 Other abnormalities of gait and mobility: Secondary | ICD-10-CM

## 2019-05-26 DIAGNOSIS — G4700 Insomnia, unspecified: Secondary | ICD-10-CM

## 2019-05-26 LAB — CBC WITH DIFFERENTIAL/PLATELET
Basophils Absolute: 0.1 10*3/uL (ref 0.0–0.1)
Basophils Relative: 0.9 % (ref 0.0–3.0)
Eosinophils Absolute: 0.2 10*3/uL (ref 0.0–0.7)
Eosinophils Relative: 2.2 % (ref 0.0–5.0)
HCT: 41.5 % (ref 36.0–46.0)
Hemoglobin: 13.8 g/dL (ref 12.0–15.0)
Lymphocytes Relative: 23 % (ref 12.0–46.0)
Lymphs Abs: 2.2 10*3/uL (ref 0.7–4.0)
MCHC: 33.1 g/dL (ref 30.0–36.0)
MCV: 92.6 fl (ref 78.0–100.0)
Monocytes Absolute: 0.9 10*3/uL (ref 0.1–1.0)
Monocytes Relative: 8.8 % (ref 3.0–12.0)
Neutro Abs: 6.3 10*3/uL (ref 1.4–7.7)
Neutrophils Relative %: 65.1 % (ref 43.0–77.0)
Platelets: 292 10*3/uL (ref 150.0–400.0)
RBC: 4.49 Mil/uL (ref 3.87–5.11)
RDW: 13.9 % (ref 11.5–15.5)
WBC: 9.7 10*3/uL (ref 4.0–10.5)

## 2019-05-26 LAB — POC URINALSYSI DIPSTICK (AUTOMATED)
Bilirubin, UA: NEGATIVE
Glucose, UA: NEGATIVE
Ketones, UA: NEGATIVE
Nitrite, UA: NEGATIVE
Protein, UA: NEGATIVE
Spec Grav, UA: 1.015 (ref 1.010–1.025)
Urobilinogen, UA: 0.2 E.U./dL
pH, UA: 6.5 (ref 5.0–8.0)

## 2019-05-26 LAB — COMPREHENSIVE METABOLIC PANEL
ALT: 5 U/L (ref 0–35)
AST: 15 U/L (ref 0–37)
Albumin: 4.1 g/dL (ref 3.5–5.2)
Alkaline Phosphatase: 79 U/L (ref 39–117)
BUN: 20 mg/dL (ref 6–23)
CO2: 29 mEq/L (ref 19–32)
Calcium: 9.1 mg/dL (ref 8.4–10.5)
Chloride: 106 mEq/L (ref 96–112)
Creatinine, Ser: 0.9 mg/dL (ref 0.40–1.20)
GFR: 62.84 mL/min (ref >60.00)
Glucose, Bld: 93 mg/dL (ref 70–99)
Potassium: 4.8 mEq/L (ref 3.5–5.1)
Sodium: 140 mEq/L (ref 135–145)
Total Bilirubin: 0.8 mg/dL (ref 0.2–1.2)
Total Protein: 6.7 g/dL (ref 6.0–8.3)

## 2019-05-26 MED ORDER — ESZOPICLONE 2 MG PO TABS
ORAL_TABLET | ORAL | 5 refills | Status: DC
Start: 2019-05-26 — End: 2019-11-13

## 2019-05-26 MED ORDER — SULFAMETHOXAZOLE-TRIMETHOPRIM 400-80 MG PO TABS: 1.0000 | ORAL_TABLET | Freq: Two times a day (BID) | ORAL | 0 refills | Status: DC

## 2019-05-26 NOTE — Patient Instructions (Signed)
Drink plenty of water and start the antibiotics today.  Price check lunesta.  Go to the lab on the way out.  We'll contact you with your lab report. Take care.

## 2019-05-26 NOTE — Progress Notes (Signed)
Insomnia.  She tried trazodone but didn't sleep well with that.  She noted a stuffy nose with the medicine and can't tolerate it.  Updated rx sent for lunesta.  She is going to check on goodrx about cost options.    Lower abd discomfort/pressure.  U/a abnormal.  No FCNAVD.  No burning with urination but had frequency, change in voiding from baseline.  She is a little better today with inc water intake.    She is not having chest pain or shortness of breath.  She does not have any new focal neurologic issues but she has had some episodic changes in balance that she notices when walking downstairs or looking downward.  This is been going on for approximately 1 year per patient report.  She has not passed out.  She usually uses a rail going down steps. She doesn't have sx with rolling over in bed.  She doesn't drink etoh.    Meds, vitals, and allergies reviewed.   ROS: Per HPI unless specifically indicated in ROS section   GEN: nad, alert and oriented HEENT: ncat, TM w/o erythema B NECK: supple w/o LA, no bruit CV: rrr. PULM: ctab, no inc wob ABD: soft, +bs, not tender to palpation. EXT: no edema SKIN: no acute rash DHP neg. CN 2-12 wnl B, S/S/DTR wnl x4 Normal gait.

## 2019-05-27 DIAGNOSIS — R300 Dysuria: Secondary | ICD-10-CM | POA: Insufficient documentation

## 2019-05-27 DIAGNOSIS — R2689 Other abnormalities of gait and mobility: Secondary | ICD-10-CM | POA: Insufficient documentation

## 2019-05-27 NOTE — Assessment & Plan Note (Signed)
Presumed UTI.  Start Septra.  Check urine culture.  Routine cautions given.  Okay for outpatient follow-up.  She agrees.

## 2019-05-27 NOTE — Assessment & Plan Note (Signed)
She did not tolerate trazodone.  She will price check Lunesta.  See above.  I resent the prescription to reset the prior authorization given that she cannot tolerate trazodone. >25 minutes spent in face to face time with patient, >50% spent in counselling or coordination of care.

## 2019-05-27 NOTE — Assessment & Plan Note (Signed)
Unclear source.  No carotid bruit.  No focal changes.  No emergent symptoms.  She does not have symptoms suggestive of vertebrobasilar insufficiency that she notices when she looks up.  She only occasionally has symptoms when she is looking downward going down steps.  At this point still okay for outpatient follow-up.  See notes on labs.

## 2019-05-29 LAB — URINE CULTURE
MICRO NUMBER:: 688793
SPECIMEN QUALITY:: ADEQUATE

## 2019-06-08 ENCOUNTER — Telehealth: Payer: Self-pay | Admitting: Family Medicine

## 2019-06-08 NOTE — Telephone Encounter (Signed)
I'll check the hard copy when possible.  Thanks.

## 2019-06-08 NOTE — Telephone Encounter (Signed)
Placed paperwork in Dr. Josefine Class inbox for review

## 2019-06-08 NOTE — Telephone Encounter (Signed)
Pt dropped off ppw from insurance regarding Eszopiclone. She is requesting it to be reviewed to see if an exception can be made. Placed PPW in [place] tower.

## 2019-06-12 NOTE — Telephone Encounter (Signed)
Will try to get exception approved with insurance and will then call patient

## 2019-06-16 NOTE — Telephone Encounter (Signed)
I called several numbers for insurance. Found out that provider has to do an appeal for Lunesta to see if insurance will approve. Can not resubmit a PA if it has been less than 60 days from original PA because she had Medicare. Representative states they would need supporting documents-which I told rep that she has been taking this medication since 2012 per patient and I do not know if we can mail or fax all of those records. We could start with a letter attention to Part D Appeal and Grievance Department and it needs to include her Member ID number which is [id number]. Patient has tried OTC Tylenol PM before and Trazodone. Sending to Dr. Damita Dunnings. Fax # is [phone].

## 2019-06-17 NOTE — Telephone Encounter (Signed)
I'll work on this as soon as I can.

## 2019-06-21 NOTE — Telephone Encounter (Addendum)
Letter done.  Please make sure it printed and I will sign it.  Thanks.

## 2019-06-22 NOTE — Telephone Encounter (Signed)
Faxed to number provided below.

## 2019-06-23 NOTE — Telephone Encounter (Addendum)
Santiago Glad with [employer] left a voicemail stating that she was calling about a PA that was submitted.  Santiago Glad requested a call back at [phone] EXT [phone], when calling back use case number [id number].

## 2019-06-24 NOTE — Telephone Encounter (Signed)
Have form that was faxed on this. I called and left Santiago Glad a message about this. There are questions on the form that need to be answered by Dr Damita Dunnings and will not be able to do this until he is back in the office. I left detailed messge on karen's voicemail letting her know this. Thank you. I am holding on to the form until Dr Damita Dunnings is back

## 2019-06-30 NOTE — Telephone Encounter (Signed)
Pt left v/m requesting cb for status of paperwork for authorization for special exception for [facility]. Pt dropped off paperwork 3 wks ago.

## 2019-06-30 NOTE — Telephone Encounter (Signed)
Left detailed message on voicemail.  

## 2019-06-30 NOTE — Telephone Encounter (Signed)
See prev messages.  Was out of clinic last week and I'm working on paperwork as fast as patient care allows. Thanks.

## 2019-07-01 NOTE — Telephone Encounter (Signed)
Follow up form done.  Please fax in.  Thanks.

## 2019-07-02 NOTE — Telephone Encounter (Signed)
Form faxed and scanned.

## 2019-07-06 ENCOUNTER — Other Ambulatory Visit: Payer: Self-pay

## 2019-07-06 ENCOUNTER — Ambulatory Visit
Admission: RE | Admit: 2019-07-06 | Discharge: 2019-07-06 | Disposition: A | Discharge status: Home / Self Care | Payer: Medicare Other | Source: Ambulatory Visit | Attending: Obstetrics and Gynecology | Admitting: Obstetrics and Gynecology

## 2019-07-06 DIAGNOSIS — Z1231 Encounter for screening mammogram for malignant neoplasm of breast: Secondary | ICD-10-CM

## 2019-08-04 DIAGNOSIS — M67449 Ganglion, unspecified hand: Secondary | ICD-10-CM | POA: Insufficient documentation

## 2019-08-04 DIAGNOSIS — M720 Palmar fascial fibromatosis [Dupuytren]: Secondary | ICD-10-CM | POA: Insufficient documentation

## 2019-08-04 DIAGNOSIS — M79641 Pain in right hand: Secondary | ICD-10-CM | POA: Insufficient documentation

## 2019-08-18 ENCOUNTER — Telehealth: Payer: Self-pay | Admitting: Family Medicine

## 2019-08-18 NOTE — Telephone Encounter (Signed)
Best number [phone]  Pt called wanting to get medication for uti she has pressure/pain little bit of blood when she wipes.  Light pink.  We didn't have any openings today.  Pt stated [facility] called [facility] had appointment today 6:30 pt declined she could not go then conflict with grandchildrend.  She has appointment @8 :15 with [facility] tomorrow am  She wanted to know if you would call her in something till she could be seen  [facility]  Pt is aware we don't usually call anything in for uti until pt has been seen.  I gave her [facility] walkin phone number pt

## 2019-08-19 NOTE — Telephone Encounter (Signed)
Called patient today. She did go to [facility] in [place] this morning and was giving antibiotic. Patient will let us know if she has any issues.

## 2019-08-19 NOTE — Telephone Encounter (Signed)
Noted. Thanks.

## 2019-09-22 ENCOUNTER — Encounter: Payer: Self-pay | Admitting: Emergency Medicine

## 2019-09-22 ENCOUNTER — Other Ambulatory Visit: Payer: Self-pay

## 2019-09-22 ENCOUNTER — Ambulatory Visit
Admission: EM | Admit: 2019-09-22 | Discharge: 2019-09-22 | Disposition: A | Discharge status: Home / Self Care | Payer: Medicare Other | Attending: Emergency Medicine | Admitting: Emergency Medicine

## 2019-09-22 ENCOUNTER — Telehealth: Payer: Self-pay | Admitting: *Deleted

## 2019-09-22 DIAGNOSIS — R079 Chest pain, unspecified: Secondary | ICD-10-CM

## 2019-09-22 NOTE — ED Provider Notes (Signed)
Roderic Palau    CSN: [id number] Arrival date & time: 09/22/19  1322      History   Chief Complaint Chief Complaint  Patient presents with  . Chest Pain    HPI Wendy Pruitt is a 63 y.o. female.   Patient presents with intermittent chest pain x1 day.  None currently; patient is currently asymptomatic.  She states the pain is sharp; midsternal; last for <1 second; occurs one time an hour; non-radiating; no associated symptoms.  Patient denies alleviating or aggravating factors.  She denies weakness, numbness, dizziness, shortness of breath, nausea, diaphoresis, or other symptoms.  The history is provided by the patient.    Past Medical History:  Diagnosis Date  . Glaucoma, both eyes   . History of left breast cancer 2004--- per pt no recurrence   dx DCIS left breast s/p  total mastectomy w/ reconstruction,  NO chemo or radiation therpy  (ER and PR negative)  . History of Paget's disease of breast   . Insomnia   . PONV (postoperative nausea and vomiting)   . VIN II (vulvar intraepithelial neoplasia II)     Patient Active Problem List   Diagnosis Date Noted  . Dysuria 05/27/2019  . Balance problem 05/27/2019  . Glaucoma 10/20/2017  . Advance care planning 09/08/2014  . History of breast cancer in female 03/13/2012  . Routine general medical examination at a health care facility 02/29/2012  . Insomnia 02/29/2012  . Constipation 02/29/2012  . NEOP, MALIGNANT, FEMALE BREAST NOS 06/22/2008  . TOBACCO ABUSE 08/15/2007    Past Surgical History:  Procedure Laterality Date  . CATARACT EXTRACTION W/ INTRAOCULAR LENS IMPLANT  1990 approx.   " left eye I think"  . MASTECTOMY Left   . PARTIAL MASTECTOMY INCORPATING NIPPLE AREOLAR COMPLEX Left 12-28-2002   dr young   [facility]   hx paget's disease left nipple  . RE-EXCISION  PORTION OF THE LUMPECTOMY SITE Left 01-11-2003    dr young  [facility]  . TOTAL MASTECTOMY Left 02-16-2003  dr young [facility]   w/ AXILLARY LYMPH NODE  DISSECTION AND IMMEDIATE BREAST RECONSTRUCTION WITH SALINE IMPLANT  . VAGINAL HYSTERECTOMY  1983 approx.  Eugenie Norrie N/A 02/11/2018   Procedure: WIDE EXCISION VULVECTOMY;  Surgeon: Cheri Fowler, MD;  Location: [facility];  Service: Gynecology;  Laterality: N/A;    OB History   No obstetric history on file.      Home Medications    Prior to Admission medications   Medication Sig Start Date End Date Taking? Authorizing Provider  cromolyn (OPTICROM) 4 % ophthalmic solution Place 1 drop into both eyes daily as needed.    Yes [provider]  dorzolamide (TRUSOPT) 2 % ophthalmic solution Place 1 drop into the right eye 2 (two) times daily. 06/28/18  Yes [provider]  eszopiclone (LUNESTA) 2 MG TABS tablet TAKE 1/2 TO 1 TABLET BY MOUTH AT BEDTIME USE SPARINGLY AS NEEDED.  Intolerant of trazodone. 05/26/19  Yes Tonia Ghent, MD  latanoprost (XALATAN) 0.005 % ophthalmic solution Place 1 drop into both eyes at bedtime.    Yes [provider]  pimecrolimus (ELIDEL) 1 % cream Apply 1 application topically 2 (two) times daily.   Yes [provider]  sulfamethoxazole-trimethoprim (BACTRIM) 400-80 MG tablet Take 1 tablet by mouth 2 (two) times daily. 05/26/19   Tonia Ghent, MD  timolol (BETIMOL) 0.25 % ophthalmic solution Place 1 drop into the right eye every morning.  [provider]    Family History Family History  Problem Relation Age of Onset  . Heart disease Mother        CHF  . Heart disease Father        MI  . Drug abuse Brother        In [place]; (drugs)  . Heart disease Brother        CAD (valve surg)  . Cancer Sister        lung cancer  . Cancer Brother   . Heart disease Sister        heart failure  . Breast cancer Daughter   . Breast cancer Maternal Grandmother   . Colon cancer Neg Hx     Social History Social History   Tobacco Use  . Smoking status: Current Every Day Smoker    Packs/day: 0.75     Years: 30.00    Pack years: 22.50    Types: Cigarettes  . Smokeless tobacco: Never Used  Substance Use Topics  . Alcohol use: Not Currently    Alcohol/week: 0.0 standard drinks    Comment: occassionally  . Drug use: No     Allergies   Chantix [varenicline], Trazodone and nefazodone, and Wellbutrin [bupropion]   Review of Systems Review of Systems  Constitutional: Negative for chills and fever.  HENT: Negative for ear pain and sore throat.   Eyes: Negative for pain and visual disturbance.  Respiratory: Negative for cough and shortness of breath.   Cardiovascular: Positive for chest pain. Negative for palpitations and leg swelling.  Gastrointestinal: Negative for abdominal pain and vomiting.  Genitourinary: Negative for dysuria and hematuria.  Musculoskeletal: Negative for arthralgias and back pain.  Skin: Negative for color change and rash.  Neurological: Negative for dizziness, tremors, seizures, syncope, facial asymmetry, speech difficulty, weakness, light-headedness, numbness and headaches.  All other systems reviewed and are negative.    Physical Exam Triage Vital Signs ED Triage Vitals  Enc Vitals Group     BP      Pulse      Resp      Temp      Temp src      SpO2      Weight      Height      Head Circumference      Peak Flow      Pain Score      Pain Loc      Pain Edu?      Excl. in [place]?    No data found.  Updated Vital Signs BP 112/75   Temp 98.2 F (36.8 C)   Resp 18   Wt 162 lb (73.5 kg)   SpO2 97%   BMI 24.27 kg/m   Visual Acuity Right Eye Distance:   Left Eye Distance:   Bilateral Distance:    Right Eye Near:   Left Eye Near:    Bilateral Near:     Physical Exam Vitals signs and nursing note reviewed.  Constitutional:      General: She is not in acute distress.    Appearance: She is well-developed. She is not ill-appearing.  HENT:     Head: Normocephalic and atraumatic.     Mouth/Throat:     Mouth: Mucous membranes are  moist.     Pharynx: Oropharynx is clear.  Eyes:     Conjunctiva/sclera: Conjunctivae normal.  Neck:     Musculoskeletal: Neck supple.  Cardiovascular:     Rate and Rhythm: Normal rate  and regular rhythm.     Heart sounds: Normal heart sounds. No murmur.  Pulmonary:     Effort: Pulmonary effort is normal. No respiratory distress.     Breath sounds: Normal breath sounds.  Abdominal:     General: Bowel sounds are normal.     Palpations: Abdomen is soft.     Tenderness: There is no abdominal tenderness. There is no guarding or rebound.  Musculoskeletal:     Right lower leg: No edema.     Left lower leg: No edema.  Skin:    General: Skin is warm and dry.  Neurological:     General: No focal deficit present.     Mental Status: She is alert and oriented to person, place, and time.     Sensory: No sensory deficit.     Motor: No weakness.     Gait: Gait normal.  Psychiatric:        Mood and Affect: Mood normal.        Behavior: Behavior normal.      UC Treatments / Results  Labs (all labs ordered are listed, but only abnormal results are displayed) Labs Reviewed - No data to display  EKG   Radiology No results found.  Procedures Procedures (including critical care time)  Medications Ordered in UC Medications - No data to display  Initial Impression / Assessment and Plan / UC Course  I have reviewed the triage vital signs and the nursing notes.  Pertinent labs & imaging results that were available during my care of the patient were reviewed by me and considered in my medical decision making (see chart for details).    Chest pain.  Patient is well-appearing and currently asymptomatic.  EKG shows sinus rhythm, rate 79, no ST elevation, no previous to compare.  Patient refuses to go to the emergency department.  Discussed with patient that there are some cardiovascular events that do not show on an EKG.  Instructed patient to go to the emergency department if she has chest  pain or other concerning symptoms.  Instructed her to follow-up with her PCP tomorrow.  Patient agrees to plan of care.     Final Clinical Impressions(s) / UC Diagnoses   Final diagnoses:  Chest pain, unspecified type     Discharge Instructions     Go to the emergency department if you have chest pain.    Follow-up with your primary care provider tomorrow.      ED Prescriptions    None     PDMP not reviewed this encounter.   Sharion Balloon, NP 09/22/19 1431

## 2019-09-22 NOTE — Telephone Encounter (Signed)
Noted. Thanks.  Will await consult note.  
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
## 2019-09-22 NOTE — Discharge Instructions (Signed)
Go to the emergency department if you have chest pain.    Follow-up with your primary care provider tomorrow.

## 2019-09-22 NOTE — ED Triage Notes (Signed)
Patient in office today c/o mid chest pain. No tingling no n/v sx 09/21/2019.  Patient but she slept comfort w/o being awoken today sx came back  [id number]

## 2019-09-23 ENCOUNTER — Telehealth: Payer: Self-pay | Admitting: Family Medicine

## 2019-09-23 DIAGNOSIS — R079 Chest pain, unspecified: Secondary | ICD-10-CM

## 2019-09-23 NOTE — Telephone Encounter (Signed)
Noted. Thanks.

## 2019-09-23 NOTE — Telephone Encounter (Signed)
Patient stated that she was in the urgent care for chest pain yesterday. She called [facility] on [address] and they requested that the patient call our office to get a referral. Can you put in a referral for the patient?       Wendy Pruitt,  Patient was not happy that her has a provider that she can not see.  I explained to the patient that the reason they sent her over to an urgent care yesterday was because we did not have any appointments in the office and did not want to delay care for her.  Patient stated that she pays the "copay" to come to this office and she can not even be seen.  She stated she was ready to speak to a supervisor over our office due to this. Patient is very upset that the referral process is not going to happen right away because this is very important that she see a cardiologist because she knows something is going on with her heart, Patient was not happy with her office because she stated this happens all the time. And wanted to know if Dr Damita Dunnings is always out. I tried to explain to patient that Wednesdays he is out of office. But yesterday the reason for not being able to be seen in the office was not due to being out. We just did not have appointments at the time and the best thing for [place] to do was advise a urgent care because with chest pains that is something we do not want the patient to wait to be seen.

## 2019-09-23 NOTE — Progress Notes (Signed)
Cardiology Office Note:   Date:  09/24/2019  NAME:  Wendy Pruitt    MRN: [id number] DOB:  [birth year]   PCP:  Tonia Ghent, MD  Cardiologist:  No primary care provider on file.   Referring MD: Tonia Ghent, MD   Chief Complaint  Patient presents with  . Chest Pain   History of Present Illness:   Wendy Pruitt is a 63 y.o. female with a hx of glaucoma who is being seen today for the evaluation of chest pain at the request of Tonia Ghent, MD. Evaluated in urgent care 11/17 for chest pain. Normal EKG. Follow-up with [place] today.  She reports on 11/16 she began to have stabbing chest pain.  She reports the pain would last seconds and occur several times during the day.  She reports no triggering factors and no alleviating factor she reports the pain which is simply resolved without intervention.  She reports she did start to take Prilosec with some alleviation pain but still getting it.  She does report a burping sensation after eating as well as metallic taste in her mouth.  She reports that she has a heavy smoking history nearly a pack a day for 30 years.  Review of lipid profile from PCP office shows an LDL of 114 earlier this year.  She is not diabetic.  She does have a history of myocardial infarction in her father who died at 69.  She is quite active and exercises several times per week.  She reports she can walk 1 to 2 miles without any major limitations.  She does not have high blood pressure, but main risk factors are smoking.  She reports that associated symptoms with the pain include a sensation of trouble breathing.  She denies any orthopnea, PND, lower extreme edema.    Past Medical History: Past Medical History:  Diagnosis Date  . Glaucoma, both eyes   . History of left breast cancer 2004--- per pt no recurrence   dx DCIS left breast s/p  total mastectomy w/ reconstruction,  NO chemo or radiation therpy  (ER and PR negative)  . History of Paget's disease of breast    . Insomnia   . PONV (postoperative nausea and vomiting)   . VIN II (vulvar intraepithelial neoplasia II)     Past Surgical History: Past Surgical History:  Procedure Laterality Date  . CATARACT EXTRACTION W/ INTRAOCULAR LENS IMPLANT  1990 approx.   " left eye I think"  . MASTECTOMY Left   . PARTIAL MASTECTOMY INCORPATING NIPPLE AREOLAR COMPLEX Left 12-28-2002   dr young   [facility]   hx paget's disease left nipple  . RE-EXCISION  PORTION OF THE LUMPECTOMY SITE Left 01-11-2003    dr young  [facility]  . TOTAL MASTECTOMY Left 02-16-2003  dr [facility]   w/ AXILLARY LYMPH NODE DISSECTION AND IMMEDIATE BREAST RECONSTRUCTION WITH SALINE IMPLANT  . VAGINAL HYSTERECTOMY  1983 approx.  Eugenie Norrie N/A 02/11/2018   Procedure: WIDE EXCISION VULVECTOMY;  Surgeon: Cheri Fowler, MD;  Location: [facility];  Service: Gynecology;  Laterality: N/A;    Current Medications: Current Meds  Medication Sig  . cromolyn (OPTICROM) 4 % ophthalmic solution Place 1 drop into both eyes daily as needed.   . dorzolamide (TRUSOPT) 2 % ophthalmic solution Place 1 drop into the right eye 2 (two) times daily.  . eszopiclone (LUNESTA) 2 MG TABS tablet TAKE 1/2 TO 1 TABLET BY MOUTH AT BEDTIME USE SPARINGLY AS  NEEDED.  Intolerant of trazodone.  . latanoprost (XALATAN) 0.005 % ophthalmic solution Place 1 drop into both eyes at bedtime.   . pimecrolimus (ELIDEL) 1 % cream Apply 1 application topically 2 (two) times daily.  Marland Kitchen sulfamethoxazole-trimethoprim (BACTRIM) 400-80 MG tablet Take 1 tablet by mouth 2 (two) times daily.  . timolol (BETIMOL) 0.25 % ophthalmic solution Place 1 drop into the right eye every morning.      Allergies:    Chantix [varenicline], Trazodone and nefazodone, and Wellbutrin [bupropion]   Social History: Social History   Socioeconomic History  . Marital status: Divorced    Spouse name: Not on file  . Number of children: 2  . Years of education: Not on file  . Highest education  level: Not on file  Occupational History  . Occupation: Development worker, international aid: UNEMPLOYED  Social Needs  . Financial resource strain: Not on file  . Food insecurity    Worry: Not on file    Inability: Not on file  . Transportation needs    Medical: Not on file    Non-medical: Not on file  Tobacco Use  . Smoking status: Current Every Day Smoker    Packs/day: 0.75    Years: 30.00    Pack years: 22.50    Types: Cigarettes  . Smokeless tobacco: Never Used  Substance and Sexual Activity  . Alcohol use: Not Currently    Alcohol/week: 0.0 standard drinks    Comment: occassionally  . Drug use: No  . Sexual activity: Not on file  Lifestyle  . Physical activity    Days per week: Not on file    Minutes per session: Not on file  . Stress: Not on file  Relationships  . Social Herbalist on phone: Not on file    Gets together: Not on file    Attends religious service: Not on file    Active member of club or organization: Not on file    Attends meetings of clubs or organizations: Not on file    Relationship status: Not on file  Other Topics Concern  . 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       Family History: The patient's family history includes Breast cancer in her daughter and maternal grandmother; Cancer in her brother and sister; Drug abuse in her brother; Heart disease in her brother, father, and sister; Heart disease (age of onset: 76) in her mother. There is no history of Colon cancer.  ROS:   All other ROS reviewed and negative. Pertinent positives noted in the HPI.     EKGs/Labs/Other Studies Reviewed:   The following studies were personally reviewed by me today:  EKG:  EKG reviewed from urgent care visit on 09/22/2019 demonstrates normal sinus rhythm with right axis deviation, no acute ST-T changes, no evidence  of prior infarction  Recent Labs: 05/26/2019: ALT 5; BUN 20; Creatinine, Ser 0.90; Hemoglobin 13.8; Platelets 292.0; Potassium 4.8; Sodium 140   Recent Lipid Panel    Component Value Date/Time   CHOL 170 12/02/2018 0800   TRIG 82.0 12/02/2018 0800   HDL 39.80 12/02/2018 0800   CHOLHDL 4 12/02/2018 0800   VLDL 16.4 12/02/2018 0800   LDLCALC 114 (H) 12/02/2018 0800    Physical Exam:   VS:  BP 112/78  Pulse 93   Temp (!) 97 F (36.1 C)   Ht 5\' 8"  (1.727 m)   Wt 161 lb (73 kg)   SpO2 96%   BMI 24.48 kg/m    Wt Readings from Last 3 Encounters:  09/24/19 161 lb (73 kg)  09/22/19 162 lb (73.5 kg)  05/26/19 160 lb 3 oz (72.7 kg)    General: Well nourished, well developed, in no acute distress Heart: Atraumatic, normal size  Eyes: PEERLA, EOMI  Neck: Supple, no JVD Endocrine: No thryomegaly Cardiac: Normal S1, S2; RRR; no murmurs, rubs, or gallops Lungs: Clear to auscultation bilaterally, no wheezing, rhonchi or rales  Abd: Soft, nontender, no hepatomegaly  Ext: No edema, pulses 2+ Musculoskeletal: No deformities, BUE and BLE strength normal and equal Skin: Warm and dry, no rashes   Neuro: Alert and oriented to person, place, time, and situation, CNII-XII grossly intact, no focal deficits  Psych: Normal mood and affect   ASSESSMENT:   JADINE BRAMMER is a 63 y.o. female who presents for the following: 1. Chest pain, unspecified type   2. Tobacco abuse     PLAN:   1. Chest pain, unspecified type -She presents with atypical chest pain.  This appears to have partially improved with Prilosec.  I do wonder if this is GERD.  She does have considerable risk factors for cardiovascular disease including heavy smoking history.  I think it is reasonable to get echocardiogram and a coronary CTA.  I think the coronary CTA will provide information on her anatomy as well as coronary calcium.  This will help [place] better decide on her future risk and current issues of chest pain.  We will  continue to [facility] for now.  I will see her back in 3 months to discuss these results and if any intervention needs to be pursued  2. Tobacco abuse -Counseled on importance of smoking cessation   Disposition: Return in about 3 months (around 12/25/2019).  Medication Adjustments/Labs and Tests Ordered: Current medicines are reviewed at length with the patient today.  Concerns regarding medicines are outlined above.  Orders Placed This Encounter  Procedures  . CT CORONARY MORPH W/CTA COR W/SCORE W/CA W/CM &/OR WO/CM  . CT CORONARY FRACTIONAL FLOW RESERVE DATA PREP  . CT CORONARY FRACTIONAL FLOW RESERVE FLUID ANALYSIS  . Basic metabolic panel  . ECHOCARDIOGRAM COMPLETE   Meds ordered this encounter  Medications  . metoprolol tartrate (LOPRESSOR) 100 MG tablet    Sig: Take 1 tablet by mouth once two hours before procedure.    Dispense:  1 tablet    Refill:  0    Patient Instructions  Medication Instructions:  Take Metoprolol 100 mg two hours before the CT when scheduled.  *If you need a refill on your cardiac medications before your next appointment, please call your pharmacy*  Lab Work: BMET one week before CT when scheduled.  If you have labs (blood work) drawn today and your tests are completely normal, you will receive your results only by: Marland Kitchen MyChart Message (if you have MyChart) OR . A paper copy in the mail If you have any lab test that is abnormal or we need to change your treatment, we will call you to review the results.  Testing/Procedures: Echocardiogram - Your physician has requested that you have an echocardiogram. Echocardiography is a painless test that uses sound waves to create images of your heart. It provides your doctor with information about the size and shape of your heart and  how well your heart's chambers and valves are working. This procedure takes approximately one hour. There are no restrictions for this procedure. This will be performed at our [facility] location - [address].  Your physician has requested that you have cardiac CT. Cardiac computed tomography (CT) is a painless test that uses an x-ray machine to take clear, detailed pictures of your heart. For further information please visit HugeFiesta.[email or web address]. Please follow instruction sheet as given.    Follow-Up: At [facility], you and your health needs are our priority.  As part of our continuing mission to provide you with exceptional heart care, we have created designated Provider Care Teams.  These Care Teams include your primary Cardiologist (physician) and Advanced Practice Providers (APPs -  Physician Assistants and Nurse Practitioners) who all work together to provide you with the care you need, when you need it.  Your next appointment:   3 month(s)  The format for your next appointment:   In Person  Provider:   Eleonore Chiquito, MD  Other Instructions Your cardiac CT will be scheduled at one of the below locations:   [facility] [address] [place], [place] [address] [phone]  [facility] [address] [place], [place] [address] [phone]  If scheduled at [facility], please arrive at the [facility] main entrance of [facility] 30-45 minutes prior to test start time. Proceed to the [facility] Radiology Department (first floor) to check-in and test prep.  If scheduled at [facility], please arrive 15 mins early for check-in and test prep.  Please follow these instructions carefully (unless otherwise directed):  Hold all erectile dysfunction medications at least 3 days (72 hrs) prior to test.  On the Night Before the Test: . Be sure to Drink plenty of water. . Do not consume any caffeinated/decaffeinated beverages or chocolate 12 hours prior to your test. . Do not take any antihistamines 12 hours prior to your test.  On  the Day of the Test: . Drink plenty of water. Do not drink any water within one hour of the test. . Do not eat any food 4 hours prior to the test. . You may take your regular medications prior to the test.  . Take metoprolol (Lopressor) two hours prior to test. . HOLD Furosemide/Hydrochlorothiazide morning of the test. . FEMALES- please wear underwire-free bra if available   *For Clinical Staff only. Please instruct patient the following:*        -Drink plenty of water       -Hold Furosemide/hydrochlorothiazide morning of the test       -Take metoprolol (Lopressor) 2 hours prior to test (if applicable).                  -If HR is less than 55 BPM- No Beta Blocker                -IF HR is greater than 55 BPM and patient is less than or equal to 65 yrs old Lopressor 100mg  x1.                -If HR is greater than 55 BPM and patient is greater than 46 yrs old Lopressor 50 mg x1.     Do not give Lopressor to patients with an allergy to lopressor or anyone with asthma or active COPD symptoms (currently taking steroids).  After the Test: . Drink plenty of water. . After receiving IV contrast, you may experience a mild flushed feeling. This is normal. . On occasion, you may experience a mild rash up to 24 hours after the test. This is not dangerous. If this occurs, you can take Benadryl 25 mg and increase your fluid intake. . If you experience trouble breathing, this can be serious. If it is severe call 911 IMMEDIATELY. If it is mild, please call our office. . If you take any of these medications: Glipizide/Metformin, Avandament, Glucavance, please do not take 48 hours after completing test unless otherwise instructed.   Once we have confirmed authorization from your insurance company, we will call you to set up a date and time for your test.   For non-scheduling related questions, please contact the cardiac imaging nurse navigator should you have any questions/concerns: Marchia Bond,  RN Navigator Cardiac Imaging [facility] Heart and Vascular Services [phone] Office        Signed, Addison Naegeli. Audie Box, [place]  [address] [place], [place] [address] [phone]  09/24/2019 8:57 AM

## 2019-09-23 NOTE — Telephone Encounter (Signed)
Spoke with patient at length this pm regarding her concerns.  She is very pleasant and appropriate.  She is very thankful and appreciative of the office and the care Dr. Damita Dunnings has provided.   Her concern is simply that she was discouraged when the past 2 times she has called in for a same day appointment, she was directed to the Urgent care.  In addition, she feels that since turning 63 this year (and with the terrible year COVID has presented) her health has declined and has added to her worries and fears.    We did discuss the last 2 attempts to get in same day with her provider and she verbalizes understanding as to why we had to refer her to UC each time. She knows that this could happen again as our schedules often fill up very quickly for same day acute appointments that we reserve for patients who call in.  We will make every attempt to get her in with someone here in our office if Dr. Damita Dunnings is unavailable unless her condition warrants emergent evaluation.  If we are unable to work in, then we will have to refer her to urgent care for eval and tx.   She is very appreciative of our rapid response today and thanks all involved with getting her the cardiology appointment which she will be seen tomorrow morning for further work up.   She is scared and was just feeling frustrated and apologizes for any misunderstanding.   She thanks [place] for the care we provide and all we do here at [facility] and knows that things have been difficult for [place] as well to navigate through this pandemic.   Patient knows that if she has any questions or concerns in the future to not hesitate to ask for me directly if need be.   FYI to Dr. Damita Dunnings.

## 2019-09-23 NOTE — Telephone Encounter (Signed)
She was given appropriate medical advice yesterday by our clinic.  I put in the referral for the patient.  I don't see the request in the chart from UC, but it is reasonable to proceed.    I am not in the office today but I am checking messages to address the referral in question.    I previously took time when I was not available in clinic (05/20/2019) to call her and address her concerns about a health condition.    She has apparently been recurrently unhappy with care from our clinic.  If that is the case, then recommend she find another clinic. I wish her the best.

## 2019-09-24 ENCOUNTER — Encounter: Payer: Self-pay | Admitting: Cardiovascular Disease

## 2019-09-24 ENCOUNTER — Other Ambulatory Visit: Payer: Self-pay

## 2019-09-24 ENCOUNTER — Ambulatory Visit: Payer: Medicare Other | Admitting: Cardiovascular Disease

## 2019-09-24 VITALS — BP 112/78 | HR 93 | Temp 97.0°F | Ht 68.0 in | Wt 161.0 lb

## 2019-09-24 DIAGNOSIS — R079 Chest pain, unspecified: Secondary | ICD-10-CM

## 2019-09-24 DIAGNOSIS — Z720 Tobacco use: Secondary | ICD-10-CM | POA: Diagnosis not present

## 2019-09-24 MED ORDER — METOPROLOL TARTRATE 100 MG PO TABS: ORAL_TABLET | ORAL | 0 refills | Status: DC

## 2019-09-24 NOTE — Patient Instructions (Addendum)
Medication Instructions:  Take Metoprolol 100 mg two hours before the CT when scheduled.  *If you need a refill on your cardiac medications before your next appointment, please call your pharmacy*  Lab Work: BMET one week before CT when scheduled.  If you have labs (blood work) drawn today and your tests are completely normal, you will receive your results only by: Marland Kitchen MyChart Message (if you have MyChart) OR . A paper copy in the mail If you have any lab test that is abnormal or we need to change your treatment, we will call you to review the results.  Testing/Procedures: Echocardiogram - Your physician has requested that you have an echocardiogram. Echocardiography is a painless test that uses sound waves to create images of your heart. It provides your doctor with information about the size and shape of your heart and how well your heart's chambers and valves are working. This procedure takes approximately one hour. There are no restrictions for this procedure. This will be performed at our [facility] location - [address].  Your physician has requested that you have cardiac CT. Cardiac computed tomography (CT) is a painless test that uses an x-ray machine to take clear, detailed pictures of your heart. For further information please visit HugeFiesta.[email or web address]. Please follow instruction sheet as given.    Follow-Up: At [facility], you and your health needs are our priority.  As part of our continuing mission to provide you with exceptional heart care, we have created designated Provider Care Teams.  These Care Teams include your primary Cardiologist (physician) and Advanced Practice Providers (APPs -  Physician Assistants and Nurse Practitioners) who all work together to provide you with the care you need, when you need it.  Your next appointment:   3 month(s)  The format for your next appointment:   In Person  Provider:   Eleonore Chiquito, MD  Other  Instructions Your cardiac CT will be scheduled at one of the below locations:   [facility] [address] [place], [place] [address] [phone]  [place] [address] [place], [place] [address] [phone]  If scheduled at [facility], please arrive at the [facility] main entrance of [facility] 30-45 minutes prior to test start time. Proceed to the [facility] Radiology Department (first floor) to check-in and test prep.  If scheduled at [facility], please arrive 15 mins early for check-in and test prep.  Please follow these instructions carefully (unless otherwise directed):  Hold all erectile dysfunction medications at least 3 days (72 hrs) prior to test.  On the Night Before the Test: . Be sure to Drink plenty of water. . Do not consume any caffeinated/decaffeinated beverages or chocolate 12 hours prior to your test. . Do not take any antihistamines 12 hours prior to your test.  On the Day of the Test: . Drink plenty of water. Do not drink any water within one hour of the test. . Do not eat any food 4 hours prior to the test. . You may take your regular medications prior to the test.  . Take metoprolol (Lopressor) two hours prior to test. . HOLD Furosemide/Hydrochlorothiazide morning of the test. . FEMALES- please wear underwire-free bra if available   *For Clinical Staff only. Please instruct patient the following:*        -Drink plenty of water       -Hold Furosemide/hydrochlorothiazide morning  of the test       -Take metoprolol (Lopressor) 2 hours prior to test (if applicable).                  -If HR is less than 55 BPM- No Beta Blocker                -IF HR is greater than 55 BPM and patient is less than or equal to 93 yrs old Lopressor 100mg  x1.                -If HR is greater than 55 BPM and patient is greater than 46 yrs old Lopressor 50 mg x1.      Do not give Lopressor to patients with an allergy to lopressor or anyone with asthma or active COPD symptoms (currently taking steroids).       After the Test: . Drink plenty of water. . After receiving IV contrast, you may experience a mild flushed feeling. This is normal. . On occasion, you may experience a mild rash up to 24 hours after the test. This is not dangerous. If this occurs, you can take Benadryl 25 mg and increase your fluid intake. . If you experience trouble breathing, this can be serious. If it is severe call 911 IMMEDIATELY. If it is mild, please call our office. . If you take any of these medications: Glipizide/Metformin, Avandament, Glucavance, please do not take 48 hours after completing test unless otherwise instructed.   Once we have confirmed authorization from your insurance company, we will call you to set up a date and time for your test.   For non-scheduling related questions, please contact the cardiac imaging nurse navigator should you have any questions/concerns: Marchia Bond, RN Navigator Cardiac Imaging [facility] [phone] Office

## 2019-10-07 ENCOUNTER — Ambulatory Visit (HOSPITAL_COMMUNITY): Payer: Medicare Other | Attending: Cardiovascular Disease

## 2019-10-07 ENCOUNTER — Other Ambulatory Visit: Payer: Self-pay

## 2019-10-07 DIAGNOSIS — Z720 Tobacco use: Secondary | ICD-10-CM

## 2019-10-07 DIAGNOSIS — R079 Chest pain, unspecified: Secondary | ICD-10-CM | POA: Diagnosis not present

## 2019-10-09 ENCOUNTER — Telehealth: Payer: Self-pay | Admitting: Cardiovascular Disease

## 2019-10-09 NOTE — Telephone Encounter (Signed)
Patient had an experience during her echo that seems out of the ordinary.   She was laying on her left side, and the tech reached under the table and the tech released something under the table that put a slamming pressure on her ribcage. The patient felt like it separated something . Now two days later she feels like it left a very painful bruise on her rib, maybe even broke one of her ribs. If the patient lays down or sits down , she has a hard time taking a deep breath.  The patient did have a mastectomy on her left side, and there is an implant in its place. She also has some scar tissue on the site, but the tech came in below the implant site.    She has never had an echo done before, so she does not know if that is normal. She also has a very high pain tolerance, but the result of this is just left her in excruciating pain. She just wants to know if she should have someone look at the area or what to do.

## 2019-10-09 NOTE — Telephone Encounter (Signed)
Spoke with pt, she has no visible bruising or swelling in the area. Will forward to vanessa in the echo department to follow up with the patient.

## 2019-10-13 ENCOUNTER — Other Ambulatory Visit: Payer: Self-pay

## 2019-10-13 DIAGNOSIS — R079 Chest pain, unspecified: Secondary | ICD-10-CM

## 2019-10-13 DIAGNOSIS — Z01812 Encounter for preprocedural laboratory examination: Secondary | ICD-10-CM

## 2019-10-14 ENCOUNTER — Telehealth: Payer: Self-pay

## 2019-10-14 NOTE — Telephone Encounter (Signed)
Called patient 10/13/19 with echo results.She stated when she was having echo the tech reached under table and released something that hit her in left rib area that caused her to almost lose her breath.Stated it was very painful,so painful it felt like ribs were broke.She did not mentioned to tech.Stated she has been taking Ibuprofen ever since and today is first day she has felt better.Advised I will make Dr.O'Neal aware and make Echo supervisor aware.

## 2019-10-20 LAB — BASIC METABOLIC PANEL
BUN/Creatinine Ratio: 23 (ref 12–28)
BUN: 17 mg/dL (ref 8–27)
CO2: 24 mmol/L (ref 20–29)
Calcium: 9.2 mg/dL (ref 8.7–10.3)
Chloride: 102 mmol/L (ref 96–106)
Creatinine, Ser: 0.73 mg/dL (ref 0.57–1.00)
GFR calc Af Amer: 100 mL/min/{1.73_m2} (ref >59)
GFR calc non Af Amer: 87 mL/min/{1.73_m2} (ref >59)
Glucose: 85 mg/dL (ref 65–99)
Potassium: 4.9 mmol/L (ref 3.5–5.2)
Sodium: 139 mmol/L (ref 134–144)

## 2019-10-21 ENCOUNTER — Other Ambulatory Visit: Payer: Self-pay

## 2019-10-21 ENCOUNTER — Other Ambulatory Visit: Payer: Self-pay | Admitting: Primary Care

## 2019-10-21 ENCOUNTER — Ambulatory Visit
Admission: RE | Admit: 2019-10-21 | Discharge: 2019-10-21 | Disposition: A | Discharge status: Home / Self Care | Payer: Medicare Other | Source: Ambulatory Visit | Attending: Primary Care | Admitting: Primary Care

## 2019-10-21 ENCOUNTER — Ambulatory Visit (INDEPENDENT_AMBULATORY_CARE_PROVIDER_SITE_OTHER): Payer: Medicare Other | Admitting: Primary Care

## 2019-10-21 ENCOUNTER — Other Ambulatory Visit: Payer: Self-pay | Admitting: Family Medicine

## 2019-10-21 ENCOUNTER — Encounter: Payer: Self-pay | Admitting: Primary Care

## 2019-10-21 VITALS — BP 120/76 | HR 92 | Temp 96.4°F | Ht 68.0 in | Wt 164.0 lb

## 2019-10-21 DIAGNOSIS — Z86718 Personal history of other venous thrombosis and embolism: Secondary | ICD-10-CM | POA: Insufficient documentation

## 2019-10-21 DIAGNOSIS — I82409 Acute embolism and thrombosis of unspecified deep veins of unspecified lower extremity: Secondary | ICD-10-CM | POA: Insufficient documentation

## 2019-10-21 DIAGNOSIS — M79661 Pain in right lower leg: Secondary | ICD-10-CM

## 2019-10-21 DIAGNOSIS — I82431 Acute embolism and thrombosis of right popliteal vein: Secondary | ICD-10-CM

## 2019-10-21 MED ORDER — RIVAROXABAN 20 MG PO TABS: 20.0000 mg | ORAL_TABLET | Freq: Every day | ORAL | 5 refills | Status: DC

## 2019-10-21 MED ORDER — RIVAROXABAN 15 MG PO TABS: 15.0000 mg | ORAL_TABLET | Freq: Two times a day (BID) | ORAL | 0 refills | Status: DC

## 2019-10-21 NOTE — Progress Notes (Signed)
   Subjective:    Patient ID: Wendy Pruitt, female    DOB: [birth year], 63 y.o.   MRN: [id number]  HPI  Ms. Mozley is a 62 year old female with a history of breast cancer, glaucoma, insomnia, balance issues, and tobacco abuse who presents today with a chief complaint of R calf pain.   One week ago she woke up with a dull, aching pain to her R calf. The pain starts just below the popliteal fossa and radiates down her calf. She thinks her R popliteal fossa is also swollen, which she noticed just last night. She took Ibuprofen 400 mg daily for 5 days with no relief. Elevated her RLE nightly and applied ice packs with some relief.  However, the pain returns shortly after. The isconstant, aggravating, she rates it as a 3-5/10.   She remains active, despite the pain, working two days a week. The pain is not worse with walking.   She denies injury, trauma, numbness, tingling, back pain, chest pain, shortness of breath, erythema or warmth to the site. She currently smokes 5 cigarettes a day.      Review of Systems  Constitutional: Negative for activity change.  Respiratory: Negative for shortness of breath.   Cardiovascular: Negative for chest pain.  Musculoskeletal: Positive for myalgias.       See HPI  Skin: Negative for color change.  Neurological: Negative for dizziness, weakness, numbness and headaches.       Objective:   Physical Exam Cardiovascular:     Pulses:          Dorsalis pedis pulses are 1+ on the right side and 1+ on the left side.  Musculoskeletal:     Right lower leg: No edema.     Left lower leg: No edema.     Right foot: Normal range of motion.     Left foot: Normal range of motion.  Feet:     Right foot:     Skin integrity: Skin integrity normal. No erythema or warmth.     Left foot:     Skin integrity: Skin integrity normal.  Skin:    General: Skin is warm and dry.  Neurological:     Mental Status: She is alert and oriented to person, place, and time.            Assessment & Plan:

## 2019-10-21 NOTE — Assessment & Plan Note (Addendum)
Acute x 1 week. No relief with NSAIDs, ice or elevation.   No alarm signs on exam. Recent BMP on 10/19/19 normal.   Given history of tobacco abuse and symptoms, will obtain ultrasound of RLE to rule out DVT. Consider ABI's but she really doesn't present with claudication symptoms.   Follow-up pending ultrasound results.  Exam today appears benign.  Agree with plan, Pleas Koch, NP   Update: Ultrasound positive for occlusive DVT to popliteal vein, will notify patient, initiate anticoagulation, and send to hematology as this DVT was unprovoked.

## 2019-10-21 NOTE — Progress Notes (Addendum)
Subjective:    Patient ID: Wendy Pruitt, female    DOB: 04-05-1956, 63 y.o.   MRN: [id number]  HPI   This visit occurred during the SARS-CoV-2 public health emergency.  Safety protocols were in place, including screening questions prior to the visit, additional usage of staff PPE, and extensive cleaning of exam room while observing appropriate contact time as indicated for disinfecting solutions.    [name] is a 63 year old female with a history of glaucoma, tobacco abuse, breast cancer who presents today with a chief complaint of calf pain.  Her pain is located to the popliteal fossa with radiation down to the right calf. Also with swelling to the popliteal fossa for which she noticed last night. Symptoms began one week ago when waking from sleep in the morning.   She's taken Ibuprofen 400 mg daily x 5 days without improvement. She's also elevated and applied ice to her extremity with temporary improvement. She describes her pain as constant "aggravating" pain. She is active despite symptoms, working two days weekly.  She denies numbness/trauma, back pain, knee pain, erythema, calf swelling, warmth to her leg, persistent pain with ambulation that is improved with rest. She is a smoker.  She was on a five hour plane ride to and from [place] in late October 2020, otherwise no sedentary activity. Denies family history of clotting disorders.   Review of Systems  Musculoskeletal: Positive for myalgias. Negative for arthralgias and back pain.  Skin: Negative for color change.  Neurological: Negative for weakness and numbness.       Past Medical History:  Diagnosis Date  . Glaucoma, both eyes   . History of left breast cancer 2004--- per pt no recurrence   dx DCIS left breast s/p  total mastectomy w/ reconstruction,  NO chemo or radiation therpy  (ER and PR negative)  . History of Paget's disease of breast   . Insomnia   . PONV (postoperative nausea and vomiting)   . VIN II (vulvar  intraepithelial neoplasia II)      Social History   Socioeconomic History  . Marital status: Divorced    Spouse name: Not on file  . Number of children: 2  . Years of education: Not on file  . Highest education level: Not on file  Occupational History  . Occupation: Development worker, international aid: UNEMPLOYED  Tobacco Use  . Smoking status: Current Every Day Smoker    Packs/day: 0.75    Years: 30.00    Pack years: 22.50    Types: Cigarettes  . Smokeless tobacco: Never Used  Substance and Sexual Activity  . Alcohol use: Not Currently    Alcohol/week: 0.0 standard drinks    Comment: occassionally  . Drug use: No  . Sexual activity: Not on file  Other Topics Concern  . 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     Social Determinants of Health   Financial Resource Strain:   . Difficulty of Paying Living Expenses: Not on file  Food Insecurity:   . Worried About Charity fundraiser in the Last Year: Not on file  . Ran Out of Food in the Last Year: Not on file  Transportation Needs:   . Lack of Transportation (Medical): Not on file  . Lack of Transportation (Non-Medical): Not on file  Physical Activity:   . Days of Exercise per Week: Not on file  . Minutes of Exercise per Session: Not on file  Stress:   . Feeling of Stress : Not on file  Social Connections:   . Frequency of Communication with Friends and Family: Not on file  . Frequency of Social Gatherings with Friends and Family: Not on file  . Attends Religious Services: Not on file  . Active Member of Clubs or Organizations: Not on file  . Attends Archivist Meetings: Not on file  . Marital Status: Not on file  Intimate Partner Violence:   . Fear of Current or Ex-Partner: Not on file  . Emotionally Abused: Not on file  . Physically Abused: Not on file  . Sexually  Abused: Not on file    Past Surgical History:  Procedure Laterality Date  . CATARACT EXTRACTION W/ INTRAOCULAR LENS IMPLANT  1990 approx.   " left eye I think"  . MASTECTOMY Left   . PARTIAL MASTECTOMY INCORPATING NIPPLE AREOLAR COMPLEX Left 12-28-2002   dr young   [facility]   hx paget's disease left nipple  . RE-EXCISION  PORTION OF THE LUMPECTOMY SITE Left 01-11-2003    dr young  [facility]  . TOTAL MASTECTOMY Left 02-16-2003  dr young [facility]   w/ AXILLARY LYMPH NODE DISSECTION AND IMMEDIATE BREAST RECONSTRUCTION WITH SALINE IMPLANT  . VAGINAL HYSTERECTOMY  1983 approx.  Eugenie Norrie N/A 02/11/2018   Procedure: WIDE EXCISION VULVECTOMY;  Surgeon: Cheri Fowler, MD;  Location: [facility];  Service: Gynecology;  Laterality: N/A;    Family History  Problem Relation Age of Onset  . Heart disease Mother 32       CHF  . Heart disease Father        MI  . Drug abuse Brother        In [place]; (drugs)  . Heart disease Brother        CAD (valve surg)  . Cancer Sister        lung cancer  . Cancer Brother   . Heart disease Sister        heart failure  . Breast cancer Daughter   . Breast cancer Maternal Grandmother   . Colon cancer Neg Hx     Allergies  Allergen Reactions  . Chantix [Varenicline] Other (See Comments)    Intolerant- abnormal dreams.    . Trazodone And Nefazodone Other (See Comments)    Nasal congestion  . Wellbutrin [Bupropion] Other (See Comments)    Intolerant.      Current Outpatient Medications on File Prior to Visit  Medication Sig Dispense Refill  . cromolyn (OPTICROM) 4 % ophthalmic solution Place 1 drop into both eyes daily as needed.     . dorzolamide (TRUSOPT) 2 % ophthalmic solution Place 1 drop into the right eye 2 (two) times daily.  99  . eszopiclone (LUNESTA) 2 MG TABS tablet TAKE 1/2 TO 1 TABLET BY MOUTH AT BEDTIME USE SPARINGLY AS NEEDED.  Intolerant of trazodone. 30 tablet 5  . latanoprost (XALATAN) 0.005 % ophthalmic solution Place 1  drop into both eyes at bedtime.     . pimecrolimus (ELIDEL) 1 % cream Apply 1 application topically 2 (two) times daily.    . timolol (BETIMOL) 0.25 % ophthalmic solution Place 1 drop into the right eye every morning.     . metoprolol tartrate (LOPRESSOR) 100 MG tablet Take 1 tablet by mouth once two hours before procedure. (Patient not  taking: Reported on 10/21/2019) 1 tablet 0   No current facility-administered medications on file prior to visit.    BP 120/76   Pulse 92   Temp (!) 96.4 F (35.8 C) (Temporal)   Ht 5\' 8"  (1.727 m)   Wt 164 lb (74.4 kg)   SpO2 97%   BMI 24.94 kg/m    Objective:   Physical Exam  Constitutional: She appears well-nourished.  Cardiovascular:  Pulses:      Dorsalis pedis pulses are 1+ on the right side and 1+ on the left side.       Posterior tibial pulses are 1+ on the right side and 1+ on the left side.  Calf size equal  Musculoskeletal:     Right upper leg: No swelling, edema or tenderness.       Legs:  Skin: Skin is warm and dry. No erythema.           Assessment & Plan:

## 2019-10-21 NOTE — Patient Instructions (Addendum)
Stop by the front desk and speak with [facility] regarding your referral for ultrasound of the right leg.   Rest and elevate the affected painful area as needed.  Apply cold compresses intermittently as needed.  As pain recedes, begin normal activities as tolerated. Call if symptoms persist.   We will be in touch once we receive the results of your ultrasound.   It was a pleasure to see you today!

## 2019-10-22 ENCOUNTER — Ambulatory Visit: Payer: Medicare Other | Admitting: Family Medicine

## 2019-10-23 ENCOUNTER — Telehealth: Payer: Self-pay | Admitting: Cardiovascular Disease

## 2019-10-23 ENCOUNTER — Inpatient Hospital Stay: Payer: Medicare Other

## 2019-10-23 ENCOUNTER — Encounter: Payer: Self-pay | Admitting: Oncology

## 2019-10-23 ENCOUNTER — Other Ambulatory Visit: Payer: Self-pay

## 2019-10-23 ENCOUNTER — Inpatient Hospital Stay: Payer: Medicare Other | Attending: Oncology | Admitting: Oncology

## 2019-10-23 VITALS — BP 126/83 | HR 91 | Temp 97.3°F | Resp 16 | Wt 163.4 lb

## 2019-10-23 DIAGNOSIS — K5900 Constipation, unspecified: Secondary | ICD-10-CM | POA: Diagnosis not present

## 2019-10-23 DIAGNOSIS — H409 Unspecified glaucoma: Secondary | ICD-10-CM | POA: Insufficient documentation

## 2019-10-23 DIAGNOSIS — Z801 Family history of malignant neoplasm of trachea, bronchus and lung: Secondary | ICD-10-CM | POA: Insufficient documentation

## 2019-10-23 DIAGNOSIS — Z853 Personal history of malignant neoplasm of breast: Secondary | ICD-10-CM | POA: Insufficient documentation

## 2019-10-23 DIAGNOSIS — F17210 Nicotine dependence, cigarettes, uncomplicated: Secondary | ICD-10-CM | POA: Diagnosis not present

## 2019-10-23 DIAGNOSIS — Z8249 Family history of ischemic heart disease and other diseases of the circulatory system: Secondary | ICD-10-CM | POA: Insufficient documentation

## 2019-10-23 DIAGNOSIS — I82431 Acute embolism and thrombosis of right popliteal vein: Secondary | ICD-10-CM | POA: Insufficient documentation

## 2019-10-23 DIAGNOSIS — Z7901 Long term (current) use of anticoagulants: Secondary | ICD-10-CM | POA: Diagnosis not present

## 2019-10-23 DIAGNOSIS — I824Y1 Acute embolism and thrombosis of unspecified deep veins of right proximal lower extremity: Secondary | ICD-10-CM

## 2019-10-23 DIAGNOSIS — Z803 Family history of malignant neoplasm of breast: Secondary | ICD-10-CM | POA: Diagnosis not present

## 2019-10-23 DIAGNOSIS — Z79899 Other long term (current) drug therapy: Secondary | ICD-10-CM | POA: Diagnosis not present

## 2019-10-23 DIAGNOSIS — G4700 Insomnia, unspecified: Secondary | ICD-10-CM | POA: Diagnosis not present

## 2019-10-23 NOTE — Telephone Encounter (Signed)
Patient is calling to inform Dr. Audie Box she was diagnosed with a DVT in her lower right calf on 10/21/19. She is wanting to know if her upcoming CT scheduled for 11/02/19 needs to be rescheduled due to the fact. She states a detailed message can be left if she is not able to answer the phone.

## 2019-10-23 NOTE — Progress Notes (Signed)
Patient stated that she started to have pain on her right lower extremity and until this Wednesday she went to see her PCP. Patient had an ultrasound done and found a DVT. Patient is currently on Xarelto.

## 2019-10-23 NOTE — Telephone Encounter (Signed)
Called Wendy Pruitt and informed her it is ok to proceed with her cardiac CT. Her recent DVT will not influence this.   [place] T. Audie Box, [place]  [address], [place], [place] [address] [phone]  2:02 PM

## 2019-10-25 LAB — DRVVT CONFIRM: dRVVT Confirm: 1.2 ratio (ref 0.8–1.2)

## 2019-10-25 LAB — LUPUS ANTICOAGULANT
DRVVT: 67.6 s — ABNORMAL HIGH (ref 0.0–47.0)
PTT Lupus Anticoagulant: 45.6 s (ref 0.0–51.9)
Thrombin Time: 16.9 s (ref 0.0–23.0)
dPT Confirm Ratio: 0.69 Ratio (ref 0.00–1.40)
dPT: 56.9 s — ABNORMAL HIGH (ref 0.0–55.0)

## 2019-10-25 LAB — DRVVT MIX: dRVVT Mix: 56.3 s — ABNORMAL HIGH (ref 0.0–40.4)

## 2019-10-26 LAB — BETA-2-GLYCOPROTEIN I ABS, IGG/M/A
Beta-2 Glyco I IgG: <9 GPI IgG units (ref 0–20)
Beta-2-Glycoprotein I IgA: <9 GPI IgA units (ref 0–25)
Beta-2-Glycoprotein I IgM: <9 GPI IgM units (ref 0–32)

## 2019-10-26 LAB — CARDIOLIPIN ANTIBODIES, IGM+IGG
Anticardiolipin IgG: <9 GPL U/mL (ref 0–14)
Anticardiolipin IgM: <9 MPL U/mL (ref 0–12)

## 2019-10-27 NOTE — Telephone Encounter (Signed)
Spoke with pt, aware no problem having CT scan with DVT. Aware to make sure to inform the hospital staff she is on xarelto.

## 2019-10-28 ENCOUNTER — Telehealth (HOSPITAL_COMMUNITY): Payer: Self-pay | Admitting: Emergency Medicine

## 2019-10-28 NOTE — Telephone Encounter (Signed)
Reaching out to patient to offer assistance regarding upcoming cardiac imaging study; pt verbalizes understanding of appt date/time, parking situation and where to check in, pre-test NPO status and medications ordered, and verified current allergies; name and call back number provided for further questions should they arise Aella Ronda RN Navigator Cardiac Imaging [facility] [phone] office [phone] cell 

## 2019-10-30 ENCOUNTER — Encounter: Payer: Self-pay | Admitting: Oncology

## 2019-10-30 NOTE — Progress Notes (Signed)
Hematology/Oncology Consult note [facility] Telephone:([phone] Fax:([phone]) [phone]  Patient Care Team: Tonia Ghent, MD as PCP - General (Family Medicine)   Name of the patient: Wendy Pruitt  [id number]  1956/01/29    Reason for referral- acute VT   Referring physician- Dr. Damita Dunnings  Date of visit: 10/30/19   History of presenting illness- Patient is a 63 yr old female with no significant medical problems. She had sudden onset pain in her right calf about 2 weeks ago. After pain did not subside she went to urgent care and had USG doppler which showed occlusive DVT in the right poplitea, peroneal and tibial veins. She has not had any prior DVT. She does not use HRT. No significant pregnancy complications or family h/o DVT. She is currently on xarelto and tolerating it well without significant side effects. Right leg pain is easing off.   ECOG PS- 0  Pain scale- 0   Review of systems- Review of Systems  Constitutional: Negative for chills, fever, malaise/fatigue and weight loss.  HENT: Negative for congestion, ear discharge and nosebleeds.   Eyes: Negative for blurred vision.  Respiratory: Negative for cough, hemoptysis, sputum production, shortness of breath and wheezing.   Cardiovascular: Negative for chest pain, palpitations, orthopnea and claudication.  Gastrointestinal: Negative for abdominal pain, blood in stool, constipation, diarrhea, heartburn, melena, nausea and vomiting.  Genitourinary: Negative for dysuria, flank pain, frequency, hematuria and urgency.  Musculoskeletal: Negative for back pain, joint pain and myalgias.  Skin: Negative for rash.  Neurological: Negative for dizziness, tingling, focal weakness, seizures, weakness and headaches.  Endo/Heme/Allergies: Does not bruise/bleed easily.  Psychiatric/Behavioral: Negative for depression and suicidal ideas. The patient does not have insomnia.     Allergies  Allergen Reactions  .  Chantix [Varenicline] Other (See Comments)    Intolerant- abnormal dreams.    . Trazodone And Nefazodone Other (See Comments)    Nasal congestion  . Wellbutrin [Bupropion] Other (See Comments)    Intolerant.      Patient Active Problem List   Diagnosis Date Noted  . Right calf pain 10/21/2019  . Dysuria 05/27/2019  . Balance problem 05/27/2019  . Glaucoma 10/20/2017  . Advance care planning 09/08/2014  . History of breast cancer in female 03/13/2012  . Routine general medical examination at a health care facility 02/29/2012  . Insomnia 02/29/2012  . Constipation 02/29/2012  . NEOP, MALIGNANT, FEMALE BREAST NOS 06/22/2008  . TOBACCO ABUSE 08/15/2007     Past Medical History:  Diagnosis Date  . Glaucoma, both eyes   . History of left breast cancer 2004--- per pt no recurrence   dx DCIS left breast s/p  total mastectomy w/ reconstruction,  NO chemo or radiation therpy  (ER and PR negative)  . History of Paget's disease of breast   . Insomnia   . PONV (postoperative nausea and vomiting)   . VIN II (vulvar intraepithelial neoplasia II)      Past Surgical History:  Procedure Laterality Date  . CATARACT EXTRACTION W/ INTRAOCULAR LENS IMPLANT  1990 approx.   " left eye I think"  . MASTECTOMY Left   . PARTIAL MASTECTOMY INCORPATING NIPPLE AREOLAR COMPLEX Left 12-28-2002   dr young   [facility]   hx paget's disease left nipple  . RE-EXCISION  PORTION OF THE LUMPECTOMY SITE Left 01-11-2003    dr young  [facility]  . TOTAL MASTECTOMY Left 02-16-2003  dr young [facility]   w/ AXILLARY LYMPH NODE DISSECTION AND IMMEDIATE BREAST  RECONSTRUCTION WITH SALINE IMPLANT  . VAGINAL HYSTERECTOMY  1983 approx.  Eugenie Norrie N/A 02/11/2018   Procedure: WIDE EXCISION VULVECTOMY;  Surgeon: Cheri Fowler, MD;  Location: [facility];  Service: Gynecology;  Laterality: N/A;    Social History   Socioeconomic History  . Marital status: Divorced    Spouse name: Not on file  . Number of children: 2   . Years of education: Not on file  . Highest education level: Not on file  Occupational History  . Occupation: Development worker, international aid: UNEMPLOYED  Tobacco Use  . Smoking status: Current Every Day Smoker    Packs/day: 0.75    Years: 30.00    Pack years: 22.50    Types: Cigarettes  . Smokeless tobacco: Never Used  Substance and Sexual Activity  . Alcohol use: Not Currently    Alcohol/week: 0.0 standard drinks    Comment: occassionally  . Drug use: No  . Sexual activity: Not on file  Other Topics Concern  . 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     Social Determinants of Health   Financial Resource Strain:   . Difficulty of Paying Living Expenses: Not on file  Food Insecurity:   . Worried About Charity fundraiser in the Last Year: Not on file  . Ran Out of Food in the Last Year: Not on file  Transportation Needs:   . Lack of Transportation (Medical): Not on file  . Lack of Transportation (Non-Medical): Not on file  Physical Activity:   . Days of Exercise per Week: Not on file  . Minutes of Exercise per Session: Not on file  Stress:   . Feeling of Stress : Not on file  Social Connections:   . Frequency of Communication with Friends and Family: Not on file  . Frequency of Social Gatherings with Friends and Family: Not on file  . Attends Religious Services: Not on file  . Active Member of Clubs or Organizations: Not on file  . Attends Archivist Meetings: Not on file  . Marital Status: Not on file  Intimate Partner Violence:   . Fear of Current or Ex-Partner: Not on file  . Emotionally Abused: Not on file  . Physically Abused: Not on file  . Sexually Abused: Not on file     Family History  Problem Relation Age of Onset  . Heart disease Mother 6       CHF  . Heart disease Father        MI  . Drug abuse  Brother        In [place]; (drugs)  . Heart disease Brother        CAD (valve surg)  . Cancer Sister        lung cancer  . Cancer Brother   . Heart disease Sister        heart failure  . Breast cancer Daughter   . Breast cancer Maternal Grandmother   . Colon cancer Neg Hx      Current Outpatient Medications:  .  cromolyn (OPTICROM) 4 % ophthalmic solution, Place 1 drop into both eyes daily as needed. , Disp: , Rfl:  .  dorzolamide (TRUSOPT) 2 % ophthalmic solution, Place 1 drop into the right eye 2 (two) times daily., Disp: , Rfl: 99 .  eszopiclone (LUNESTA) 2 MG TABS tablet, TAKE 1/2 TO 1 TABLET BY MOUTH AT BEDTIME USE SPARINGLY AS NEEDED.  Intolerant of trazodone., Disp: 30 tablet, Rfl: 5 .  latanoprost (XALATAN) 0.005 % ophthalmic solution, Place 1 drop into both eyes at bedtime. , Disp: , Rfl:  .  metoprolol tartrate (LOPRESSOR) 100 MG tablet, Take 1 tablet by mouth once two hours before procedure., Disp: 1 tablet, Rfl: 0 .  pimecrolimus (ELIDEL) 1 % cream, Apply 1 application topically 2 (two) times daily., Disp: , Rfl:  .  Rivaroxaban (XARELTO) 15 MG TABS tablet, Take 1 tablet (15 mg total) by mouth 2 (two) times daily with a meal., Disp: 42 tablet, Rfl: 0 .  timolol (BETIMOL) 0.25 % ophthalmic solution, Place 1 drop into the right eye every morning. , Disp: , Rfl:    Physical exam:  Vitals:   10/23/19 1320  BP: 126/83  Pulse: 91  Resp: 16  Temp: (!) 97.3 F (36.3 C)  TempSrc: Tympanic  SpO2: 97%  Weight: 163 lb 6.4 oz (74.1 kg)   Physical Exam HENT:     Head: Normocephalic and atraumatic.  Eyes:     Pupils: Pupils are equal, round, and reactive to light.  Cardiovascular:     Rate and Rhythm: Normal rate and regular rhythm.     Heart sounds: Normal heart sounds.  Pulmonary:     Effort: Pulmonary effort is normal.     Breath sounds: Normal breath sounds.  Abdominal:     General: Bowel sounds are normal.     Palpations: Abdomen is soft.  Musculoskeletal:      Cervical back: Normal range of motion.     Right lower leg: No edema.     Left lower leg: No edema.     Comments: Tiny varicose veins noted over LLE.   Skin:    General: Skin is warm and dry.  Neurological:     Mental Status: She is alert and oriented to person, place, and time.        CMP Latest Ref Rng & Units 10/19/2019  Glucose 65 - 99 mg/dL 85  BUN 8 - 27 mg/dL 17  Creatinine 0.57 - 1.00 mg/dL 0.73  Sodium 134 - 144 mmol/L 139  Potassium 3.5 - 5.2 mmol/L 4.9  Chloride 96 - 106 mmol/L 102  CO2 20 - 29 mmol/L 24  Calcium 8.7 - 10.3 mg/dL 9.2  Total Protein 6.0 - 8.3 g/dL -  Total Bilirubin 0.2 - 1.2 mg/dL -  Alkaline Phos 39 - 117 U/L -  AST 0 - 37 U/L -  ALT 0 - 35 U/L -   CBC Latest Ref Rng & Units 05/26/2019  WBC 4.0 - 10.5 K/uL 9.7  Hemoglobin 12.0 - 15.0 g/dL 13.8  Hematocrit 36.0 - 46.0 % 41.5  Platelets 150.0 - 400.0 K/uL 292.0    No images are attached to the encounter.  US Venous Img Lower Unilateral Right  Result Date: 10/21/2019 CLINICAL DATA:  Right lower extremity pain and edema for the past week. History of breast cancer. Evaluate for DVT. EXAM: RIGHT LOWER EXTREMITY VENOUS DOPPLER ULTRASOUND TECHNIQUE: Gray-scale sonography with graded compression, as well as color Doppler and duplex ultrasound were performed to evaluate the lower extremity deep venous systems from the level of the common femoral vein and including the common femoral, femoral, profunda femoral, popliteal and calf veins including the posterior tibial, peroneal and gastrocnemius veins when visible. The superficial great saphenous vein was also interrogated. Spectral Doppler  was utilized to evaluate flow at rest and with distal augmentation maneuvers in the common femoral, femoral and popliteal veins. COMPARISON:  None. FINDINGS: Contralateral Common Femoral Vein: Respiratory phasicity is normal and symmetric with the symptomatic side. No evidence of thrombus. Normal compressibility. Common  Femoral Vein: No evidence of thrombus. Normal compressibility, respiratory phasicity and response to augmentation. Saphenofemoral Junction: No evidence of thrombus. Normal compressibility and flow on color Doppler imaging. Profunda Femoral Vein: No evidence of thrombus. Normal compressibility and flow on color Doppler imaging. Femoral Vein: No evidence of thrombus. Normal compressibility, respiratory phasicity and response to augmentation. Popliteal Vein: Hypoechoic occlusive thrombus is seen within lateral division of the duplicated right popliteal vein (images 26 and 30). The adjacent medial division of the duplicated popliteal vein appears patent where imaged (image 29). Calf Veins: Hypoechoic near occlusive thrombus is seen involving both the right peroneal and posterior tibial veins. Superficial Great Saphenous Vein: No evidence of thrombus. Normal compressibility. Other Findings:  None. IMPRESSION: The examination is positive for occlusive DVT involving one of the duplicated segments of the right popliteal vein extending to involve both the right peroneal and posterior tibial veins. Electronically Signed   By: Sandi Mariscal M.D.   On: 10/21/2019 11:56   ECHOCARDIOGRAM COMPLETE  Result Date: 10/07/2019   ECHOCARDIOGRAM REPORT   Patient Name:   BITA WAGLE Date of Exam: 10/07/2019 Medical Rec #:  [id number]       Height:       68.0 in Accession #:    [id number]      Weight:       161.0 lb Date of Birth:  [birth year]       BSA:          1.86 m Patient Age:    49 years        BP:           123/78 mmHg Patient Gender: F               HR:           73 bpm. Exam Location:  [facility] Procedure: 2D Echo, Cardiac Doppler and Color Doppler Indications:    R07.9* Chest pain, unspecified  History:        Patient has no prior history of Echocardiogram examinations.                 Risk Factors:Current Smoker and Family History of Coronary                 Artery Disease. History of left breast cancer.  Sonographer:     Diamond Nickel RCS Referring Phys: [id number] [place]  1. Left ventricular ejection fraction, by visual estimation, is 60 to 65%. The left ventricle has normal function. There is mildly increased left ventricular hypertrophy.  2. Global right ventricle has normal systolic function.The right ventricular size is normal. No increase in right ventricular wall thickness.  3. Left atrial size was normal.  4. Right atrial size was normal.  5. Mild mitral annular calcification.  6. The mitral valve is normal in structure. Mild mitral valve regurgitation. No evidence of mitral stenosis.  7. The tricuspid valve is normal in structure. Tricuspid valve regurgitation is mild.  8. The aortic valve is tricuspid. Aortic valve regurgitation is not visualized. Mild aortic valve sclerosis without stenosis.  9. The pulmonic valve was grossly normal. Pulmonic valve regurgitation is trivial. 10. Normal pulmonary artery systolic pressure. 11. The inferior vena  cava is normal in size with greater than 50% respiratory variability, suggesting right atrial pressure of 3 mmHg. FINDINGS  Left Ventricle: Left ventricular ejection fraction, by visual estimation, is 60 to 65%. The left ventricle has normal function. There is mildly increased left ventricular hypertrophy. Left ventricular diastolic parameters were normal. Normal left atrial  pressure. Right Ventricle: The right ventricular size is normal. No increase in right ventricular wall thickness. Global RV systolic function is has normal systolic function. The tricuspid regurgitant velocity is 2.12 m/s, and with an assumed right atrial pressure  of 10 mmHg, the estimated right ventricular systolic pressure is normal at 28.1 mmHg. Left Atrium: Left atrial size was normal in size. Right Atrium: Right atrial size was normal in size Pericardium: There is no evidence of pericardial effusion. Mitral Valve: The mitral valve is normal in structure. There is mild thickening of  the mitral valve leaflet(s). There is mild calcification of the mitral valve leaflet(s). Mild mitral annular calcification. No evidence of mitral valve stenosis by observation. Mild mitral valve regurgitation. Tricuspid Valve: The tricuspid valve is normal in structure. Tricuspid valve regurgitation is mild. Aortic Valve: The aortic valve is tricuspid. Aortic valve regurgitation is not visualized. Mild aortic valve sclerosis is present, with no evidence of aortic valve stenosis. Pulmonic Valve: The pulmonic valve was grossly normal. Pulmonic valve regurgitation is trivial. Aorta: The aortic root, ascending aorta and aortic arch are all structurally normal, with no evidence of dilitation or obstruction. Venous: The inferior vena cava is normal in size with greater than 50% respiratory variability, suggesting right atrial pressure of 3 mmHg. IAS/Shunts: No atrial level shunt detected by color flow Doppler. There is no evidence of a patent foramen ovale. No ventricular septal defect is seen or detected. There is no evidence of an atrial septal defect.  LEFT VENTRICLE PLAX 2D LVIDd:         4.10 cm  Diastology LVIDs:         2.60 cm  LV e' lateral:   11.03 cm/s LV PW:         1.50 cm  LV E/e' lateral: 7.3 LV IVS:        1.20 cm  LV e' medial:    7.65 cm/s LVOT diam:     1.85 cm  LV E/e' medial:  10.5 LV SV:         50 ml LV SV Index:   26.40 LVOT Area:     2.69 cm  RIGHT VENTRICLE RV Basal diam:  2.00 cm RV S prime:     7.72 cm/s TAPSE (M-mode): 1.0 cm RVSP:           21.1 mmHg LEFT ATRIUM             Index       RIGHT ATRIUM           Index LA diam:        3.00 cm 1.61 cm/m  RA Pressure: 3.00 mmHg LA Vol (A2C):   37.0 ml 19.85 ml/m RA Area:     11.20 cm LA Vol (A4C):   31.8 ml 17.06 ml/m RA Volume:   22.60 ml  12.13 ml/m LA Biplane Vol: 35.4 ml 18.99 ml/m  AORTIC VALVE LVOT Vmax:   88.80 cm/s LVOT Vmean:  58.100 cm/s LVOT VTI:    0.210 m  AORTA Ao Root diam: 3.10 cm MITRAL VALVE  TRICUSPID  VALVE MV Area (PHT): 4.15 cm             TR Peak grad:   18.1 mmHg MV PHT:        53.07 msec           TR Vmax:        235.00 cm/s MV Decel Time: 183 msec             Estimated RAP:  3.00 mmHg MV E velocity: 80.60 cm/s 103 cm/s  RVSP:           21.1 mmHg MV A velocity: 63.80 cm/s 70.3 cm/s MV E/A ratio:  1.26       1.5       SHUNTS                                     Systemic VTI:  0.21 m                                     Systemic Diam: 1.85 cm  Jenkins Rouge MD Electronically signed by Jenkins Rouge MD Signature Date/Time: 10/07/2019/11:11:31 AM    Final     Assessment and plan- Patient is a 63 y.o. female with newly diagnosed acute DVT of RLE  Right popliteal vein dvt constitutes proximal DVT. This appears to be unprovoked. I would recommend 6 months fo uninterrupted anticoagulation for this. With regards to hypercoagulable work up, patient does not wish to undergo genetic work up. She is agreeable to pursuing work up for antiphospholipid antibody syndrome which I will order.   I will calculate her HERDOO2 score after 6 months if she needs to continue anticoagulation longer. I will see her back in 6 months   Thank you for this kind referral and the opportunity to participate in the care of this  Patient   Visit Diagnosis 1. Acute deep vein thrombosis (DVT) of proximal end of right lower extremity ([place])     Dr. Randa Evens, MD, MPH [facility] [id number] 10/30/2019

## 2019-11-02 ENCOUNTER — Ambulatory Visit (HOSPITAL_COMMUNITY)
Admission: RE | Admit: 2019-11-02 | Discharge: 2019-11-02 | Disposition: A | Discharge status: Home / Self Care | Payer: Medicare Other | Source: Ambulatory Visit | Attending: Cardiovascular Disease | Admitting: Cardiovascular Disease

## 2019-11-02 ENCOUNTER — Telehealth: Payer: Self-pay | Admitting: Family Medicine

## 2019-11-02 ENCOUNTER — Other Ambulatory Visit: Payer: Self-pay

## 2019-11-02 ENCOUNTER — Telehealth: Payer: Self-pay | Admitting: Cardiovascular Disease

## 2019-11-02 DIAGNOSIS — Z720 Tobacco use: Secondary | ICD-10-CM | POA: Diagnosis present

## 2019-11-02 DIAGNOSIS — R079 Chest pain, unspecified: Secondary | ICD-10-CM | POA: Insufficient documentation

## 2019-11-02 DIAGNOSIS — Q245 Malformation of coronary vessels: Secondary | ICD-10-CM | POA: Diagnosis not present

## 2019-11-02 DIAGNOSIS — F17210 Nicotine dependence, cigarettes, uncomplicated: Secondary | ICD-10-CM | POA: Diagnosis not present

## 2019-11-02 DIAGNOSIS — I700 Atherosclerosis of aorta: Secondary | ICD-10-CM

## 2019-11-02 MED ORDER — NITROGLYCERIN 0.4 MG SL SUBL
SUBLINGUAL_TABLET | SUBLINGUAL | Status: AC
  Filled 2019-11-02: qty 2

## 2019-11-02 MED ORDER — RIVAROXABAN 20 MG PO TABS: 20.0000 mg | ORAL_TABLET | Freq: Every day | ORAL | 5 refills | Status: DC

## 2019-11-02 MED ORDER — NITROGLYCERIN 0.4 MG SL SUBL
0.8000 mg | SUBLINGUAL_TABLET | Freq: Once | SUBLINGUAL | Status: AC
  Administered 2019-11-02: 0.8 mg via SUBLINGUAL

## 2019-11-02 MED ORDER — ROSUVASTATIN CALCIUM 20 MG PO TABS: 20.0000 mg | ORAL_TABLET | Freq: Every day | ORAL | 3 refills | Status: DC

## 2019-11-02 MED ORDER — METOPROLOL TARTRATE 5 MG/5ML IV SOLN
INTRAVENOUS | Status: AC
  Filled 2019-11-02: qty 15

## 2019-11-02 MED ORDER — IOHEXOL 350 MG/ML SOLN
80.0000 mL | Freq: Once | INTRAVENOUS | Status: AC | PRN
  Administered 2019-11-02: 80 mL via INTRAVENOUS

## 2019-11-02 MED ORDER — METOPROLOL TARTRATE 5 MG/5ML IV SOLN
5.0000 mg | INTRAVENOUS | Status: DC | PRN
  Administered 2019-11-02 (×2): 5 mg via INTRAVENOUS

## 2019-11-02 NOTE — Telephone Encounter (Signed)
Per note in [place] result patient is to change dose to Xarelto 20 mg after finishing initial prescription but I do not see a RX for that. There is a 15 mg RX that was sent in on 10/20/2019

## 2019-11-02 NOTE — Progress Notes (Signed)
CT scan completed. Tolerated well. D/c home walking, awake and alert. In no distress

## 2019-11-02 NOTE — Telephone Encounter (Signed)
Usual dosing is 15 mg twice daily with food for 21 days followed by 20 mg once daily with food.  Bruising is common.  Would continue unless she has bleeding.  Thanks.

## 2019-11-02 NOTE — Telephone Encounter (Signed)
Patient called in regards to her medication that she was prescribed for the blood clot in her legh

## 2019-11-02 NOTE — Telephone Encounter (Signed)
Patient is requesting a call back She is wanting to discuss the dosage on her Xarelto. Patient thought that the dosage was going to be cut in half but the pharmacy did not receive a new prescription    Patients call was dropped before getting any more information

## 2019-11-02 NOTE — Telephone Encounter (Signed)
I had sent the rx for 20mg  tabs prev.  It was dc'd by Wayna Chalet, [place] on 10/23/2019 13:25.   I resent the rx.  Please update patient.  Thanks.

## 2019-11-02 NOTE — Telephone Encounter (Signed)
Patient advised and verbalized understanding 

## 2019-11-02 NOTE — Telephone Encounter (Signed)
Called Wendy Pruitt about CCTA. Normal coronary arteries. Aortic atherosclerosis noted. Needs statin. Will start crestor 20 mg QHS and then re-check lipid profile at our next visit. She was in agreement.   [place] T. Audie Box, [place]  [address], [place], [place] [address] [phone]  12:45 PM

## 2019-11-02 NOTE — Telephone Encounter (Signed)
Patient advised and verbalized understanding. Patient does want to let us know that the 15 mg 1 BID makes her bruise a lot. She is hoping that will improve at lower dose. She thought she was told she would be taking 15 mg 1 daily but states she is not 100% sure. I advised patient I can make sure with Dr. Damita Dunnings but RX was sent in for 20 mg 1 tablet daily.

## 2019-11-02 NOTE — Addendum Note (Signed)
Addended by: Tonia Ghent on: 11/02/2019 03:23 PM   Modules accepted: Orders

## 2019-11-04 ENCOUNTER — Telehealth: Payer: Self-pay | Admitting: *Deleted

## 2019-11-04 IMAGING — MG MM DIGITAL SCREENING UNILAT*R* W/ CAD
3 series · 3 of 3 positions shown · non-contrast
Comparison: Previous exam(s).

CLINICAL DATA: Screening.

EXAM:
DIGITAL SCREENING UNILATERAL RIGHT MAMMOGRAM WITH CAD

[R MLO (1 of 2)]
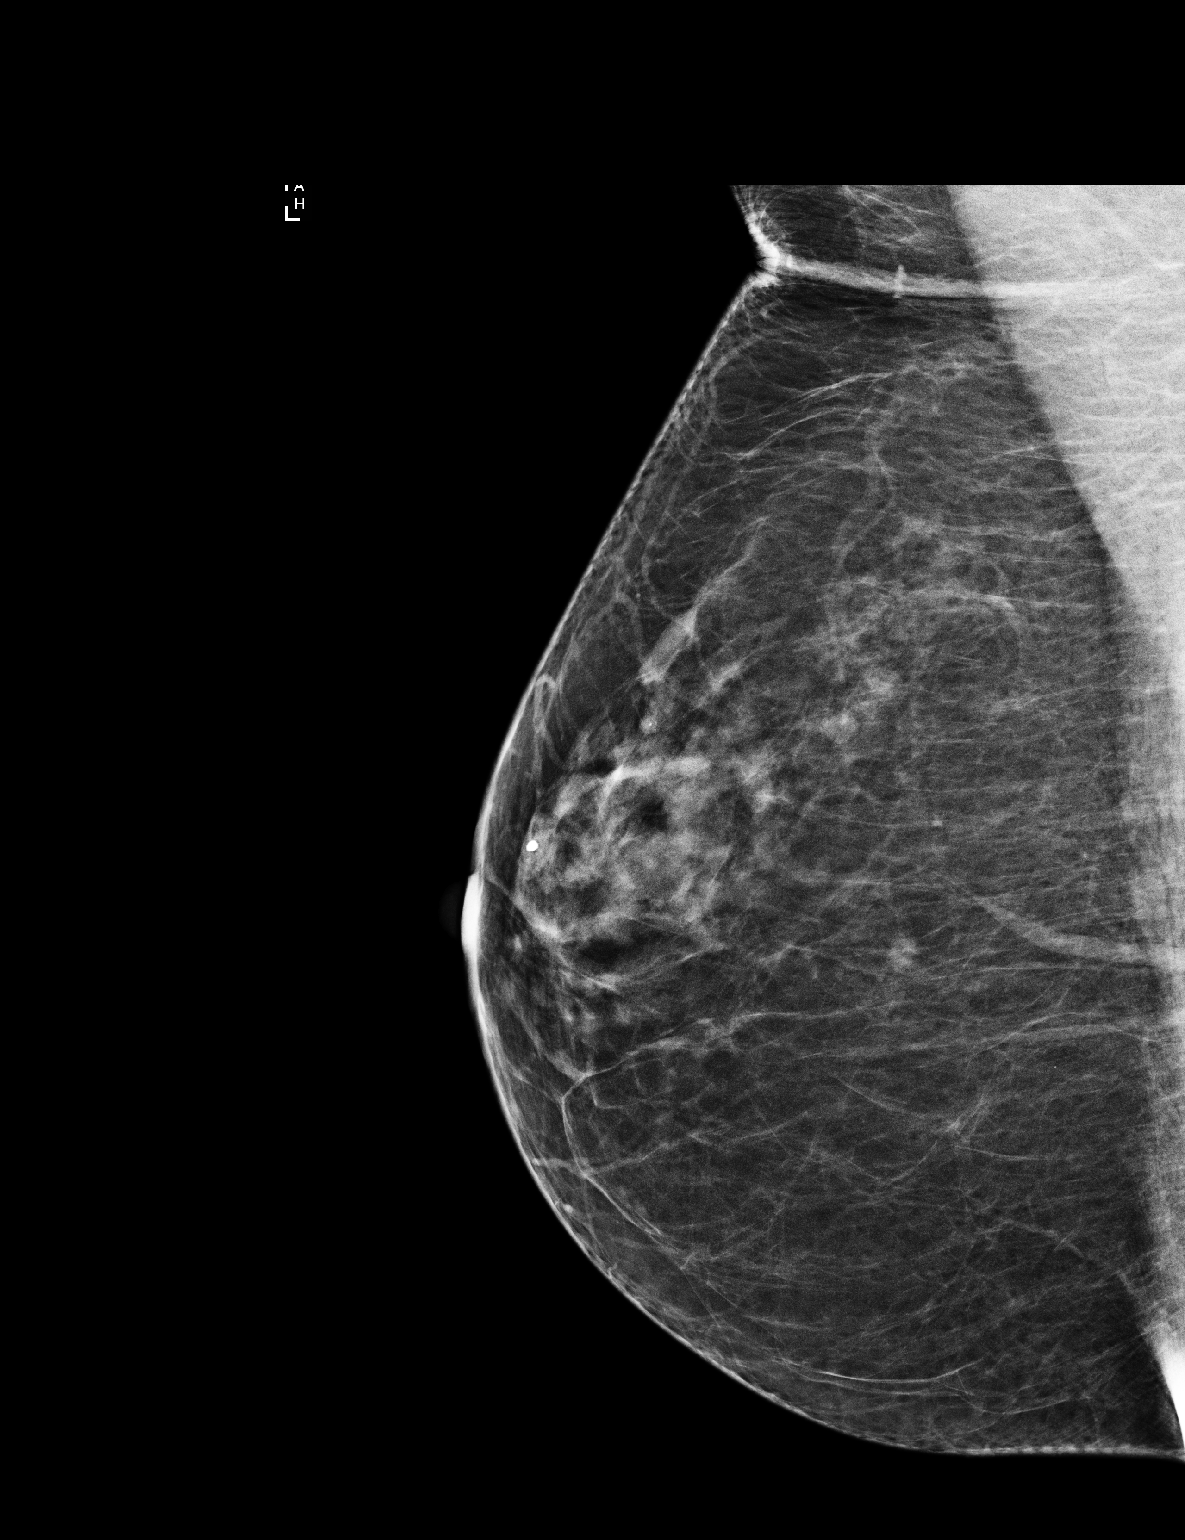

[R MLO (2 of 2)]
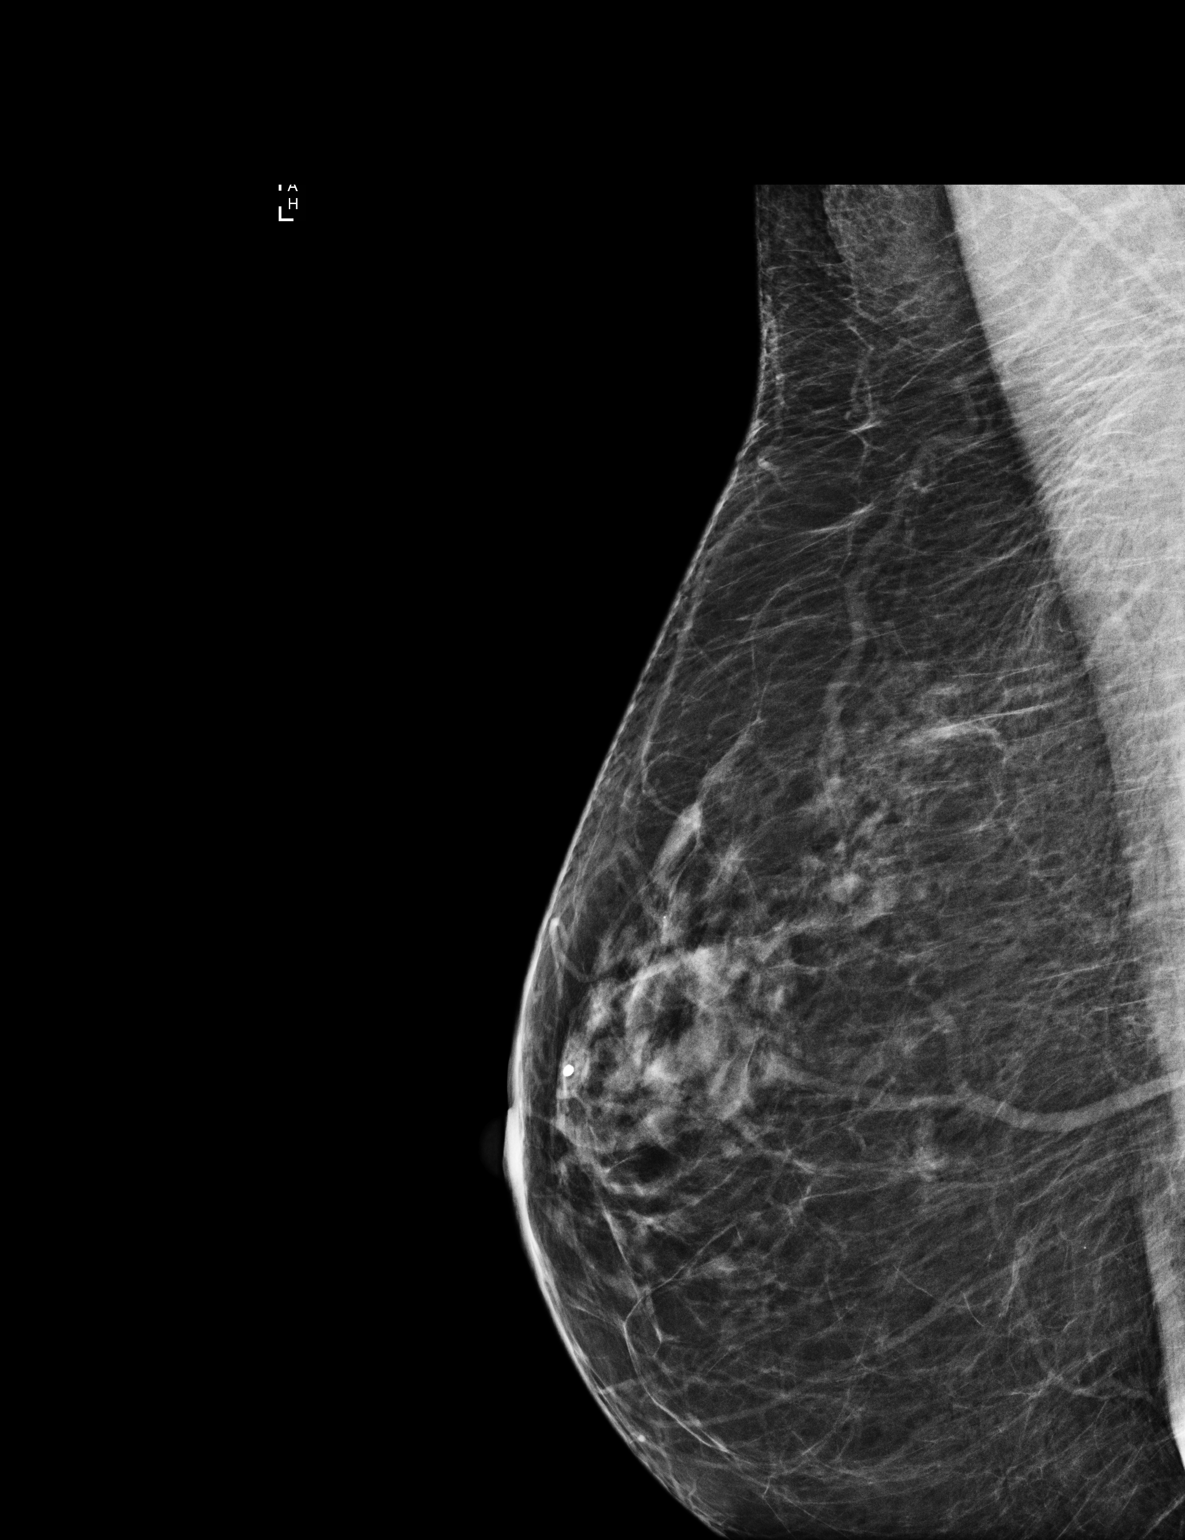

[R CC]
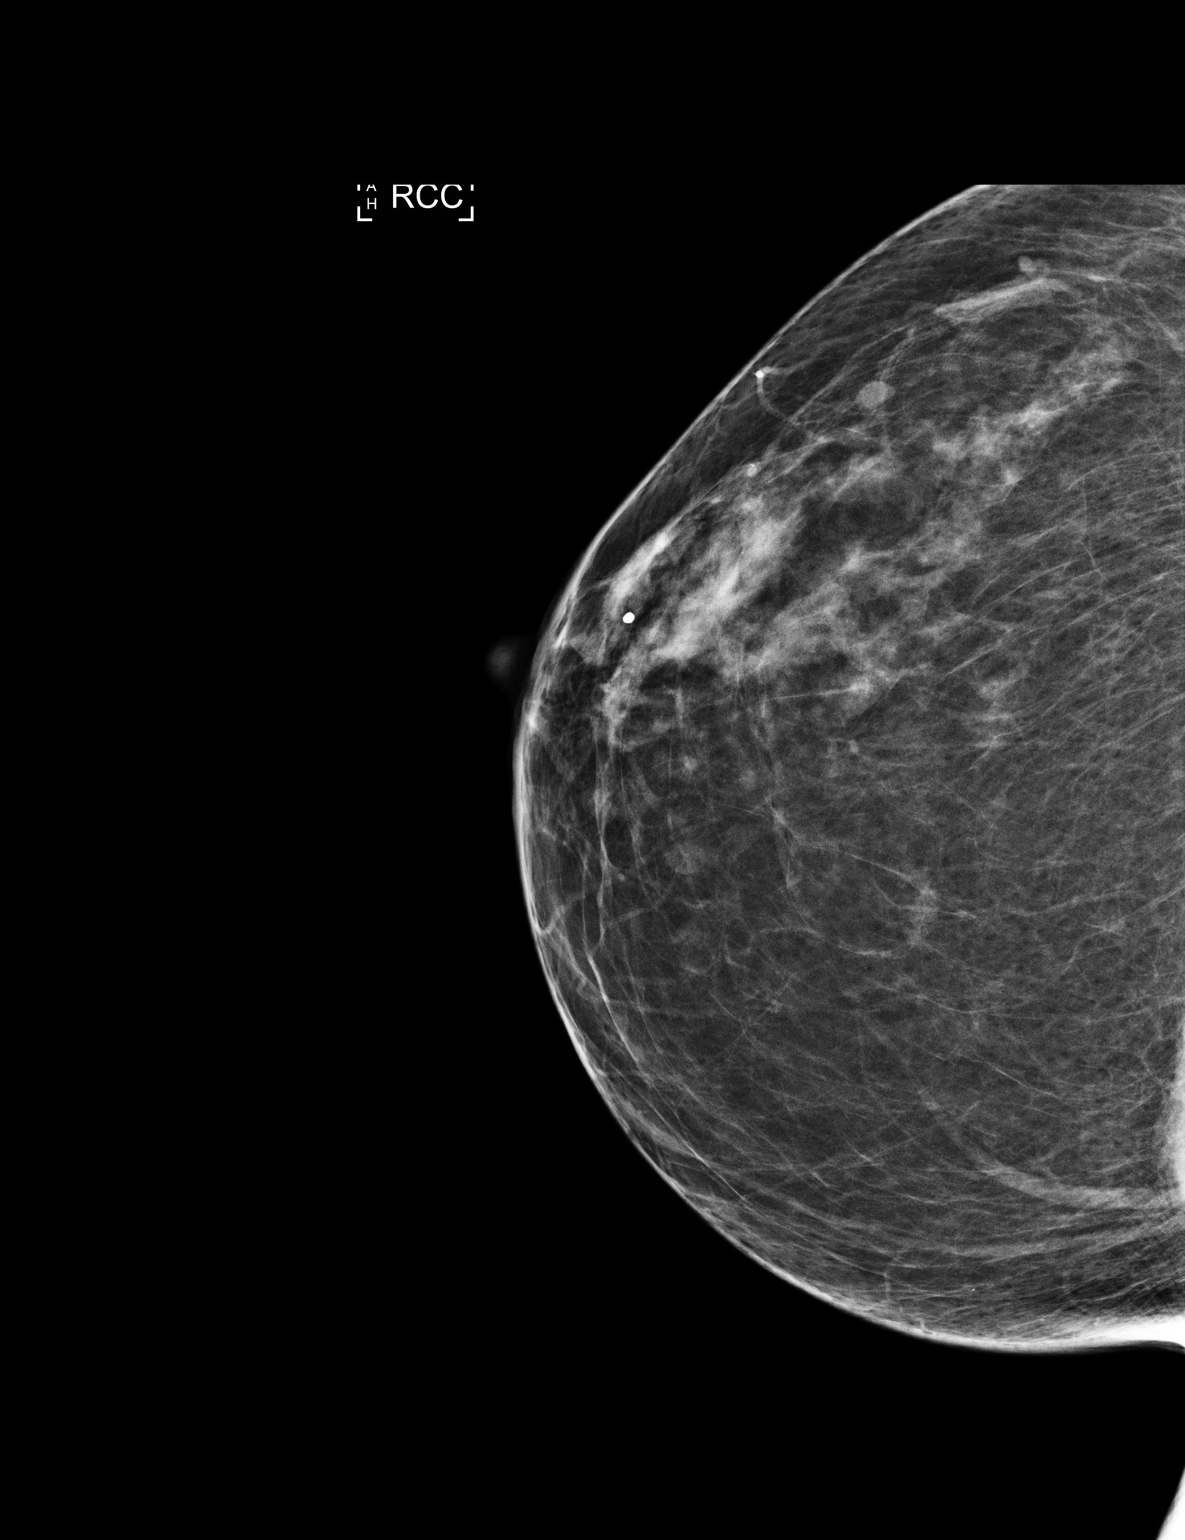

[3 of 3 positions shown; findings below may reference images not displayed]

ACR Breast Density Category b: There are scattered areas of
fibroglandular density.
FINDINGS: The patient has had a left mastectomy. There are no findings
suspicious for malignancy. Images were processed with CAD.
IMPRESSION: No mammographic evidence of malignancy. A result letter of this
screening mammogram will be mailed directly to the patient.

RECOMMENDATION:
Screening mammogram in one year.  (Code:HW-X-55W)

BI-RADS CATEGORY  1: Negative.

## 2019-11-04 NOTE — Telephone Encounter (Signed)
Patient called asking about her labs results and if there is any discussion that needs to take place. Please advise.   dRVVT Confirm Order: [id number] Status:  Final result  Visible to patient:  No (inaccessible in MyChart)  Next appt:  12/28/2019 at 08:00 AM in Cardiology Evalina Field, MD)  Ref Range & Units 12 d ago  dRVVT Confirm 0.8 - 1.2 ratio 1.2   Comment: (NOTE)  Performed At: [facility]  [place], [place] [id number]  Rush Farmer MD [id number]   Resulting Agency  [facility]      Specimen Collected: 10/23/19 14:22  Last Resulted: 10/25/19 11:36     Lab Flowsheet   Order Details   View Encounter   Lab and Collection Details   Routing   Result History         Other Results from 10/23/2019  Beta-2-glycoprotein arch  Status:  Final result  Visible to patient:  No (inaccessible in [place])  Next appt:  12/28/2019 at 08:00 AM in Cardiology Evalina Field, MD)  Dx:  Acute deep vein thrombosis (DVT) of p... Order: [id number]  Ref Range & Units 12 d ago  Beta-2 Glyco I IgG 0 - 20 GPI IgG units <9   Comment: (NOTE)  The reference interval reflects a 3SD or 99th percentile interval,  which is thought to represent a potentially clinically significant  result in accordance with the International Consensus Statement on  the classification criteria for definitive antiphospholipid  syndrome (APS). J Thromb Haem 2006;4:295-306.   Beta-2-Glycoprotein I IgM 0 - 32 GPI IgM units <9   Comment: (NOTE)  The reference interval reflects a 3SD or 99th percentile interval,  which is thought to represent a potentially clinically significant  result in accordance with the International Consensus Statement on  the classification criteria for definitive antiphospholipid  syndrome (APS). J Thromb Haem 2006;4:295-306.  Performed At: [facility]  [place], [place] [id number]  Rush Farmer MD [id number]    Beta-2-Glycoprotein I IgA 0 - 25 GPI IgA units <9   Comment: (NOTE)  The reference interval reflects a 3SD or 99th percentile interval,  which is thought to represent a potentially clinically significant  result in accordance with the International Consensus Statement on  the classification criteria for definitive antiphospholipid  syndrome (APS). J Thromb Haem 2006;4:295-306.   Resulting Agency  [facility]      Specimen Collected: 10/23/19 14:22  Last Resulted: 10/26/19 17:36     Lab Flowsheet   Order Details   View Encounter   Lab and Collection Details   Routing   Result History           Cardiolipin antibodies, IgM+IgG  Status:  Final result  Visible to patient:  No (inaccessible in MyChart)  Next appt:  12/28/2019 at 08:00 AM in Cardiology Evalina Field, MD)  Dx:  Acute deep vein thrombosis (DVT) of p... Order: [id number]  Ref Range & Units 12 d ago  Anticardiolipin IgG 0 - 14 GPL U/mL <9   Comment: (NOTE)              Negative:       <15              Indeterminate:   15 - 20              Low-Med Positive: >20 - 80              High  Positive:     >80   Anticardiolipin IgM 0 - 12 MPL U/mL <9   Comment: (NOTE)              Negative:       <13              Indeterminate:   13 - 20              Low-Med Positive: >20 - 80              High Positive:     >80  Performed At: [facility]  [place], [place] [id number]  Rush Farmer MD [id number]   Resulting Agency  [facility]      Specimen Collected: 10/23/19 14:22  Last Resulted: 10/26/19 15:36     Lab Flowsheet   Order Details   View Encounter   Lab and Collection Details   Routing   Result History           Contains abnormal data Lupus anticoagulant arch  Status:  Final result  Visible to patient:  No (inaccessible in MyChart)   Next appt:  12/28/2019 at 08:00 AM in Cardiology Evalina Field, MD)  Dx:  Acute deep vein thrombosis (DVT) of p... Order: [id number]  Ref Range & Units 12 d ago  dPT 0.0 - 55.0 sec 56.9High    dPT Confirm Ratio 0.00 - 1.40 Ratio 0.69   Thrombin Time 0.0 - 23.0 sec 16.9   PTT Lupus Anticoagulant 0.0 - 51.9 sec 45.6   DRVVT 0.0 - 47.0 sec 67.6High    Lupus Anticoag Interp  Comment:   Comment: (NOTE)  No lupus anticoagulant was detected. PTT-LA and dRVVT results are  consistent with specific inhibitors to one or more common pathway  factors  (X, V, II or fibrinogen). The dPT was extended but the dPT  confirmatory  ratio was normal. As antibody titers may fluctuate with time, repeat  testing may be indicated and ideally should be performed in the  absence of  anticoagulant therapy.  Performed At: [facility]  [place], [place] [id number]  Rush Farmer MD [id number]   Resulting Agency  [facility]      Specimen Collected: 10/23/19 14:22  Last Resulted: 10/25/19 11:35     Lab Flowsheet   Order Details   View Encounter   Lab and Collection Details   Routing   Result History           Contains abnormal data dRVVT Mix  Status:  Final result  Visible to patient:  No (inaccessible in MyChart)  Next appt:  12/28/2019 at 08:00 AM in Cardiology Evalina Field, MD) Order: [id number]  Ref Range & Units 12 d ago  dRVVT Mix 0.0 - 40.4 sec 56.3High    Comment: (NOTE)        **Please note reference interval change**  Performed At: [facility]  [place], [place] [id number]  Rush Farmer MD [id number]   Resulting Agency  [facility]      Specimen Collected: 10/23/19 14:22  Last Resulted: 10/25/19 11:36

## 2019-11-05 NOTE — Telephone Encounter (Signed)
Patient informed of doctor response and confirmed that she will keep her appointment in June 2021

## 2019-11-05 NOTE — Telephone Encounter (Signed)
Can you please let her know- based on her labs she does not have antiphospholipid antibody syndrome? Continue xarelto and see me in 6 months as planned

## 2019-11-08 ENCOUNTER — Other Ambulatory Visit: Payer: Self-pay | Admitting: Primary Care

## 2019-11-10 NOTE — Telephone Encounter (Signed)
Electronic refill request. Xarelto Last office visit:   05/26/2019 Last Filled:    30 tablet 5 11/02/2019   Please advise.

## 2019-11-11 ENCOUNTER — Other Ambulatory Visit: Payer: Self-pay | Admitting: Family Medicine

## 2019-11-11 NOTE — Telephone Encounter (Signed)
I denied the refill request for 15 mg tablets.  Please clarify with pharmacy.  She should be changing to the 20 mg dose after she finishes the initial 15 mg pills.  Thanks.

## 2019-11-12 NOTE — Telephone Encounter (Signed)
Electronic refill request. Wendy Pruitt Last office visit:   05/26/2019 Last Filled:     30 tablet 5 05/26/2019  Please advise.

## 2019-11-12 NOTE — Telephone Encounter (Signed)
[facility] (who [facility] [address] [facility].  [facility] [place] [facility] 15 mg [facility].  Spoke to [place].

## 2019-11-13 NOTE — Telephone Encounter (Signed)
Sent. Thanks.   

## 2019-11-24 ENCOUNTER — Telehealth: Payer: Self-pay

## 2019-11-24 NOTE — Telephone Encounter (Signed)
Submitted PA for Lunesta through Covermymeds. Awaiting reply from insurance. Patient tried and failed Trazodone. Has been taking Lunesta since at least 2012.

## 2019-11-24 NOTE — Telephone Encounter (Signed)
Thank you for working on this.  Please let me know if you need anything from me.

## 2019-11-25 NOTE — Telephone Encounter (Signed)
Received fax from insurance. PA denied because patient has to try 3 of these covered medications: Belsomra, Dayvigo and Rozerem. Or the doctor has to provide specific medical reasons why these covered drugs are not appropriate for the patient.  placing fax from insurance in Dr Josefine Class inbox. Patient is not advised yet.

## 2019-11-25 NOTE — Telephone Encounter (Signed)
[facility] Night - Client Nonclinical Telephone Record AccessNurse Client [facility] Night - Client Client Site [place] Physician Renford Dills - MD Contact Type Call Who Is Calling Physician / Provider / Hospital Call Type Provider Call Message Only Reason for Call Request to send message to Office Initial Comment Caller states she is with [facility] requesting information on prior authorization . Patient Wendy Pruitt DOB 01/22/1956. Caller is Leafy Ro [phone]. Additional Comment Disp. Time Disposition Final User 11/24/2019 5:07:59 PM General Information Provided Yes Ignacia Marvel Call Closed By: Ignacia Marvel Transaction Date/Time: 11/24/2019 5:04:40 PM (ET)

## 2019-11-26 NOTE — Telephone Encounter (Signed)
I will work on the hardcopy as soon as I can.  I routed this back to [employer] to stop as I will give her the hardcopy upon form completion.  Thanks.

## 2019-11-30 NOTE — Telephone Encounter (Signed)
Spoke with [place] when she called back. Advised per our record patient has tried and failed Trazodone and she has been on [place] since at least 2012. Marisue Brooklyn appreciated the information and will process information.

## 2019-11-30 NOTE — Telephone Encounter (Signed)
Marisue Brooklyn from pt's insurance called regarding appeal. They needed to know if pt has tried other medications for this. She can be reached at [phone] ext [phone]. The case number is [id number] and she states the case closes today.

## 2019-12-01 NOTE — Telephone Encounter (Signed)
Please let me know what you hear.  Thanks.  °

## 2019-12-07 ENCOUNTER — Other Ambulatory Visit (INDEPENDENT_AMBULATORY_CARE_PROVIDER_SITE_OTHER): Payer: Medicare Other

## 2019-12-07 ENCOUNTER — Encounter: Payer: Self-pay | Admitting: Family Medicine

## 2019-12-07 ENCOUNTER — Other Ambulatory Visit: Payer: Self-pay

## 2019-12-07 ENCOUNTER — Other Ambulatory Visit: Payer: Self-pay | Admitting: Family Medicine

## 2019-12-07 DIAGNOSIS — I700 Atherosclerosis of aorta: Secondary | ICD-10-CM

## 2019-12-07 LAB — CBC WITH DIFFERENTIAL/PLATELET
Basophils Absolute: 0 10*3/uL (ref 0.0–0.1)
Basophils Relative: 0.5 % (ref 0.0–3.0)
Eosinophils Absolute: 0.2 10*3/uL (ref 0.0–0.7)
Eosinophils Relative: 2.4 % (ref 0.0–5.0)
HCT: 41.5 % (ref 36.0–46.0)
Hemoglobin: 13.7 g/dL (ref 12.0–15.0)
Lymphocytes Relative: 27.7 % (ref 12.0–46.0)
Lymphs Abs: 2.2 10*3/uL (ref 0.7–4.0)
MCHC: 33.1 g/dL (ref 30.0–36.0)
MCV: 90.4 fl (ref 78.0–100.0)
Monocytes Absolute: 0.8 10*3/uL (ref 0.1–1.0)
Monocytes Relative: 10.3 % (ref 3.0–12.0)
Neutro Abs: 4.7 10*3/uL (ref 1.4–7.7)
Neutrophils Relative %: 59.1 % (ref 43.0–77.0)
Platelets: 332 10*3/uL (ref 150.0–400.0)
RBC: 4.59 Mil/uL (ref 3.87–5.11)
RDW: 14 % (ref 11.5–15.5)
WBC: 8 10*3/uL (ref 4.0–10.5)

## 2019-12-07 LAB — COMPREHENSIVE METABOLIC PANEL
ALT: 7 U/L (ref 0–35)
AST: 16 U/L (ref 0–37)
Albumin: 3.9 g/dL (ref 3.5–5.2)
Alkaline Phosphatase: 81 U/L (ref 39–117)
BUN: 25 mg/dL — ABNORMAL HIGH (ref 6–23)
CO2: 29 mEq/L (ref 19–32)
Calcium: 9 mg/dL (ref 8.4–10.5)
Chloride: 104 mEq/L (ref 96–112)
Creatinine, Ser: 0.93 mg/dL (ref 0.40–1.20)
GFR: 60.4 mL/min (ref >60.00)
Glucose, Bld: 100 mg/dL — ABNORMAL HIGH (ref 70–99)
Potassium: 4.2 mEq/L (ref 3.5–5.1)
Sodium: 139 mEq/L (ref 135–145)
Total Bilirubin: 0.6 mg/dL (ref 0.2–1.2)
Total Protein: 6.7 g/dL (ref 6.0–8.3)

## 2019-12-07 LAB — LIPID PANEL
Cholesterol: 149 mg/dL (ref 0–200)
HDL: 41.3 mg/dL (ref >39.00)
LDL Cholesterol: 88 mg/dL (ref 0–99)
NonHDL: 107.56
Total CHOL/HDL Ratio: 4
Triglycerides: 97 mg/dL (ref 0.0–149.0)
VLDL: 19.4 mg/dL (ref 0.0–40.0)

## 2019-12-09 NOTE — Telephone Encounter (Signed)
Left message for Wendy Pruitt to give [place] a call back or to fax [place] back decision from the insurance on this medication

## 2019-12-11 ENCOUNTER — Encounter: Payer: Self-pay | Admitting: Family Medicine

## 2019-12-11 ENCOUNTER — Ambulatory Visit (INDEPENDENT_AMBULATORY_CARE_PROVIDER_SITE_OTHER): Payer: Medicare Other | Admitting: Family Medicine

## 2019-12-11 ENCOUNTER — Other Ambulatory Visit: Payer: Self-pay

## 2019-12-11 VITALS — BP 112/60 | HR 78 | Temp 97.5°F | Ht 68.0 in | Wt 161.1 lb

## 2019-12-11 DIAGNOSIS — I82409 Acute embolism and thrombosis of unspecified deep veins of unspecified lower extremity: Secondary | ICD-10-CM

## 2019-12-11 DIAGNOSIS — E785 Hyperlipidemia, unspecified: Secondary | ICD-10-CM | POA: Diagnosis not present

## 2019-12-11 DIAGNOSIS — Z7189 Other specified counseling: Secondary | ICD-10-CM

## 2019-12-11 DIAGNOSIS — Z0000 Encounter for general adult medical examination without abnormal findings: Secondary | ICD-10-CM | POA: Diagnosis not present

## 2019-12-11 DIAGNOSIS — G4700 Insomnia, unspecified: Secondary | ICD-10-CM

## 2019-12-11 NOTE — Patient Instructions (Signed)
Don't change your meds for now but ask cardiology about starting crestor.   Update me as needed.  Take care.  Glad to see you.

## 2019-12-11 NOTE — Progress Notes (Signed)
This visit occurred during the SARS-CoV-2 public health emergency.  Safety protocols were in place, including screening questions prior to the visit, additional usage of staff PPE, and extensive cleaning of exam room while observing appropriate contact time as indicated for disinfecting solutions.   I have personally reviewed the Medicare Annual Wellness questionnaire and have noted 1. The patient's medical and social history 2. Their use of alcohol, tobacco or illicit drugs 3. Their current medications and supplements 4. The patient's functional ability including ADL's, fall risks, home safety risks and hearing or visual             impairment. 5. Diet and physical activities 6. Evidence for depression or mood disorders  The patients weight, height, BMI have been recorded in the chart and visual acuity is per eye clinic.  I have made referrals, counseling and provided education to the patient based review of the above and I have provided the pt with a written personalized care plan for preventive services.  Provider list updated- see scanned forms.  Routine anticipatory guidance given to patient.  See health maintenance. The possibility exists that previously documented standard health maintenance information may have been brought forward from a previous encounter into this note.  If needed, that same information has been updated to reflect the current situation based on today's encounter.    Flu 2020 Shingles discussed with patient.  Encouraged. PNA discussed with patient.  Encouraged.  Declined Tetanus.  1999.  Discussed with patient. Covid vaccine discussed with patient. Colon cancer screening done with Cologuard 2020. Breast cancer screening 2020 Bone density test discussed with patient.  Encouraged. Pap smear/pelvic exam per gynecology.  I will defer.  She agrees. Advance directive. Would have her daughter Joya Salm if patient were incapacitated.  Cognitive function addressed-  see scanned forms- and if abnormal then additional documentation follows.   Passed whisper test for hearing B.  Vision 20/30 -1 in each eye, equal.  Prev. EKG in chart.   Smoking d/w pt.  She had prev cut back and nearly quit, up to 5 cigs per day.  She is working on cutting back more.  Encouraged cessation.   She isn't having pain from prev DVT.  She is walking w/o pain.  She is going to f/u with hematology.  She is tolerating xarelto. No bleeding.    Insomnia.  Her lunesta got approved and she was able to continue with med.  D/w pt.    Elevated Cholesterol: She hasn't tried crestor yet.  No ADE on med- she hasn't started med yet. Coronary calcium score of 0.  Mild to moderate atherosclerosis in the descending aorta.  Discussed rationale for trying statin.  We talked about her ASCVD score today and she'll talk to cardiology.    She was really complementary of the care she had received both here and through outside clinics regarding her DVT.  She understands that I am usually out of clinic on Wednesdays and that she has incidentally had office visit that happened to occur on Wednesdays.  She appreciated the other staff here in the care they provided.  I am also grateful.  PMH and SH reviewed  Meds, vitals, and allergies reviewed.   ROS: Per HPI.  Unless specifically indicated otherwise in HPI, the patient denies:  General: fever. Eyes: acute vision changes ENT: sore throat Cardiovascular: chest pain Respiratory: SOB GI: vomiting GU: dysuria Musculoskeletal: acute back pain Derm: acute rash Neuro: acute motor dysfunction Psych: worsening mood Endocrine: polydipsia Heme:  bleeding Allergy: hayfever  GEN: nad, alert and oriented HEENT: ncat NECK: supple w/o LA CV: rrr. PULM: ctab, no inc wob ABD: soft, +bs EXT: no edema SKIN: no acute rash  The 10-year ASCVD risk score Mikey Bussing DC Jr., et al., 2013) is: 7.6%   Values used to calculate the score:     Age: 64 years     Sex:  Female     Is Non-Hispanic African American: No     Diabetic: No     Tobacco smoker: Yes     Systolic Blood Pressure: [id number] mmHg     Is BP treated: No     HDL Cholesterol: 41.3 mg/dL     Total Cholesterol: 149 mg/dL

## 2019-12-14 NOTE — Assessment & Plan Note (Signed)
Advance directive. Would have her daughter Kim designated if patient were incapacitated.  

## 2019-12-14 NOTE — Assessment & Plan Note (Signed)
Her lunesta got approved and she was able to continue with med.  D/w pt. continue as is.  She agrees.

## 2019-12-14 NOTE — Assessment & Plan Note (Signed)
She hasn't tried crestor yet.  No ADE on med- she hasn't started med yet. Coronary calcium score of 0.  Mild to moderate atherosclerosis in the descending aorta.  Discussed rationale for trying statin.  We talked about her ASCVD score today and she'll talk to cardiology.

## 2019-12-14 NOTE — Assessment & Plan Note (Signed)
Flu 2020 Shingles discussed with patient.  Encouraged. PNA discussed with patient.  Encouraged.  Declined Tetanus.  1999.  Discussed with patient. Covid vaccine discussed with patient. Colon cancer screening done with Cologuard 2020. Breast cancer screening 2020 Bone density test discussed with patient.  Encouraged. Pap smear/pelvic exam per gynecology.  I will defer.  She agrees. Advance directive. Would have her daughter Wendy Pruitt if patient were incapacitated.  Cognitive function addressed- see scanned forms- and if abnormal then additional documentation follows.   Passed whisper test for hearing B.  Vision 20/30 -1 in each eye, equal.  Prev. EKG in chart.   Smoking d/w pt.  She had prev cut back and nearly quit, up to 5 cigs per day.  She is working on cutting back more.  Encouraged cessation.

## 2019-12-14 NOTE — Assessment & Plan Note (Signed)
She isn't having pain from prev DVT.  She is walking w/o pain.  She is going to f/u with hematology.  She is tolerating xarelto. No bleeding.   Continue as is.  I will await hematology follow-up.

## 2019-12-27 NOTE — Progress Notes (Signed)
Cardiology Office Note:   Date:  12/28/2019  NAME:  Wendy Pruitt    MRN: [id number] DOB:  [birth year]   PCP:  Tonia Ghent, MD  Cardiologist:  No primary care provider on file.   Referring MD: Tonia Ghent, MD   Chief Complaint  Patient presents with  . Chest Pain   History of Present Illness:   Wendy Pruitt is a 64 y.o. female with a hx of glaucoma, DVT who presents for follow-up of chest pain.  She underwent cardiac CTA which showed no evidence of coronary artery disease.  She did have mild to moderate atherosclerosis noted in the aorta.  Echocardiogram with normal left ventricular function and no significant valvular heart disease.  She was also found in the interim to have an occlusive DVT in the right popliteal vein. Still smoking 7 cigarettes daily.  She is doing quite well.  She does describe some itching with her Xarelto but is not wanting to switch to Eliquis.  She reports she is walking 2 miles per day without any chest pain or shortness of breath.  Overall, she is reassured that her heart scan was normal.  I also went over the results of her echocardiogram in office today.  I did review her recent lipid panel with her primary care physician.  LDL was 88.  She has not wanted take a statin at this time.  I think this is reasonable given that her coronary arteries are normal.  She does have plaque in her aorta but will work with diet and exercise.  She also will continue to work on smoking cessation.  Most recent lipid profile shows total cholesterol 149, HDL 41, LDL 88, triglycerides 97.  Past Medical History: Past Medical History:  Diagnosis Date  . Glaucoma, both eyes   . History of left breast cancer 2004--- per pt no recurrence   dx DCIS left breast s/p  total mastectomy w/ reconstruction,  NO chemo or radiation therpy  (ER and PR negative)  . History of Paget's disease of breast   . Insomnia   . PONV (postoperative nausea and vomiting)   . VIN II (vulvar  intraepithelial neoplasia II)     Past Surgical History: Past Surgical History:  Procedure Laterality Date  . CATARACT EXTRACTION W/ INTRAOCULAR LENS IMPLANT  1990 approx.   " left eye I think"  . MASTECTOMY Left   . PARTIAL MASTECTOMY INCORPATING NIPPLE AREOLAR COMPLEX Left 12-28-2002   dr young   [facility]   hx paget's disease left nipple  . RE-EXCISION  PORTION OF THE LUMPECTOMY SITE Left 01-11-2003    dr young  [facility]  . TOTAL MASTECTOMY Left 02-16-2003  dr young [facility]   w/ AXILLARY LYMPH NODE DISSECTION AND IMMEDIATE BREAST RECONSTRUCTION WITH SALINE IMPLANT  . VAGINAL HYSTERECTOMY  1983 approx.  Wendy Pruitt N/A 02/11/2018   Procedure: WIDE EXCISION VULVECTOMY;  Surgeon: Wendy Fowler, MD;  Location: [facility];  Service: Gynecology;  Laterality: N/A;    Current Medications: No outpatient medications have been marked as taking for the 12/28/19 encounter (Office Visit) with Geralynn Rile, MD.     Allergies:    Chantix [varenicline], Trazodone and nefazodone, and Wellbutrin [bupropion]   Social History: Social History   Socioeconomic History  . Marital status: Divorced    Spouse name: Not on file  . Number of children: 2  . Years of education: Not on file  . Highest education level: Not on file  Occupational History  . Occupation: Development worker, international aid: UNEMPLOYED  Tobacco Use  . Smoking status: Current Every Day Smoker    Packs/day: 0.75    Years: 30.00    Pack years: 22.50    Types: Cigarettes  . Smokeless tobacco: Never Used  Substance and Sexual Activity  . Alcohol use: Not Currently    Alcohol/week: 0.0 standard drinks    Comment: occassionally  . Drug use: No  . Sexual activity: Not on file  Other Topics Concern  . 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     Social Determinants of Health    Financial Resource Strain:   . Difficulty of Paying Living Expenses: Not on file  Food Insecurity:   . Worried About Charity fundraiser in the Last Year: Not on file  . Ran Out of Food in the Last Year: Not on file  Transportation Needs:   . Lack of Transportation (Medical): Not on file  . Lack of Transportation (Non-Medical): Not on file  Physical Activity:   . Days of Exercise per Week: Not on file  . Minutes of Exercise per Session: Not on file  Stress:   . Feeling of Stress : Not on file  Social Connections:   . Frequency of Communication with Friends and Family: Not on file  . Frequency of Social Gatherings with Friends and Family: Not on file  . Attends Religious Services: Not on file  . Active Member of Clubs or Organizations: Not on file  . Attends Archivist Meetings: Not on file  . Marital Status: Not on file     Family History: The patient's family history includes Breast cancer in her daughter and maternal grandmother; Cancer in her brother and sister; Drug abuse in her brother; Heart disease in her brother, father, and sister; Heart disease (age of onset: 38) in her mother. There is no history of Colon cancer.  ROS:   All other ROS reviewed and negative. Pertinent positives noted in the HPI.     EKGs/Labs/Other Studies Reviewed:   The following studies were personally reviewed by me today:  TTE 10/07/2019 1. Left ventricular ejection fraction, by visual estimation, is 60 to  65%. The left ventricle has normal function. There is mildly increased  left ventricular hypertrophy.  2. Global right ventricle has normal systolic function.The right  ventricular size is normal. No increase in right ventricular wall  thickness.  3. Left atrial size was normal.  4. Right atrial size was normal.  5. Mild mitral annular calcification.  6. The mitral valve is normal in structure. Mild mitral valve  regurgitation. No evidence of mitral stenosis.  7. The  tricuspid valve is normal in structure. Tricuspid valve  regurgitation is mild.  8. The aortic valve is tricuspid. Aortic valve regurgitation is not  visualized. Mild aortic valve sclerosis without stenosis.  9. The pulmonic valve was grossly normal. Pulmonic valve regurgitation is  trivial.  10. Normal pulmonary artery systolic pressure.  11. The inferior vena cava is normal in size with greater than 50%  respiratory variability, suggesting right atrial pressure of 3 mmHg.   CCTA 11/02/2019 IMPRESSION: 1. Coronary calcium score of 0.  2. Normal coronary origin with right dominance.  3. No evidence of CAD.  4. Mid LAD myocardial bridge (normal variant).  5. Mild to moderate atherosclerosis in the descending aorta.  RECOMMENDATIONS: 1. No evidence of CAD (0%). Consider non-atherosclerotic causes of chest pain.  2. Risk factor modification recommended for atherosclerotic disease in descending aorta.  Eleonore Chiquito, MD  Recent Labs: 12/07/2019: ALT 7; BUN 25; Creatinine, Ser 0.93; Hemoglobin 13.7; Platelets 332.0; Potassium 4.2; Sodium 139   Recent Lipid Panel    Component Value Date/Time   CHOL 149 12/07/2019 0807   TRIG 97.0 12/07/2019 0807   HDL 41.30 12/07/2019 0807   CHOLHDL 4 12/07/2019 0807   VLDL 19.4 12/07/2019 0807   LDLCALC 88 12/07/2019 0807    Physical Exam:   VS:  BP 119/72   Pulse 82   Temp 97.7 F (36.5 C)   Ht 5\' 8"  (1.727 m)   Wt 162 lb 3.2 oz (73.6 kg)   SpO2 96%   BMI 24.66 kg/m    Wt Readings from Last 3 Encounters:  12/28/19 162 lb 3.2 oz (73.6 kg)  12/11/19 161 lb 2 oz (73.1 kg)  10/23/19 163 lb 6.4 oz (74.1 kg)    General: Well nourished, well developed, in no acute distress Heart: Atraumatic, normal size  Eyes: PEERLA, EOMI  Neck: Supple, no JVD Endocrine: No thryomegaly Cardiac: Normal S1, S2; RRR; no murmurs, rubs, or gallops Lungs: Clear to auscultation bilaterally, no wheezing, rhonchi or rales  Abd: Soft, nontender,  no hepatomegaly  Ext: No edema, pulses 2+ Musculoskeletal: No deformities, BUE and BLE strength normal and equal Skin: Warm and dry, no rashes   Neuro: Alert and oriented to person, place, time, and situation, CNII-XII grossly intact, no focal deficits  Psych: Normal mood and affect   ASSESSMENT:   MICHAIAH MUZZI is a 64 y.o. female who presents for the following: 1. Chest pain, unspecified type   2. Aortic atherosclerosis ([place])   3. Acute deep vein thrombosis (DVT) of popliteal vein of right lower extremity (HCC)   4. Tobacco abuse     PLAN:   1. Chest pain, unspecified type -Noncardiac.  Coronary CTA normal.  2. Aortic atherosclerosis ([place]) -She does have aortic atherosclerosis on the CT chest.  Most recent LDL 88.  She is now 20 take a statin this time.  I think this is reasonable.  She will continue to work on diet and exercise.  She also work on smoking cessation.  She will touch base with [place] yearly to review her cholesterol and progress in smoking cessation.  3. Acute deep vein thrombosis (DVT) of popliteal vein of right lower extremity (HCC) -Unprovoked.  Below the knee DVT.  Pain has resolved.  Remains on Xarelto.  She will follow-up with hematology to determine if she will need extended therapy.  4. Tobacco abuse -Still smoking 7 cigarettes/day.  I encouraged her to quit.  Smoking cessation counseling provided.   Disposition: Return in about 1 year (around 12/27/2020).  Medication Adjustments/Labs and Tests Ordered: Current medicines are reviewed at length with the patient today.  Concerns regarding medicines are outlined above.  No orders of the defined types were placed in this encounter.  No orders of the defined types were placed in this encounter.   Patient Instructions  Medication Instructions:  The current medical regimen is effective;  continue present plan and medications.  *If you need a refill on your cardiac medications before your next appointment,  please call your pharmacy*  Follow-Up: At [facility], you and your health needs are our priority.  As part of our continuing mission  to provide you with exceptional heart care, we have created designated Provider Care Teams.  These Care Teams include your primary Cardiologist (physician) and Advanced Practice Providers (APPs -  Physician Assistants and Nurse Practitioners) who all work together to provide you with the care you need, when you need it.  Your next appointment:   12 month(s)  The format for your next appointment:   Either In Person or Virtual  Provider:   Eleonore Chiquito, MD       Signed, Addison Naegeli. Audie Box, [place]  [address], [place], [place] [address] [phone]  12/28/2019 8:28 AM

## 2019-12-28 ENCOUNTER — Other Ambulatory Visit: Payer: Self-pay

## 2019-12-28 ENCOUNTER — Encounter: Payer: Self-pay | Admitting: Cardiovascular Disease

## 2019-12-28 ENCOUNTER — Ambulatory Visit: Payer: Medicare Other | Admitting: Cardiovascular Disease

## 2019-12-28 VITALS — BP 119/72 | HR 82 | Temp 97.7°F | Ht 68.0 in | Wt 162.2 lb

## 2019-12-28 DIAGNOSIS — I700 Atherosclerosis of aorta: Secondary | ICD-10-CM | POA: Diagnosis not present

## 2019-12-28 DIAGNOSIS — R079 Chest pain, unspecified: Secondary | ICD-10-CM | POA: Diagnosis not present

## 2019-12-28 DIAGNOSIS — Z720 Tobacco use: Secondary | ICD-10-CM

## 2019-12-28 DIAGNOSIS — I82431 Acute embolism and thrombosis of right popliteal vein: Secondary | ICD-10-CM

## 2019-12-28 NOTE — Patient Instructions (Signed)
Medication Instructions:  The current medical regimen is effective;  continue present plan and medications.  *If you need a refill on your cardiac medications before your next appointment, please call your pharmacy*  Follow-Up: At [facility], you and your health needs are our priority.  As part of our continuing mission to provide you with exceptional heart care, we have created designated Provider Care Teams.  These Care Teams include your primary Cardiologist (physician) and Advanced Practice Providers (APPs -  Physician Assistants and Nurse Practitioners) who all work together to provide you with the care you need, when you need it.  Your next appointment:   12 month(s)  The format for your next appointment:   Either In Person or Virtual  Provider:   Eleonore Chiquito, MD

## 2020-02-16 ENCOUNTER — Other Ambulatory Visit: Payer: Self-pay

## 2020-02-16 ENCOUNTER — Ambulatory Visit: Payer: Medicare Other | Admitting: Family Medicine

## 2020-02-16 ENCOUNTER — Encounter: Payer: Self-pay | Admitting: Family Medicine

## 2020-02-16 DIAGNOSIS — M79606 Pain in leg, unspecified: Secondary | ICD-10-CM

## 2020-02-16 NOTE — Progress Notes (Signed)
This visit occurred during the SARS-CoV-2 public health emergency.  Safety protocols were in place, including screening questions prior to the visit, additional usage of staff PPE, and extensive cleaning of exam room while observing appropriate contact time as indicated for disinfecting solutions.  L leg pain.  H/o DVT in R calf, not on the left.  Still on xarelto.  She started with anterior L knee pain about 10 days ago.  It wasn't getting better.  No warmth.  No popping, clicking, grinding, locking.  No bruising.  No trauma.    She is off statin at this point, had talked with cardiology prev.  Smoking cessation d/w pt, encouraged.    She walked 1 mile yesterday w/o limp or pain.   Meds, vitals, and allergies reviewed.   ROS: Per HPI unless specifically indicated in ROS section   nad ncat rrr ctab abd soft, normal BS Normal L knee exam.  She does not have crepitus noted on exam.  Medial lateral joint line not tender.  Patella not tender to palpation.  No bruising or swelling.  Able to bear weight.  Calf not tender.  No distal lower extremity swelling.  Recheck pulse 90.

## 2020-02-16 NOTE — Patient Instructions (Addendum)
I would try icing for 5 minutes at a time.  Update me as needed.   Take care.  Glad to see you.

## 2020-02-17 DIAGNOSIS — M79606 Pain in leg, unspecified: Secondary | ICD-10-CM | POA: Insufficient documentation

## 2020-02-17 NOTE — Assessment & Plan Note (Signed)
She does not have significant findings right now and she is already anticoagulated so the risk of her having a DVT at this point would be exceedingly low.  Discussed with patient.  She was reassured.  She can ice as needed for knee pain and then update me as needed.  She agrees.

## 2020-04-22 ENCOUNTER — Ambulatory Visit: Payer: Medicare Other | Admitting: Oncology

## 2020-04-22 ENCOUNTER — Other Ambulatory Visit: Payer: Medicare Other

## 2020-04-29 ENCOUNTER — Other Ambulatory Visit: Payer: Self-pay

## 2020-04-29 ENCOUNTER — Inpatient Hospital Stay: Payer: Medicare Other | Attending: Oncology | Admitting: *Deleted

## 2020-04-29 ENCOUNTER — Encounter: Payer: Self-pay | Admitting: Oncology

## 2020-04-29 ENCOUNTER — Inpatient Hospital Stay: Payer: Medicare Other | Admitting: Oncology

## 2020-04-29 VITALS — BP 117/51 | HR 71 | Temp 97.1°F | Resp 16 | Wt 167.1 lb

## 2020-04-29 DIAGNOSIS — Z7901 Long term (current) use of anticoagulants: Secondary | ICD-10-CM | POA: Diagnosis not present

## 2020-04-29 DIAGNOSIS — F17210 Nicotine dependence, cigarettes, uncomplicated: Secondary | ICD-10-CM | POA: Insufficient documentation

## 2020-04-29 DIAGNOSIS — Z86718 Personal history of other venous thrombosis and embolism: Secondary | ICD-10-CM | POA: Insufficient documentation

## 2020-04-29 DIAGNOSIS — I824Y1 Acute embolism and thrombosis of unspecified deep veins of right proximal lower extremity: Secondary | ICD-10-CM

## 2020-04-29 LAB — FIBRIN DERIVATIVES D-DIMER (ARMC ONLY): Fibrin derivatives D-dimer (ARMC): 245.05 ng/mL (FEU) (ref 0.00–499.00)

## 2020-04-29 NOTE — Progress Notes (Signed)
Called pt and let her know that the d dimer test was normal and pt after she finishes xarelto for end of June she can stop taking it. The patient got  a 3 month supply and wanted to know if she should take thepills for July and Dr. Janese Banks says it is not necessary but if pt wants to she can . Patient will stop at end of June .

## 2020-04-29 NOTE — Progress Notes (Signed)
Pt ahs no sx of dvt. She does say that after she walks sometimes she feels a little sore but no redness, no swelling or pain except the note above at time. Pt is not sure if she will need to cont. xarelto but sheis going in donut hole and she will have to pay oop for her meds starting for September to end of year and will need help with meds

## 2020-05-01 ENCOUNTER — Encounter: Payer: Self-pay | Admitting: Oncology

## 2020-05-01 NOTE — Progress Notes (Signed)
Hematology/Oncology Consult note [facility]  Telephone:([phone] [phone]  Patient Care Team: Tonia Ghent, MD as PCP - General (Family Medicine)   Name of the patient: Wendy Pruitt  [id number]  1956/01/14   Date of visit: 05/01/20  Diagnosis-DVT in December 2020 right proximal lower extremity  Chief complaint/ Reason for visit-routine follow-up of DVT  Heme/Onc history: Patient is a 64 yr old female with no significant medical problems. She had sudden onset pain in her right calf about 2 weeks ago. After pain did not subside she went to urgent care and had USG doppler which showed occlusive DVT in the right poplitea, peroneal and tibial veins. She has not had any prior DVT. She does not use HRT. No significant pregnancy complications or family h/o DVT. She is currently on xarelto and tolerating it well without significant side effects.  Patient did not wish to undergo genetic hypercoagulable work-up.  Testing for antiphospholipid antibody syndrome was negative.  Interval history-patient is tolerating Xarelto well and will complete 6 months of anticoagulation end of this month.  She has not had any right lower extremity swelling.  Occasional pain in her leg.  ECOG PS- 0 Pain scale- 0  Review of systems- Review of Systems  Constitutional: Negative for chills, fever, malaise/fatigue and weight loss.  HENT: Negative for congestion, ear discharge and nosebleeds.   Eyes: Negative for blurred vision.  Respiratory: Negative for cough, hemoptysis, sputum production, shortness of breath and wheezing.   Cardiovascular: Negative for chest pain, palpitations, orthopnea and claudication.  Gastrointestinal: Negative for abdominal pain, blood in stool, constipation, diarrhea, heartburn, melena, nausea and vomiting.  Genitourinary: Negative for dysuria, flank pain, frequency, hematuria and urgency.  Musculoskeletal: Negative for back pain, joint pain and  myalgias.  Skin: Negative for rash.  Neurological: Negative for dizziness, tingling, focal weakness, seizures, weakness and headaches.  Endo/Heme/Allergies: Does not bruise/bleed easily.  Psychiatric/Behavioral: Negative for depression and suicidal ideas. The patient does not have insomnia.       Allergies  Allergen Reactions  . Chantix [Varenicline] Other (See Comments)    Intolerant- abnormal dreams.    . Trazodone And Nefazodone Other (See Comments)    Nasal congestion  . Wellbutrin [Bupropion] Other (See Comments)    Intolerant.       Past Medical History:  Diagnosis Date  . Glaucoma, both eyes   . History of left breast cancer 2004--- per pt no recurrence   dx DCIS left breast s/p  total mastectomy w/ reconstruction,  NO chemo or radiation therpy  (ER and PR negative)  . History of Paget's disease of breast   . Insomnia   . PONV (postoperative nausea and vomiting)   . VIN II (vulvar intraepithelial neoplasia II)      Past Surgical History:  Procedure Laterality Date  . CATARACT EXTRACTION W/ INTRAOCULAR LENS IMPLANT  1990 approx.   " left eye I think"  . MASTECTOMY Left   . PARTIAL MASTECTOMY INCORPATING NIPPLE AREOLAR COMPLEX Left 12-28-2002   dr young   [facility]   hx paget's disease left nipple  . RE-EXCISION  PORTION OF THE LUMPECTOMY SITE Left 01-11-2003    dr young  [facility]  . TOTAL MASTECTOMY Left 02-16-2003  dr young [facility]   w/ AXILLARY LYMPH NODE DISSECTION AND IMMEDIATE BREAST RECONSTRUCTION WITH SALINE IMPLANT  . VAGINAL HYSTERECTOMY  1983 approx.  Eugenie Norrie N/A 02/11/2018   Procedure: WIDE EXCISION VULVECTOMY;  Surgeon: Cheri Fowler, MD;  Location:  [place];  Service: Gynecology;  Laterality: N/A;    Social History   Socioeconomic History  . Marital status: Divorced    Spouse name: Not on file  . Number of children: 2  . Years of education: Not on file  . Highest education level: Not on file  Occupational History  . Occupation:  Development worker, international aid: UNEMPLOYED  Tobacco Use  . Smoking status: Current Every Day Smoker    Packs/day: 0.50    Years: 30.00    Pack years: 15.00    Types: Cigarettes  . Smokeless tobacco: Never Used  Vaping Use  . Vaping Use: Never used  Substance and Sexual Activity  . Alcohol use: Not Currently    Alcohol/week: 0.0 standard drinks    Comment: occassionally  . Drug use: No  . Sexual activity: Not on file  Other Topics Concern  . 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     Social Determinants of Health   Financial Resource Strain:   . Difficulty of Paying Living Expenses:   Food Insecurity:   . Worried About Charity fundraiser in the Last Year:   . Arboriculturist in the Last Year:   Transportation Needs:   . Film/video editor (Medical):   Marland Kitchen Lack of Transportation (Non-Medical):   Physical Activity:   . Days of Exercise per Week:   . Minutes of Exercise per Session:   Stress:   . Feeling of Stress :   Social Connections:   . Frequency of Communication with Friends and Family:   . Frequency of Social Gatherings with Friends and Family:   . Attends Religious Services:   . Active Member of Clubs or Organizations:   . Attends Archivist Meetings:   Marland Kitchen Marital Status:   Intimate Partner Violence:   . Fear of Current or Ex-Partner:   . Emotionally Abused:   Marland Kitchen Physically Abused:   . Sexually Abused:     Family History  Problem Relation Age of Onset  . Heart disease Mother 62       CHF  . Heart disease Father        MI  . Drug abuse Brother        In [place]; (drugs)  . Heart disease Brother        CAD (valve surg)  . Cancer Sister        lung cancer  . Cancer Brother   . Heart disease Sister        heart failure  . Breast cancer Daughter   . Breast cancer Maternal Grandmother   . Colon cancer Neg  Hx      Current Outpatient Medications:  .  dorzolamide-timolol (COSOPT) 22.3-6.8 MG/ML ophthalmic solution, Place 1 drop into the right eye 2 (two) times daily., Disp: , Rfl:  .  eszopiclone (LUNESTA) 2 MG TABS tablet, TAKE 1/2 TO 1 TABLET BY MOUTH AT BEDTIME USE SPARINGLY AS NEEDED. INTOLERANT OF TRAZODONE., Disp: 30 tablet, Rfl: 5 .  latanoprost (XALATAN) 0.005 % ophthalmic solution, Place 1 drop into both eyes at bedtime. , Disp: , Rfl:  .  pimecrolimus (ELIDEL) 1 % cream, Apply 1 application topically 2 (two) times daily., Disp: , Rfl:  .  rivaroxaban (XARELTO) 20 MG TABS tablet, Take 1  tablet (20 mg total) by mouth daily with supper. Start only after finishing 15mg  starter pack, Disp: 30 tablet, Rfl: 5  Physical exam:  Vitals:   04/29/20 1140  BP: (!) 117/51  Pulse: 71  Resp: 16  Temp: (!) 97.1 F (36.2 C)  TempSrc: Tympanic  SpO2: 100%  Weight: 167 lb 1.6 oz (75.8 kg)   Physical Exam Constitutional:      General: She is not in acute distress. Cardiovascular:     Rate and Rhythm: Normal rate and regular rhythm.     Heart sounds: Normal heart sounds.  Pulmonary:     Effort: Pulmonary effort is normal.     Breath sounds: Normal breath sounds.  Musculoskeletal:     Right lower leg: No edema (no hyperpigmentation or swelling noted).     Left lower leg: No edema.  Skin:    General: Skin is warm and dry.  Neurological:     Mental Status: She is alert and oriented to person, place, and time.      CMP Latest Ref Rng & Units 12/07/2019  Glucose 70 - 99 mg/dL 100(H)  BUN 6 - 23 mg/dL 25(H)  Creatinine 0.40 - 1.20 mg/dL 0.93  Sodium 135 - 145 mEq/L 139  Potassium 3.5 - 5.1 mEq/L 4.2  Chloride 96 - 112 mEq/L 104  CO2 19 - 32 mEq/L 29  Calcium 8.4 - 10.5 mg/dL 9.0  Total Protein 6.0 - 8.3 g/dL 6.7  Total Bilirubin 0.2 - 1.2 mg/dL 0.6  Alkaline Phos 39 - 117 U/L 81  AST 0 - 37 U/L 16  ALT 0 - 35 U/L 7   CBC Latest Ref Rng & Units 12/07/2019  WBC 4.0 - 10.5 K/uL 8.0    Hemoglobin 12.0 - 15.0 g/dL 13.7  Hematocrit 36 - 46 % 41.5  Platelets 150 - 400 K/uL 332.0      Assessment and plan- Patient is a 64 y.o. female with history of right proximal lower extremity DVT in December 2020 currently on Xarelto here for routine follow-up  Patient will be completing 6 months of anticoagulation end of this month. Her calculated HERDOO2 score is 1 (1 point for her age. D dimer is <250. No evidence of post thrombotic syndrome in RLE).   I therefore do not feel she requires extended repeat of anticoagulation at this time and can come off Xarelto at the end of this month.  If patient has any recurrent DVT/PE in the future she will need indefinite anticoagulation.  I will see her once in 6 months time and at that point if she is doing well she can continue to follow-up with Dr. Damita Dunnings   Visit Diagnosis 1. History of DVT (deep vein thrombosis)      Dr. Randa Evens, MD, MPH [facility] [phone] 05/01/2020 11:08 AM

## 2020-05-02 ENCOUNTER — Telehealth: Payer: Self-pay

## 2020-05-02 NOTE — Telephone Encounter (Signed)
LMOM letting pt know she can stop xarelto the end of June per Dr.Rao,left call back number if pt has any question. SJC

## 2020-05-02 NOTE — Telephone Encounter (Signed)
-----   Message from Sindy Guadeloupe, MD sent at 04/29/2020 12:31 PM EDT ----- Patient can stop her anticoagulation xarelto by end of june

## 2020-05-11 ENCOUNTER — Other Ambulatory Visit: Payer: Self-pay | Admitting: Family Medicine

## 2020-05-11 NOTE — Telephone Encounter (Signed)
Refill request Lunesta Last office visit 02/16/20 Last refill 11/13/19 #30/5

## 2020-05-11 NOTE — Telephone Encounter (Signed)
Pt wants you to know she is stopping this medication per Dr. Janese Banks.

## 2020-05-12 NOTE — Telephone Encounter (Signed)
Sent. Thanks.   

## 2020-05-12 NOTE — Telephone Encounter (Signed)
Noted. Thanks.  Appreciate the help of all involved. °

## 2020-05-13 ENCOUNTER — Ambulatory Visit: Payer: Medicare Other

## 2020-05-27 ENCOUNTER — Other Ambulatory Visit: Payer: Self-pay | Admitting: Obstetrics and Gynecology

## 2020-05-27 DIAGNOSIS — Z1231 Encounter for screening mammogram for malignant neoplasm of breast: Secondary | ICD-10-CM

## 2020-06-06 ENCOUNTER — Other Ambulatory Visit: Payer: Self-pay

## 2020-06-06 ENCOUNTER — Encounter: Payer: Self-pay | Admitting: Dermatology

## 2020-06-06 ENCOUNTER — Ambulatory Visit: Payer: Medicare Other | Admitting: Dermatology

## 2020-06-06 DIAGNOSIS — D229 Melanocytic nevi, unspecified: Secondary | ICD-10-CM

## 2020-06-06 DIAGNOSIS — L814 Other melanin hyperpigmentation: Secondary | ICD-10-CM

## 2020-06-06 DIAGNOSIS — Z853 Personal history of malignant neoplasm of breast: Secondary | ICD-10-CM | POA: Diagnosis not present

## 2020-06-06 DIAGNOSIS — D1800 Hemangioma unspecified site: Secondary | ICD-10-CM

## 2020-06-06 DIAGNOSIS — L578 Other skin changes due to chronic exposure to nonionizing radiation: Secondary | ICD-10-CM

## 2020-06-06 DIAGNOSIS — L820 Inflamed seborrheic keratosis: Secondary | ICD-10-CM

## 2020-06-06 DIAGNOSIS — H01111 Allergic dermatitis of right upper eyelid: Secondary | ICD-10-CM | POA: Diagnosis not present

## 2020-06-06 DIAGNOSIS — Z1283 Encounter for screening for malignant neoplasm of skin: Secondary | ICD-10-CM

## 2020-06-06 DIAGNOSIS — H01119 Allergic dermatitis of unspecified eye, unspecified eyelid: Secondary | ICD-10-CM

## 2020-06-06 DIAGNOSIS — L821 Other seborrheic keratosis: Secondary | ICD-10-CM

## 2020-06-06 MED ORDER — PIMECROLIMUS 1 % EX CREA: 1.0000 "application " | TOPICAL_CREAM | Freq: Two times a day (BID) | CUTANEOUS | 2 refills | Status: DC

## 2020-06-06 NOTE — Progress Notes (Signed)
   Follow-Up Visit   Subjective  Wendy Pruitt is a 64 y.o. female who presents for the following: TBSE. The patient presents for Total-Body Skin Exam (TBSE) for skin cancer screening and mole check. Patient presents today for annual TBSE, has an area of concern on her right upper eyelid, and would like a refill of Elidel cream for her eyelids. Patient has h/o R breast cancer  The following portions of the chart were reviewed this encounter and updated as appropriate:  Tobacco  Allergies  Meds  Problems  Med Hx  Surg Hx  Fam Hx     Review of Systems:  No other skin or systemic complaints except as noted in HPI or Assessment and Plan.  Objective  Well appearing patient in no apparent distress; mood and affect are within normal limits.  A full examination was performed including scalp, head, eyes, ears, nose, lips, neck, chest, axillae, abdomen, back, buttocks, bilateral upper extremities, bilateral lower extremities, hands, feet, fingers, toes, fingernails, and toenails. All findings within normal limits unless otherwise noted below.  Objective  Right Breast: No lymphadenopathy  Objective  Right Upper Eyelid: Erythematous keratotic or waxy stuck-on papule or plaque.   Objective  Right Upper eyelid: Clear today   Assessment & Plan  History of right breast cancer Right Breast  Clear. Observe for recurrence. Call clinic for new or changing lesions.  Recommend regular skin exams, daily broad-spectrum spf 30+ sunscreen use, and photoprotection.     Inflamed seborrheic keratosis Right Upper Eyelid  Cryotherapy today Prior to procedure, discussed risks of blister formation, small wound, skin dyspigmentation, or rare scar following cryotherapy.    Destruction of lesion - Right Upper Eyelid Complexity: simple   Destruction method: cryotherapy   Informed consent: discussed and consent obtained   Timeout:  patient name, date of birth, surgical site, and procedure  verified Lesion destroyed using liquid nitrogen: Yes   Region frozen until ice ball extended beyond lesion: Yes   Outcome: patient tolerated procedure well with no complications   Post-procedure details: wound care instructions given    Eyelid dermatitis, allergic/contact Right Upper eyelid  Continue Elidel  Reordered Medications pimecrolimus (ELIDEL) 1 % cream    Lentigines - Scattered tan macules - Discussed due to sun exposure - Benign, observe - Call for any changes  Seborrheic Keratoses - Stuck-on, waxy, tan-brown papules and plaques  - Discussed benign etiology and prognosis. - Observe - Call for any changes  Melanocytic Nevi - Tan-brown and/or pink-flesh-colored symmetric macules and papules - Benign appearing on exam today - Observation - Call clinic for new or changing moles - Recommend daily use of broad spectrum spf 30+ sunscreen to sun-exposed areas.   Hemangiomas - Red papules - Discussed benign nature - Observe - Call for any changes  Actinic Damage - diffuse scaly erythematous macules with underlying dyspigmentation - Recommend daily broad spectrum sunscreen SPF 30+ to sun-exposed areas, reapply every 2 hours as needed.  - Call for new or changing lesions.  Skin cancer screening performed today.  Return in about 1 year (around 06/06/2021) for TBSE.  I, Donzetta Kohut, CMA, am acting as scribe for Sarina Ser, MD . Documentation: I have reviewed the above documentation for accuracy and completeness, and I agree with the above.  Sarina Ser, MD

## 2020-06-06 NOTE — Patient Instructions (Addendum)
Recommend daily broad spectrum sunscreen SPF 30+ to sun-exposed areas, reapply every 2 hours as needed. Call for new or changing lesions.  Seborrheic Keratosis  What causes seborrheic keratoses? Seborrheic keratoses are harmless, common skin growths that first appear during adult life.  As time goes by, more growths appear.  Some people may develop a large number of them.  Seborrheic keratoses appear on both covered and uncovered body parts.  They are not caused by sunlight.  The tendency to develop seborrheic keratoses can be inherited.  They vary in color from skin-colored to gray, brown, or even black.  They can be either smooth or have a rough, warty surface.   Seborrheic keratoses are superficial and look as if they were stuck on the skin.  Under the microscope this type of keratosis looks like layers upon layers of skin.  That is why at times the top layer may seem to fall off, but the rest of the growth remains and re-grows.    Treatment Seborrheic keratoses do not need to be treated, but can easily be removed in the office.  Seborrheic keratoses often cause symptoms when they rub on clothing or jewelry.  Lesions can be in the way of shaving.  If they become inflamed, they can cause itching, soreness, or burning.  Removal of a seborrheic keratosis can be accomplished by freezing, burning, or surgery. If any spot bleeds, scabs, or grows rapidly, please return to have it checked, as these can be an indication of a skin cancer.  Prior to procedure, discussed risks of blister formation, small wound, skin dyspigmentation, or rare scar following cryotherapy.  Liquid nitrogen was applied for 10-12 seconds to the skin lesion and the expected blistering or scabbing reaction explained. Do not pick at the area. Patient reminded to expect hypopigmented scars from the procedure. Return if lesion fails to fully resolve.  Cryotherapy Aftercare  . Wash gently with soap and water everyday.   . Apply Vaseline  and Band-Aid daily until healed.    

## 2020-06-07 ENCOUNTER — Encounter: Payer: Self-pay | Admitting: Dermatology

## 2020-07-18 ENCOUNTER — Other Ambulatory Visit: Payer: Self-pay

## 2020-07-18 ENCOUNTER — Ambulatory Visit
Admission: RE | Admit: 2020-07-18 | Discharge: 2020-07-18 | Disposition: A | Discharge status: Home / Self Care | Payer: Medicare Other | Source: Ambulatory Visit | Attending: Obstetrics and Gynecology | Admitting: Obstetrics and Gynecology

## 2020-07-18 DIAGNOSIS — Z1231 Encounter for screening mammogram for malignant neoplasm of breast: Secondary | ICD-10-CM

## 2020-10-10 ENCOUNTER — Telehealth: Payer: Self-pay

## 2020-10-10 NOTE — Telephone Encounter (Signed)
Called and left voicemail for patient stating that we received her letter regarding her Eszopiclone 2 mg denied drug coverage from [employer]. Informed patient that unfortunately we cannot address this issue until January 2022.

## 2020-10-17 ENCOUNTER — Other Ambulatory Visit: Payer: Self-pay

## 2020-10-17 ENCOUNTER — Ambulatory Visit: Payer: Medicare Other | Admitting: Dermatology

## 2020-10-17 DIAGNOSIS — L820 Inflamed seborrheic keratosis: Secondary | ICD-10-CM | POA: Diagnosis not present

## 2020-10-17 DIAGNOSIS — L821 Other seborrheic keratosis: Secondary | ICD-10-CM | POA: Diagnosis not present

## 2020-10-17 DIAGNOSIS — L720 Epidermal cyst: Secondary | ICD-10-CM

## 2020-10-17 DIAGNOSIS — L308 Other specified dermatitis: Secondary | ICD-10-CM | POA: Diagnosis not present

## 2020-10-17 NOTE — Progress Notes (Signed)
   Follow-Up Visit   Subjective  Wendy Pruitt is a 64 y.o. female who presents for the following: Skin Tag (Patient states she has several small bumps on side of right eyelid that she would like to have looked at. States the cream prescribed at last visit did not help. Patient also states she noticed an area on her right eyelid. She is not sure if it is a skin tag but bothers her sometimes and itches. ).  The following portions of the chart were reviewed this encounter and updated as appropriate:  Tobacco  Allergies  Meds  Problems  Med Hx  Surg Hx  Fam Hx      Objective  Well appearing patient in no apparent distress; mood and affect are within normal limits.  A focused examination was performed including right eyelid. Relevant physical exam findings are noted in the Assessment and Plan.  Objective  Right Eyelid: Pretty clear   Objective  right upper eyelid: Erythematous keratotic or waxy stuck-on papule or plaque.   Objective  right lateral canthus (2): Smooth white papule(s).   Assessment & Plan  Eczema/eyelid dermatitis -chronic persistent and recurrent Right Eyelid Continue elidel as prescribed  - eczema controlled   Inflamed seborrheic keratosis right upper eyelid Destruction of lesion - right upper eyelid Complexity: simple   Destruction method: cryotherapy   Informed consent: discussed and consent obtained   Timeout:  patient name, date of birth, surgical site, and procedure verified Lesion destroyed using liquid nitrogen: Yes   Region frozen until ice ball extended beyond lesion: Yes   Outcome: patient tolerated procedure well with no complications   Post-procedure details: wound care instructions given     Seborrheic Keratoses - Stuck-on, waxy, tan-brown papules and plaques  - Discussed benign etiology and prognosis. - Observe - Call for any changes  Milia - tiny firm white papules - type of cyst - benign - may be extracted if symptomatic -  observe - if persistent on fu, will consider extraction  Return in about 2 months (around 12/18/2020) for Follow up [place] and [facility].   I, Ruthell Rummage, CMA, am acting as scribe for Sarina Ser, MD.  Documentation: I have reviewed the above documentation for accuracy and completeness, and I agree with the above.  Sarina Ser, MD

## 2020-10-17 NOTE — Patient Instructions (Signed)
Cryotherapy Aftercare  . Wash gently with soap and water everyday.   . Apply Vaseline and Band-Aid daily until healed.  

## 2020-10-20 ENCOUNTER — Encounter: Payer: Self-pay | Admitting: Dermatology

## 2020-10-31 ENCOUNTER — Other Ambulatory Visit: Payer: Self-pay | Admitting: Family Medicine

## 2020-11-01 ENCOUNTER — Ambulatory Visit: Payer: Medicare Other | Admitting: Oncology

## 2020-11-07 ENCOUNTER — Inpatient Hospital Stay: Payer: Medicare Other | Attending: Oncology | Admitting: Oncology

## 2020-11-07 ENCOUNTER — Encounter: Payer: Self-pay | Admitting: Oncology

## 2020-11-07 VITALS — BP 121/76 | HR 82 | Temp 98.9°F | Wt 159.9 lb

## 2020-11-07 DIAGNOSIS — Z9012 Acquired absence of left breast and nipple: Secondary | ICD-10-CM | POA: Insufficient documentation

## 2020-11-07 DIAGNOSIS — Z86718 Personal history of other venous thrombosis and embolism: Secondary | ICD-10-CM | POA: Diagnosis not present

## 2020-11-07 DIAGNOSIS — I82441 Acute embolism and thrombosis of right tibial vein: Secondary | ICD-10-CM | POA: Insufficient documentation

## 2020-11-07 DIAGNOSIS — F17210 Nicotine dependence, cigarettes, uncomplicated: Secondary | ICD-10-CM | POA: Diagnosis not present

## 2020-11-07 DIAGNOSIS — Z853 Personal history of malignant neoplasm of breast: Secondary | ICD-10-CM | POA: Diagnosis not present

## 2020-11-07 DIAGNOSIS — Z7901 Long term (current) use of anticoagulants: Secondary | ICD-10-CM | POA: Insufficient documentation

## 2020-11-07 NOTE — Progress Notes (Signed)
Hematology/Oncology Consult note [facility]  [phone] [phone]  Patient Care Team: Joaquim Nam, MD as PCP - General (Family Medicine)   Name of the patient: Wendy Pruitt  [id number]  [birth year]   Date of visit: 11/07/20  Diagnosis- DVT in December 2020 right proximal lower extremity  Chief complaint/ Reason for visit-routine follow-up of chronic right lower extremity DVT  Heme/Onc history: Patient is a 65 yr old female with no significant medical problems. She had sudden onset pain in her right calf about 2 weeks ago. After pain did not subside she went to urgent care and had USG doppler which showed occlusive DVT in the right poplitea, peroneal and tibial veins. She has not had any prior DVT. She does not use HRT. No significant pregnancy complications or family h/o DVT. She is currently on xarelto and tolerating it well without significant side effects.  Patient did not wish to undergo genetic hypercoagulable work-up.  Testing for antiphospholipid antibody syndrome was negative.  Interval history-she has remained off Xarelto now for the last 6 months and has not had any recurrent leg swelling or shortness of breath.  She reports occasional discomfort in her leg when she crosses her legs but not otherwise.  She remains active  ECOG PS- 0 Pain scale- 0   Review of systems- Review of Systems  Constitutional: Negative for chills, fever, malaise/fatigue and weight loss.  HENT: Negative for congestion, ear discharge and nosebleeds.   Eyes: Negative for blurred vision.  Respiratory: Negative for cough, hemoptysis, sputum production, shortness of breath and wheezing.   Cardiovascular: Negative for chest pain, palpitations, orthopnea and claudication.  Gastrointestinal: Negative for abdominal pain, blood in stool, constipation, diarrhea, heartburn, melena, nausea and vomiting.  Genitourinary: Negative for dysuria, flank pain,  frequency, hematuria and urgency.  Musculoskeletal: Negative for back pain, joint pain and myalgias.  Skin: Negative for rash.  Neurological: Negative for dizziness, tingling, focal weakness, seizures, weakness and headaches.  Endo/Heme/Allergies: Does not bruise/bleed easily.  Psychiatric/Behavioral: Negative for depression and suicidal ideas. The patient does not have insomnia.       Allergies  Allergen Reactions  . Chantix [Varenicline] Other (See Comments)    Intolerant- abnormal dreams.    . Trazodone And Nefazodone Other (See Comments)    Nasal congestion  . Wellbutrin [Bupropion] Other (See Comments)    Intolerant.       Past Medical History:  Diagnosis Date  . Glaucoma, both eyes   . History of left breast cancer 2004--- per pt no recurrence   dx DCIS left breast s/p  total mastectomy w/ reconstruction,  NO chemo or radiation therpy  (ER and PR negative)  . History of Paget's disease of breast   . Insomnia   . PONV (postoperative nausea and vomiting)   . VIN II (vulvar intraepithelial neoplasia II)      Past Surgical History:  Procedure Laterality Date  . CATARACT EXTRACTION W/ INTRAOCULAR LENS IMPLANT  1990 approx.   " left eye I think"  . MASTECTOMY Left   . PARTIAL MASTECTOMY INCORPATING NIPPLE AREOLAR COMPLEX Left 12-28-2002   dr young   [facility]   hx paget's disease left nipple  . RE-EXCISION  PORTION OF THE LUMPECTOMY SITE Left 01-11-2003    dr young  [facility]  . TOTAL MASTECTOMY Left 02-16-2003  dr young [facility]   w/ AXILLARY LYMPH NODE DISSECTION AND IMMEDIATE BREAST RECONSTRUCTION WITH SALINE IMPLANT  . VAGINAL HYSTERECTOMY  1983 approx.  Marland Kitchen  VULVECTOMY N/A 02/11/2018   Procedure: WIDE EXCISION VULVECTOMY;  Surgeon: Cheri Fowler, MD;  Location: [facility];  Service: Gynecology;  Laterality: N/A;    Social History   Socioeconomic History  . Marital status: Divorced    Spouse name: Not on file  . Number of children: 2  . Years of education: Not  on file  . Highest education level: Not on file  Occupational History  . Occupation: Development worker, international aid: UNEMPLOYED  Tobacco Use  . Smoking status: Current Every Day Smoker    Packs/day: 0.50    Years: 30.00    Pack years: 15.00    Types: Cigarettes  . Smokeless tobacco: Never Used  Vaping Use  . Vaping Use: Never used  Substance and Sexual Activity  . Alcohol use: Not Currently    Alcohol/week: 0.0 standard drinks    Comment: occassionally  . Drug use: No  . Sexual activity: Not on file  Other Topics Concern  . 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     Social Determinants of Health   Financial Resource Strain: Not on file  Food Insecurity: Not on file  Transportation Needs: Not on file  Physical Activity: Not on file  Stress: Not on file  Social Connections: Not on file  Intimate Partner Violence: Not on file    Family History  Problem Relation Age of Onset  . Heart disease Mother 67       CHF  . Heart disease Father        MI  . Drug abuse Brother        In [place]; (drugs)  . Heart disease Brother        CAD (valve surg)  . Cancer Sister        lung cancer  . Cancer Brother   . Heart disease Sister        heart failure  . Breast cancer Daughter   . Breast cancer Maternal Grandmother   . Colon cancer Neg Hx      Current Outpatient Medications:  .  dorzolamide-timolol (COSOPT) 22.3-6.8 MG/ML ophthalmic solution, Place 1 drop into the right eye 2 (two) times daily., Disp: , Rfl:  .  eszopiclone (LUNESTA) 2 MG TABS tablet, TAKE 1/2 TO 1 TABLET BY MOUTH AT BEDTIME USE SPARINGLY AS NEEDED. INTOLERANT OF TRAZODONE., Disp: 30 tablet, Rfl: 5 .  latanoprost (XALATAN) 0.005 % ophthalmic solution, Place 1 drop into both eyes at bedtime. , Disp: , Rfl:  .  pimecrolimus (ELIDEL) 1 % cream, Apply 1 application topically  2 (two) times daily., Disp: 30 g, Rfl: 2 .  rivaroxaban (XARELTO) 20 MG TABS tablet, Take 1 tablet (20 mg total) by mouth daily with supper. Start only after finishing 15mg  starter pack (Patient not taking: No sig reported), Disp: 30 tablet, Rfl: 5  Physical exam:  Vitals:   11/07/20 1018  BP: 121/76  Pulse: 82  Temp: 98.9 F (37.2 C)  TempSrc: Tympanic  SpO2: 100%  Weight: 159 lb 14.4 oz (72.5 kg)   Physical Exam Eyes:     Extraocular Movements: EOM normal.  Cardiovascular:     Rate and Rhythm: Normal rate and regular rhythm.     Heart sounds: Normal heart sounds.  Pulmonary:     Effort: Pulmonary effort is normal.  Breath sounds: Normal breath sounds.  Musculoskeletal:     Right lower leg: No edema.     Left lower leg: No edema.  Skin:    General: Skin is warm and dry.  Neurological:     Mental Status: She is alert and oriented to person, place, and time.      CMP Latest Ref Rng & Units 12/07/2019  Glucose 70 - 99 mg/dL 100(H)  BUN 6 - 23 mg/dL 25(H)  Creatinine 0.40 - 1.20 mg/dL 0.93  Sodium 135 - 145 mEq/L 139  Potassium 3.5 - 5.1 mEq/L 4.2  Chloride 96 - 112 mEq/L 104  CO2 19 - 32 mEq/L 29  Calcium 8.4 - 10.5 mg/dL 9.0  Total Protein 6.0 - 8.3 g/dL 6.7  Total Bilirubin 0.2 - 1.2 mg/dL 0.6  Alkaline Phos 39 - 117 U/L 81  AST 0 - 37 U/L 16  ALT 0 - 35 U/L 7   CBC Latest Ref Rng & Units 12/07/2019  WBC 4.0 - 10.5 K/uL 8.0  Hemoglobin 12.0 - 15.0 g/dL 13.7  Hematocrit 36.0 - 46.0 % 41.5  Platelets 150.0 - 400.0 K/uL 332.0     Assessment and plan- Patient is a 65 y.o. female with history of right proximal lower extremity DVT in December 2020 was previously on Xarelto.  This is a routine follow-up visit  It has been 6 months since she stopped her anticoagulation.  Clinically she is doing well with no concerning Signs and symptoms of recurrent DVT at this time.  She can continue to follow-up with Dr. Damita Dunnings and can be referred to [place] in the future if questions or  concerns arise    Visit Diagnosis 1. History of DVT (deep vein thrombosis)      Dr. Randa Evens, MD, MPH [facility] [id number] 11/07/2020 9:27 AM

## 2020-11-08 ENCOUNTER — Telehealth: Payer: Self-pay

## 2020-11-08 ENCOUNTER — Telehealth: Payer: Self-pay | Admitting: Family Medicine

## 2020-11-08 NOTE — Telephone Encounter (Signed)
Sorry that should say *we

## 2020-11-08 NOTE — Telephone Encounter (Signed)
Who is er

## 2020-11-08 NOTE — Telephone Encounter (Signed)
LVM for pt to rtn my call to r/s appt with nha on 12/12/20. 

## 2020-11-08 NOTE — Telephone Encounter (Signed)
Patient came into the office wanting to know if er could start a PA for her Eszopiclone 2mg . States she got a message from an she doesn't understand why because she hasn't requested the refill yet for the medication but thinks they sent the message to get a head start on the PA for her medication    Please call and advise   eszopiclone (LUNESTA) 2 MG TABS tablet

## 2020-11-16 IMAGING — MG DIGITAL SCREENING UNILAT RIGHT W/ CAD
2 series · 2 of 2 positions shown · non-contrast
Comparison: Previous exam(s).

CLINICAL DATA: Screening.

EXAM:
DIGITAL SCREENING UNILATERAL RIGHT MAMMOGRAM WITH CAD

[R MLO]
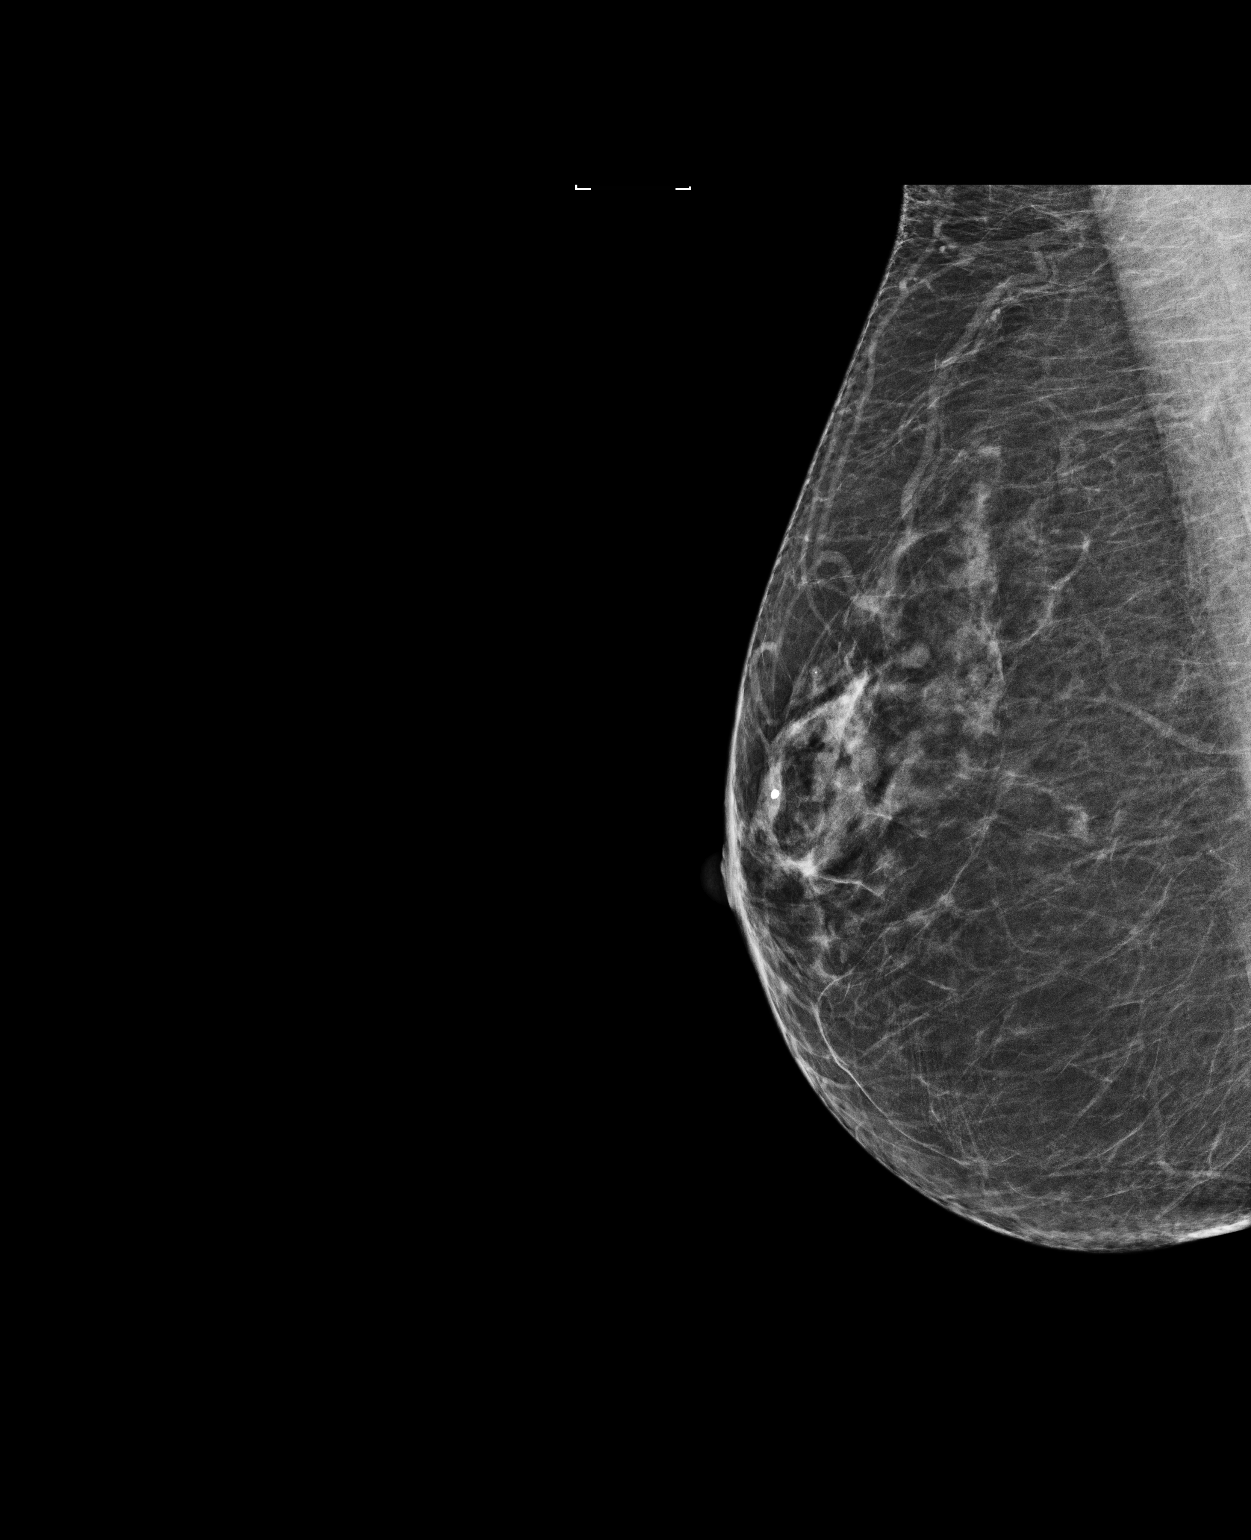

[R CC]
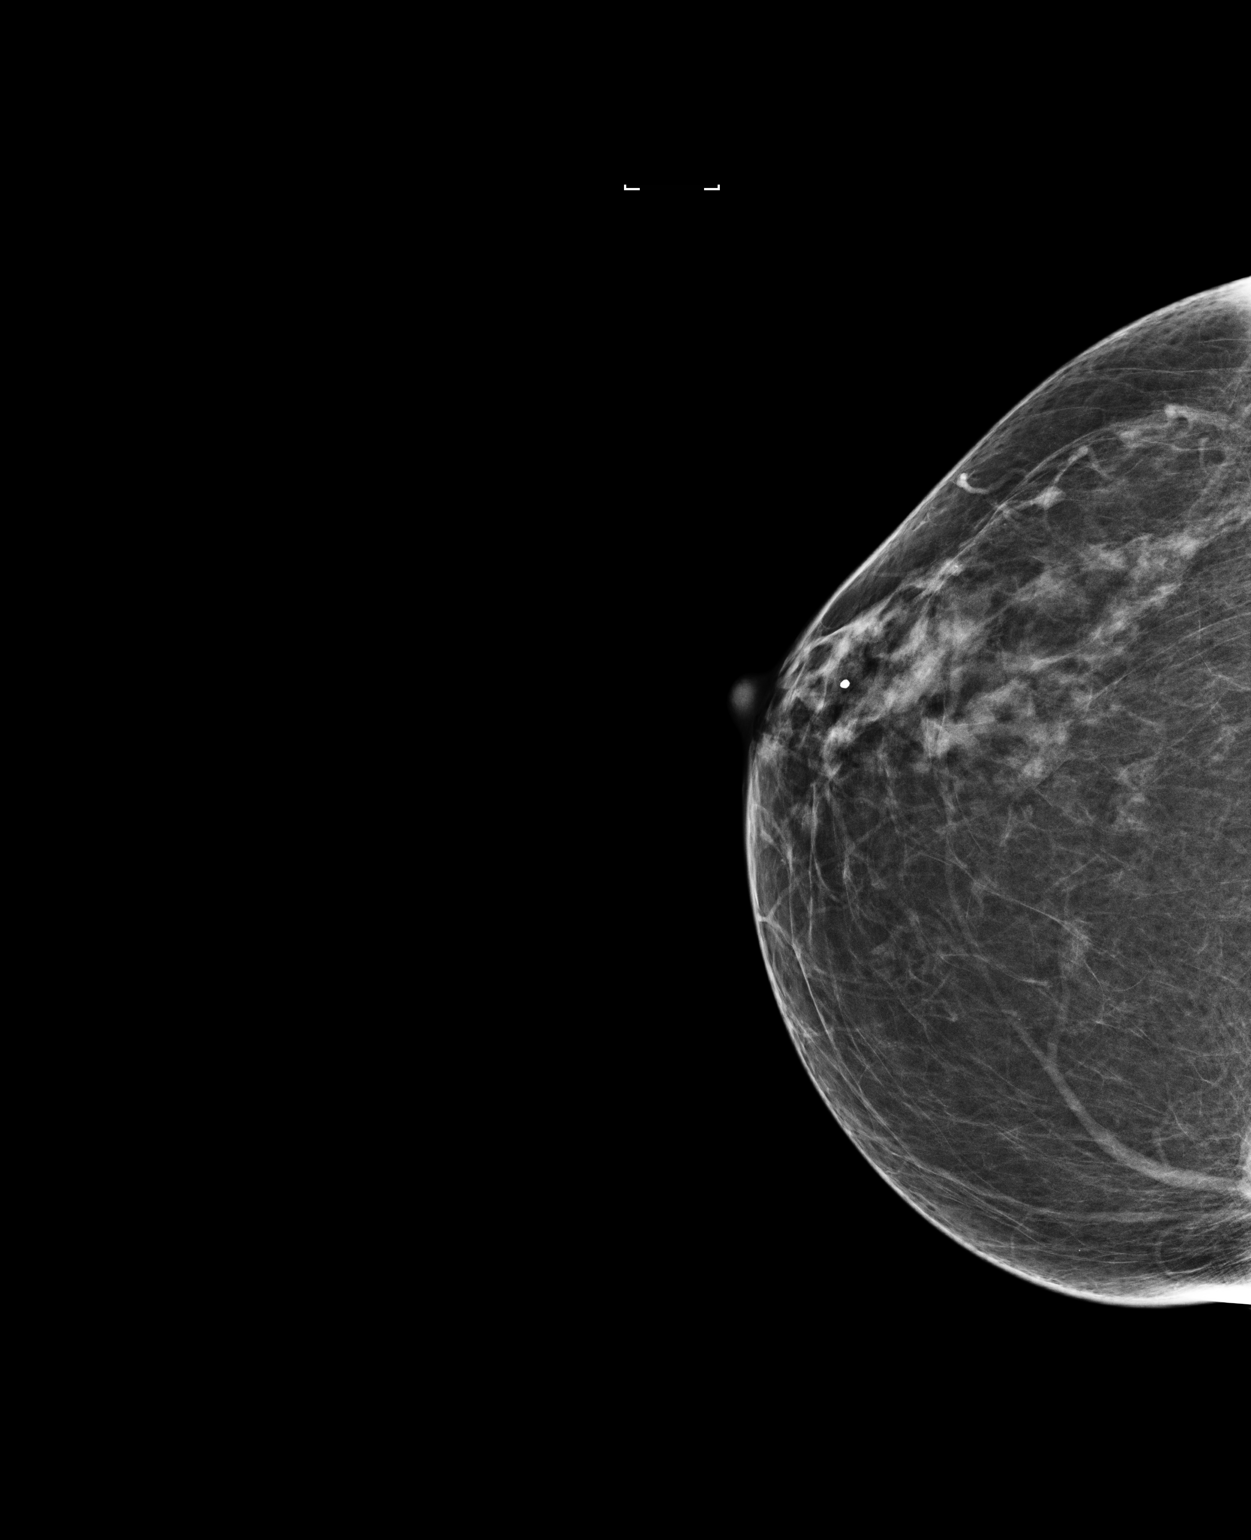

[2 of 2 positions shown; findings below may reference images not displayed]

ACR Breast Density Category b: There are scattered areas of
fibroglandular density.
FINDINGS: There are no findings suspicious for malignancy. Images were
processed with CAD.
IMPRESSION: No mammographic evidence of malignancy. A result letter of this
screening mammogram will be mailed directly to the patient.

RECOMMENDATION:
Screening mammogram in one year. (Code:BU-Z-YY8)

BI-RADS CATEGORY  1: Negative.

## 2020-11-22 ENCOUNTER — Telehealth: Payer: Self-pay | Admitting: Family Medicine

## 2020-11-22 NOTE — Telephone Encounter (Signed)
Sent. Thanks.   

## 2020-11-22 NOTE — Telephone Encounter (Signed)
Refill request for Lunesta  Mg tabs.  LOV - 02/16/20 Next OV - 12/12/20 Last refill - 05/12/20 #30/5

## 2020-11-24 NOTE — Telephone Encounter (Signed)
Patient is calling in about prior authorization on the medication. Please call and advise patient of what is going on with the medication . EM

## 2020-11-24 NOTE — Telephone Encounter (Signed)
I had sent the rx.  I haven't heard about PA need yet.  It may be approved this year or she may need PA.  If she has been told she definitely needs PA for 2022, then please start it.  Thanks.

## 2020-11-25 NOTE — Telephone Encounter (Signed)
Patient needs you to call her about a prior authorization for a medication EM  Please call her asap she has called multiple times and she is having issues with the insurance company

## 2020-11-25 NOTE — Telephone Encounter (Signed)
PA started with [employer] this afternoon via telephone. Case # [id number].

## 2020-11-25 NOTE — Telephone Encounter (Signed)
Spoke with patient and advised I will work on the PA this afternoon.  PA started with [employer] this afternoon via telephone. Case # [id number]

## 2020-11-28 NOTE — Telephone Encounter (Signed)
Received notification that PA was already previosly approved from 11/05/20 to 11/04/2021 and medication should be at the pharmacy. If pharmacy has questions they can call [phone].

## 2020-11-28 NOTE — Telephone Encounter (Signed)
Received notification that PA was already previosly approved from 11/05/20 to 11/04/2021 and medication should be at the pharmacy. If pharmacy has questions they can call [phone]. 

## 2020-11-29 ENCOUNTER — Encounter: Payer: Self-pay | Admitting: Primary Care

## 2020-11-29 ENCOUNTER — Ambulatory Visit: Payer: Medicare Other | Admitting: Primary Care

## 2020-11-29 ENCOUNTER — Other Ambulatory Visit: Payer: Self-pay

## 2020-11-29 ENCOUNTER — Ambulatory Visit
Admission: RE | Admit: 2020-11-29 | Discharge: 2020-11-29 | Disposition: A | Discharge status: Home / Self Care | Payer: Medicare Other | Source: Ambulatory Visit | Attending: Primary Care | Admitting: Primary Care

## 2020-11-29 VITALS — BP 118/58 | HR 95 | Temp 96.3°F | Ht 68.0 in | Wt 159.0 lb

## 2020-11-29 DIAGNOSIS — R42 Dizziness and giddiness: Secondary | ICD-10-CM

## 2020-11-29 NOTE — Assessment & Plan Note (Signed)
Acute lasting approximately 60 seconds yesterday, no symptoms since.  Today her neuro exam is completely unremarkable. No carotid bruits noted. She is not on statin therapy, LDL 1 year ago of 88.  EKG today with normal sinus rhythm, right bundle branch block, left axis.  No acute ST changes.  She does appear to have EKG changes compared to November 2020 as there is no obvious right bundle branch block from this time.  Echocardiogram from December 2020 reviewed.  She ate a large [place] meal prior to coming today, so she is not fasting for lipid panel check.  We will check a few basic labs today. Stat CT head ordered today to rule out stroke. I encouraged her to reach out to her cardiologist given the EKG changes.  She is stable for outpatient treatment given negative symptoms and grossly normal exam. Strict ED precautions provided.  Await results.  May consider carotid artery ultrasound

## 2020-11-29 NOTE — Progress Notes (Signed)
Subjective:    Patient ID: Wendy Pruitt, female    DOB: 11-27-1955, 65 y.o.   MRN: [id number]  HPI  This visit occurred during the SARS-CoV-2 public health emergency.  Safety protocols were in place, including screening questions prior to the visit, additional usage of staff PPE, and extensive cleaning of exam room while observing appropriate contact time as indicated for disinfecting solutions.   Wendy Pruitt is a 65 year old female patient of Dr. Damita Dunnings with a history of DVT, aortic atherosclerosis, hyperlipidemia, breast cancer, glaucoma, balance problems who presents today with a chief complaint of dizziness.  Diagnosed with DVT about 1-2 years ago, on Xarelto for about six months, was following with hematology at the time who discontinued her Xarelto (7 months ago). She saw her hematologist earlier this year, was released from her services. She took her first Covid-19 vaccine 11/16/20, two weeks ago.   About one month ago she had a dizzy spell to her right eye. She saw her ophthalmologist at the time, no changes in her eye exam, no suspicious findings.  Yesterday she was out to lunch with co-workers, she had come back from the bathroom, leaned across the booth seat to grab her purse, turned around to walk out of the restaurant and suddenly felt imbalanced/dizzy.  She attempted to walk forward but couldn't put her feet forward to walk at all. She leaned against the table of the booth to catch her balance for a few seconds.  When she felt better she attempted to walk, was able to walk this time but had to drag her feet in a side motion with the right foot leading. She felt dizzy again so she leaned against the wall until her symptoms resolved. Her entire episode lasted for about 60 seconds.  She had no symptoms for the remainder of the day.  During the episode she denied blurred vision, chest pain, shortness of breath, unilateral weakness, changes in speech.  She did feel "frozen" to her entire  body during the episode.  Since the episode she denies dizziness, numbness/tingling, chest pain, blurred vision, changes in speech, weakness. She walked about 1 mile two hours ago and tolerated well.   She denies a family history of stroke, but both parents had heart disease. Last lipid panel on file with LDL of 88 in February 2021. She does see a cardiologist, has an appointment for March 2022.  She has noticed her anxiety has increased over the last year.  Review of Systems  Eyes: Negative for visual disturbance.  Respiratory: Negative for shortness of breath.   Cardiovascular: Negative for chest pain.  Neurological: Positive for dizziness. Negative for speech difficulty, weakness, numbness and headaches.  Psychiatric/Behavioral: The patient is nervous/anxious.        Past Medical History:  Diagnosis Date  . Glaucoma, both eyes   . History of left breast cancer 2004--- per pt no recurrence   dx DCIS left breast s/p  total mastectomy w/ reconstruction,  NO chemo or radiation therpy  (ER and PR negative)  . History of Paget's disease of breast   . Insomnia   . PONV (postoperative nausea and vomiting)   . VIN II (vulvar intraepithelial neoplasia II)      Social History   Socioeconomic History  . Marital status: Divorced    Spouse name: Not on file  . Number of children: 2  . Years of education: Not on file  . Highest education level: Not on file  Occupational History  .  Occupation: Development worker, international aid: UNEMPLOYED  Tobacco Use  . Smoking status: Current Every Day Smoker    Packs/day: 0.50    Years: 30.00    Pack years: 15.00    Types: Cigarettes  . Smokeless tobacco: Never Used  Vaping Use  . Vaping Use: Never used  Substance and Sexual Activity  . Alcohol use: Not Currently    Alcohol/week: 0.0 standard drinks    Comment: occassionally  . Drug use: No  . Sexual activity: Not on file  Other Topics Concern  . Not on file  Social History Narrative   Divorced   2  kids local   As of 2021 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     Social Determinants of Health   Financial Resource Strain: Not on file  Food Insecurity: Not on file  Transportation Needs: Not on file  Physical Activity: Not on file  Stress: Not on file  Social Connections: Not on file  Intimate Partner Violence: Not on file    Past Surgical History:  Procedure Laterality Date  . CATARACT EXTRACTION W/ INTRAOCULAR LENS IMPLANT  1990 approx.   " left eye I think"  . MASTECTOMY Left   . PARTIAL MASTECTOMY INCORPATING NIPPLE AREOLAR COMPLEX Left 12-28-2002   dr young   [facility]   hx paget's disease left nipple  . RE-EXCISION  PORTION OF THE LUMPECTOMY SITE Left 01-11-2003    dr young  [facility]  . TOTAL MASTECTOMY Left 02-16-2003  dr young [facility]   w/ AXILLARY LYMPH NODE DISSECTION AND IMMEDIATE BREAST RECONSTRUCTION WITH SALINE IMPLANT  . VAGINAL HYSTERECTOMY  1983 approx.  Eugenie Norrie N/A 02/11/2018   Procedure: WIDE EXCISION VULVECTOMY;  Surgeon: Cheri Fowler, MD;  Location: [facility];  Service: Gynecology;  Laterality: N/A;    Family History  Problem Relation Age of Onset  . Heart disease Mother 93       CHF  . Heart disease Father        MI  . Drug abuse Brother        In [place]; (drugs)  . Heart disease Brother        CAD (valve surg)  . Cancer Sister        lung cancer  . Cancer Brother   . Heart disease Sister        heart failure  . Breast cancer Daughter   . Breast cancer Maternal Grandmother   . Colon cancer Neg Hx     Allergies  Allergen Reactions  . Chantix [Varenicline] Other (See Comments)    Intolerant- abnormal dreams.    . Trazodone And Nefazodone Other (See Comments)    Nasal congestion  . Wellbutrin [Bupropion] Other (See Comments)    Intolerant.      Current Outpatient Medications on File Prior to Visit  Medication Sig Dispense Refill  . dorzolamide-timolol  (COSOPT) 22.3-6.8 MG/ML ophthalmic solution Place 1 drop into the right eye 2 (two) times daily.    . eszopiclone (LUNESTA) 2 MG TABS tablet TAKE 1/2 TO 1 TABLET BY MOUTH AT BEDTIME USE SPARINGLY AS NEEDED. INTOLERANT OF TRAZODONE. 30 tablet 5  . latanoprost (XALATAN) 0.005 % ophthalmic solution Place 1 drop into both eyes at bedtime.     . pimecrolimus (ELIDEL) 1 % cream Apply 1 application topically 2 (two) times daily. 30 g 2   No current facility-administered medications on file prior to visit.  BP (!) 118/58   Pulse 95   Temp (!) 96.3 F (35.7 C) (Temporal)   Ht 5\' 8"  (1.727 m)   Wt 159 lb (72.1 kg)   SpO2 96%   BMI 24.18 kg/m    Objective:   Physical Exam Constitutional:      Appearance: She is well-nourished.  Neck:     Vascular: No carotid bruit.  Cardiovascular:     Rate and Rhythm: Normal rate and regular rhythm.  Pulmonary:     Effort: Pulmonary effort is normal.     Breath sounds: Normal breath sounds.  Musculoskeletal:     Cervical back: Normal range of motion and neck supple.  Skin:    General: Skin is warm and dry.  Neurological:     Mental Status: She is oriented to person, place, and time.     Cranial Nerves: No cranial nerve deficit.     Motor: No weakness.     Coordination: Romberg sign negative. Coordination normal. Finger-Nose-Finger Test normal.     Gait: Gait normal.  Psychiatric:        Mood and Affect: Mood and affect and mood normal.            Assessment & Plan:

## 2020-11-29 NOTE — Patient Instructions (Addendum)
Stop by the lab prior to leaving today. I will notify you of your results once received.   Please call your cardiologist with an update, you seem to have a change on your ECG from November 2020.  Stop by the front desk and speak with either Ashtyn or Anastasiya regarding your CT head.   Go to the hospital if you develop once sided weakness, difficulty with speech, numbness, increased dizziness with any of these symptoms.   It was a pleasure meeting you!

## 2020-11-30 LAB — COMPREHENSIVE METABOLIC PANEL
ALT: 5 U/L (ref 0–35)
AST: 14 U/L (ref 0–37)
Albumin: 3.9 g/dL (ref 3.5–5.2)
Alkaline Phosphatase: 87 U/L (ref 39–117)
BUN: 21 mg/dL (ref 6–23)
CO2: 31 mEq/L (ref 19–32)
Calcium: 9.2 mg/dL (ref 8.4–10.5)
Chloride: 106 mEq/L (ref 96–112)
Creatinine, Ser: 1.05 mg/dL (ref 0.40–1.20)
GFR: 55.36 mL/min — ABNORMAL LOW (ref >60.00)
Glucose, Bld: 82 mg/dL (ref 70–99)
Potassium: 4.7 mEq/L (ref 3.5–5.1)
Sodium: 143 mEq/L (ref 135–145)
Total Bilirubin: 0.5 mg/dL (ref 0.2–1.2)
Total Protein: 6.3 g/dL (ref 6.0–8.3)

## 2020-11-30 LAB — CBC
HCT: 40.8 % (ref 36.0–46.0)
Hemoglobin: 13.7 g/dL (ref 12.0–15.0)
MCHC: 33.6 g/dL (ref 30.0–36.0)
MCV: 91.1 fl (ref 78.0–100.0)
Platelets: 329 10*3/uL (ref 150.0–400.0)
RBC: 4.48 Mil/uL (ref 3.87–5.11)
RDW: 13.6 % (ref 11.5–15.5)
WBC: 8.2 10*3/uL (ref 4.0–10.5)

## 2020-11-30 LAB — TSH: TSH: 1.27 u[IU]/mL (ref 0.35–4.50)

## 2020-12-01 ENCOUNTER — Encounter: Payer: Self-pay | Admitting: Neurology

## 2020-12-01 ENCOUNTER — Telehealth: Payer: Self-pay | Admitting: Family Medicine

## 2020-12-01 DIAGNOSIS — R42 Dizziness and giddiness: Secondary | ICD-10-CM

## 2020-12-01 NOTE — Telephone Encounter (Signed)
MRI will not show a TIA.  By definition, TIA isn't visible on imaging.   I think it makes more sense to see cardiology tomorrow and then see neuro.   I would get the cardiology appointment done first before considering the MRI.   I would like input from [place] on this- please give me your thoughts and we can go from there.  Thanks.

## 2020-12-01 NOTE — Telephone Encounter (Signed)
Pt anxious about not having apt with neuro until March. Pt repoted the "incident"  she had was definitely a TIA and she does not know why Anda Kraft will not put that on the referral. Advised pt without a diagnosis from testing a provider cannot just place a diagnosis in a pts chart without testing to back that diagnosis up. Advised pt of PCP and Kate's recommendations of seeing cardiology first and MRI is not recommended at the moment and will not show a TIA. Pt is very anxious that this episode will happen when she is driving. Advised pt she could have someone else drive her and stop driving until she learned more about the episodes from cardiology or neurology. Pt reports she does not have people to drive her and she doesn't have any family. She then admitted she has family but they don't have time to drive her around or live too far away.  Pt reported hx of DVT one year ago when she was placed on blood thinner for 6 months and she also had the Moderna covid vaccine 9 days ago. She said these are reasons she is so concerned and also because she lives by herself.  Pt also reports she has had blurry vision episodes that last from 30-60 second approximately 10 days apart since the beginning or middle of Dec. Pt describes the blurry vision as "like having vaseline in my eyes." She is afraid this correlates with the episode she had this week. She said she saw ophthalmology for this and they said they did not see anything abnormal. She was not sure if this was relayed to [place] at the [place]. Advised this would be sent in the [facility] sent today. Advised pt again of plan of care and to make sure she makes it to the cardiology apt tomorrow and that PCP will be looking at what the cardiologist reports. Advised if pt had any other symptoms or any worsening symptoms to contact the office. Advised if pt had any difficulty talking or confusion, numbness/weakness on one side of face or body or severe headache to call 911. Pt verbalized  understanding and was appreciative of call.   Pt also wanted advise on whether she should get the second moderna vaccine due to her hx of DVT.    FYI-pt has CPE labs on 2/7 and CPE on 2/14

## 2020-12-01 NOTE — Telephone Encounter (Signed)
See below.  Would see cardiology first.  I put in the neuro referral.  I wouldn't get the MRI done yet. Thanks.

## 2020-12-01 NOTE — Telephone Encounter (Signed)
Patient called back and also would like to get MRI of brain scheduled. She states CT scan would not show TIA (her friend told her that sometimes MRI would actually show this more) and she would like to have that done. Patient states Anda Kraft told her to let us know if her symptoms re occur again but patient does not want to sit and wait for these symptoms to happen again. Patient is so stressed with this, she lives alone and does not want to have anything like this happen again.  FYI: patient states she has not seen Dr Damita Dunnings since she was taking off her blood thinners and wanted to mention that also.  I am sending to Dr Damita Dunnings know also so he can be up to date also.  Seen cardiology tomorrow morning on 12/02/20 for EKG changes noted during her visit with Anda Kraft.

## 2020-12-01 NOTE — Telephone Encounter (Signed)
I agree with Dr. Damita Dunnings, start with cardiology scheduled and then go from there.

## 2020-12-01 NOTE — Telephone Encounter (Signed)
Pt is requesting a referral to be placed so she can go see Neurology as soon as possible regarding the stroke like symptoms she experienced. Please advise.

## 2020-12-01 NOTE — Progress Notes (Signed)
Cardiology Office Note:   Date:  12/02/2020  NAME:  Wendy Pruitt    MRN: [id number] DOB:  October 23, 1956   PCP:  Tonia Ghent, MD  Cardiologist:  No primary care provider on file.  Electrophysiologist:  None   Referring MD: Tonia Ghent, MD   Chief Complaint  Patient presents with  . Dizziness   History of Present Illness:   Wendy Pruitt is a 65 y.o. female with a hx of non-cardiac CP (normal CCTA), tobacco abuse, mild to moderate aortic atherosclerosis, HLD, DVT who presents for follow-up. EKG with RBBB and LAFB at PCP. Follow-up today.  She reports on 11/29/2020 she had an episode of dizziness.  Apparently she was out to lunch with friends.  She just gone to the bathroom and picked her purse up.  She was then walking out of the restaurant and felt dizzy and lightheaded.  She reports she felt like she was going to pass out but did not.  She reports she was in a daze unable to move her feet.  Symptoms lasted roughly 2 to 3 minutes and resolved without any intervention.  She reports no chest pain or shortness of breath with the event.  No palpitations.  She did not pass out.  Her EKG at her primary care physician office demonstrates right bundle branch block and left anterior fascicular block.  EKG today also demonstrates that.  She is a normal PR interval.  She has no limitations with walking.  She can walk 20 to 30 minutes without any chest pain or shortness of breath.  She can walk up to 2 miles without any issues.  This was an alarming symptom for her.  She is never had these before.  She underwent a CT head that was negative.  No evidence of stroke.  She had no further episodes.  Blood pressure 142/75.  She is still smoking several cigarettes per day.  She completed 6 months of therapy for her DVT.  She is no longer on anticoagulation.  She had an echocardiogram that was normal.  Coronary CTA normal as well.  On questioning her water intake.  She reports she may drink 20 ounces of water  per day.  She reports in the summertime water intake is increased.  However during the winter months she does not drink enough water.  She had not had much to drink that day either.  Problem List 1. Glaucoma  2. Aortic atherosclerosis  -mild to moderate atherosclerosis in the descending aorta -normal CCTA, CAC score 0 11/13/2019 3. Tobacco abuse  4. DVT -R popliteal vein 10/21/2019 -Completed 6 months of therapy 5. HLD -T chol 149, HDL 41, LDL 88, TG 97 6. RBBB/LAFB  Past Medical History: Past Medical History:  Diagnosis Date  . Glaucoma, both eyes   . History of left breast cancer 2004--- per pt no recurrence   dx DCIS left breast s/p  total mastectomy w/ reconstruction,  NO chemo or radiation therpy  (ER and PR negative)  . History of Paget's disease of breast   . Insomnia   . PONV (postoperative nausea and vomiting)   . VIN II (vulvar intraepithelial neoplasia II)     Past Surgical History: Past Surgical History:  Procedure Laterality Date  . CATARACT EXTRACTION W/ INTRAOCULAR LENS IMPLANT  1990 approx.   " left eye I think"  . MASTECTOMY Left   . PARTIAL MASTECTOMY INCORPATING NIPPLE AREOLAR COMPLEX Left 12-28-2002   dr young   [facility]  hx paget's disease left nipple  . RE-EXCISION  PORTION OF THE LUMPECTOMY SITE Left 01-11-2003    dr young  [facility]  . TOTAL MASTECTOMY Left 02-16-2003  dr young [facility]   w/ AXILLARY LYMPH NODE DISSECTION AND IMMEDIATE BREAST RECONSTRUCTION WITH SALINE IMPLANT  . VAGINAL HYSTERECTOMY  1983 approx.  Eugenie Norrie N/A 02/11/2018   Procedure: WIDE EXCISION VULVECTOMY;  Surgeon: Cheri Fowler, MD;  Location: [facility];  Service: Gynecology;  Laterality: N/A;    Current Medications: Current Meds  Medication Sig  . aspirin EC 81 MG tablet Take 1 tablet (81 mg total) by mouth daily. Swallow whole.  . dorzolamide-timolol (COSOPT) 22.3-6.8 MG/ML ophthalmic solution Place 1 drop into the right eye 2 (two) times daily.  . eszopiclone  (LUNESTA) 2 MG TABS tablet TAKE 1/2 TO 1 TABLET BY MOUTH AT BEDTIME USE SPARINGLY AS NEEDED. INTOLERANT OF TRAZODONE.  Marland Kitchen latanoprost (XALATAN) 0.005 % ophthalmic solution Place 1 drop into both eyes at bedtime.   . pimecrolimus (ELIDEL) 1 % cream Apply 1 application topically 2 (two) times daily.     Allergies:    Chantix [varenicline], Trazodone and nefazodone, and Wellbutrin [bupropion]   Social History: Social History   Socioeconomic History  . Marital status: Divorced    Spouse name: Not on file  . Number of children: 2  . Years of education: Not on file  . Highest education level: Not on file  Occupational History  . Occupation: Development worker, international aid: UNEMPLOYED  Tobacco Use  . Smoking status: Current Every Day Smoker    Packs/day: 0.50    Years: 30.00    Pack years: 15.00    Types: Cigarettes  . Smokeless tobacco: Never Used  Vaping Use  . Vaping Use: Never used  Substance and Sexual Activity  . Alcohol use: Not Currently    Alcohol/week: 0.0 standard drinks    Comment: occassionally  . Drug use: No  . Sexual activity: Not on file  Other Topics Concern  . 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     Social Determinants of Health   Financial Resource Strain: Not on file  Food Insecurity: Not on file  Transportation Needs: Not on file  Physical Activity: Not on file  Stress: Not on file  Social Connections: Not on file     Family History: The patient's family history includes Breast cancer in her daughter and maternal grandmother; Cancer in her brother and sister; Drug abuse in her brother; Heart disease in her brother, father, and sister; Heart disease (age of onset: 60) in her mother. There is no history of Colon cancer.  ROS:   All other ROS reviewed and negative. Pertinent positives noted in the HPI.      EKGs/Labs/Other Studies Reviewed:   The following studies were personally reviewed by me today:  EKG:  EKG is ordered today.  The ekg ordered today demonstrates normal sinus rhythm, right bundle branch block, left anterior fascicular block, and was personally reviewed by me.   CCTA 11/02/2019  IMPRESSION: 1. Coronary calcium score of 0.  2. Normal coronary origin with right dominance.  3. No evidence of CAD.  4. Mid LAD myocardial bridge (normal variant).  5. Mild to moderate atherosclerosis in the descending aorta.  RECOMMENDATIONS: 1. No evidence of CAD (0%). Consider  non-atherosclerotic causes of chest pain.  2. Risk factor modification recommended for atherosclerotic disease in descending aorta.  Echo 10/07/2019 1. Left ventricular ejection fraction, by visual estimation, is 60 to  65%. The left ventricle has normal function. There is mildly increased  left ventricular hypertrophy.  2. Global right ventricle has normal systolic function.The right  ventricular size is normal. No increase in right ventricular wall  thickness.  3. Left atrial size was normal.  4. Right atrial size was normal.  5. Mild mitral annular calcification.  6. The mitral valve is normal in structure. Mild mitral valve  regurgitation. No evidence of mitral stenosis.  7. The tricuspid valve is normal in structure. Tricuspid valve  regurgitation is mild.  8. The aortic valve is tricuspid. Aortic valve regurgitation is not  visualized. Mild aortic valve sclerosis without stenosis.  9. The pulmonic valve was grossly normal. Pulmonic valve regurgitation is  trivial.  10. Normal pulmonary artery systolic pressure.  11. The inferior vena cava is normal in size with greater than 50%  respiratory variability, suggesting right atrial pressure of 3 mmHg.   Recent Labs: 11/29/2020: ALT 5; BUN 21; Creatinine, Ser 1.05; Hemoglobin 13.7; Platelets 329.0; Potassium 4.7; Sodium 143; TSH 1.27    Recent Lipid Panel    Component Value Date/Time   CHOL 149 12/07/2019 0807   TRIG 97.0 12/07/2019 0807   HDL 41.30 12/07/2019 0807   CHOLHDL 4 12/07/2019 0807   VLDL 19.4 12/07/2019 0807   LDLCALC 88 12/07/2019 0807    Physical Exam:   VS:  BP (!) 142/75   Pulse 77   Ht 5' 8.5" (1.74 m)   Wt 158 lb (71.7 kg)   SpO2 99%   BMI 23.67 kg/m    Wt Readings from Last 3 Encounters:  12/02/20 158 lb (71.7 kg)  11/29/20 159 lb (72.1 kg)  11/07/20 159 lb 14.4 oz (72.5 kg)    General: Well nourished, well developed, in no acute distress Head: Atraumatic, normal size  Eyes: PEERLA, EOMI  Neck: Supple, no JVD Endocrine: No thryomegaly Cardiac: Normal S1, S2; RRR; no murmurs, rubs, or gallops Lungs: Clear to auscultation bilaterally, no wheezing, rhonchi or rales  Abd: Soft, nontender, no hepatomegaly  Ext: No edema, pulses 2+ Musculoskeletal: No deformities, BUE and BLE strength normal and equal Skin: Warm and dry, no rashes   Neuro: Alert and oriented to person, place, time, and situation, CNII-XII grossly intact, no focal deficits  Psych: Normal mood and affect   ASSESSMENT:   Wendy Pruitt is a 65 y.o. female who presents for the following: 1. Dizziness   2. RBBB   3. LAFB (left anterior fascicular block)   4. Aortic atherosclerosis ([place])   5. Mixed hyperlipidemia   6. Tobacco abuse     PLAN:   1. Dizziness -Symptoms are consistent with orthostasis.  She does not drink enough water.  Maybe 20 ounces per day.  She had dizziness and inability to move her legs for roughly 2 to 3 minutes.  She did not pass out.  This occurred after using the restroom and with position change.  She had no chest pain or shortness of breath suggest cardiac pathology.  She does have a right bundle branch block and left intrafascicular block but she has normal PR interval.  Her symptoms are not consistent with worsening conduction disease.  Her EKG is actually similar to the past.  She had  nonspecific IVCD and now just has a right bundle branch  block with left anterior fascicular block.  This is related to smoking.  I see no need for a heart monitor.  We did discuss pursuing carotid ultrasound just to make sure there is nothing else going on.  She does have aortic atherosclerosis and I recommended aspirin.  She does not need any stress testing as she has normal coronary arteries.  She had a normal echocardiogram recently.  I do agree with moving forward with neurology evaluation just to make sure nothing is going on.  It is reassuring her CT head is normal.  2. RBBB 3. LAFB (left anterior fascicular block) -Bifascicular block.  Does not explain her symptoms.  She is a normal PR interval.  She can walk 2 miles without limitations.  Symptoms of dizziness occur with position change and not drinking of water.  I am not concerned for worsening conduction disease.  I do agree with moving forward with neurology evaluation.  4. Aortic atherosclerosis (HCC) -Aspirin 81 mg a day.  We discussed Crestor 5 mg a day.  She would like to wait to see what her lipid profile is.  5. Mixed hyperlipidemia -Aortic atherosclerosis.  I recommended Crestor 5 mg a day.  She will talk with her primary care physician about this.  6. Tobacco abuse -Smoking cessation counseling provided.  Disposition: Return in about 1 year (around 12/02/2021).  Medication Adjustments/Labs and Tests Ordered: Current medicines are reviewed at length with the patient today.  Concerns regarding medicines are outlined above.  Orders Placed This Encounter  Procedures  . EKG 12-Lead  . VAS US CAROTID   Meds ordered this encounter  Medications  . aspirin EC 81 MG tablet    Sig: Take 1 tablet (81 mg total) by mouth daily. Swallow whole.    Dispense:  90 tablet    Refill:  3    Patient Instructions  Medication Instructions:  Start aspirin 81 mg (over the counter)  *If you need a refill on your cardiac medications before  your next appointment, please call your pharmacy*  Testing/Procedures: Your physician has requested that you have a carotid duplex. This test is an ultrasound of the carotid arteries in your neck. It looks at blood flow through these arteries that supply the brain with blood. Allow one hour for this exam. There are no restrictions or special instructions.  Follow-Up: At [facility], you and your health needs are our priority.  As part of our continuing mission to provide you with exceptional heart care, we have created designated Provider Care Teams.  These Care Teams include your primary Cardiologist (physician) and Advanced Practice Providers (APPs -  Physician Assistants and Nurse Practitioners) who all work together to provide you with the care you need, when you need it.  We recommend signing up for the patient portal called "MyChart".  Sign up information is provided on this After Visit Summary.  MyChart is used to connect with patients for Virtual Visits (Telemedicine).  Patients are able to view lab/test results, encounter notes, upcoming appointments, etc.  Non-urgent messages can be sent to your provider as well.   To learn more about what you can do with MyChart, go to [email or web address].    Your next appointment:   12 month(s)  The format for your next appointment:   In Person  Provider:   Eleonore Chiquito, MD        Time Spent with Patient: I have spent a total of 35 minutes with patient reviewing hospital notes, telemetry, EKGs, labs  and examining the patient as well as establishing an assessment and plan that was discussed with the patient.  > 50% of time was spent in direct patient care.  Signed, Addison Naegeli. Audie Box, [place]  [address] [place], [place] [address] [phone]  12/02/2020 10:57 AM

## 2020-12-02 ENCOUNTER — Encounter: Payer: Self-pay | Admitting: Cardiovascular Disease

## 2020-12-02 ENCOUNTER — Other Ambulatory Visit: Payer: Self-pay

## 2020-12-02 ENCOUNTER — Ambulatory Visit: Payer: Medicare Other | Admitting: Cardiovascular Disease

## 2020-12-02 VITALS — BP 142/75 | HR 77 | Ht 68.5 in | Wt 158.0 lb

## 2020-12-02 DIAGNOSIS — I444 Left anterior fascicular block: Secondary | ICD-10-CM

## 2020-12-02 DIAGNOSIS — E782 Mixed hyperlipidemia: Secondary | ICD-10-CM

## 2020-12-02 DIAGNOSIS — I4510 Unspecified right bundle-branch block: Secondary | ICD-10-CM | POA: Diagnosis not present

## 2020-12-02 DIAGNOSIS — R42 Dizziness and giddiness: Secondary | ICD-10-CM | POA: Diagnosis not present

## 2020-12-02 DIAGNOSIS — Z720 Tobacco use: Secondary | ICD-10-CM

## 2020-12-02 DIAGNOSIS — I700 Atherosclerosis of aorta: Secondary | ICD-10-CM | POA: Diagnosis not present

## 2020-12-02 MED ORDER — ASPIRIN EC 81 MG PO TBEC: 81.0000 mg | DELAYED_RELEASE_TABLET | Freq: Every day | ORAL | 3 refills | Status: DC

## 2020-12-02 NOTE — Telephone Encounter (Signed)
Agreed, thanks.  Yes, she should still get vaccinated with 2nd moderna dose per protocol.

## 2020-12-02 NOTE — Patient Instructions (Signed)
Medication Instructions:  Start aspirin 81 mg (over the counter)  *If you need a refill on your cardiac medications before your next appointment, please call your pharmacy*  Testing/Procedures: Your physician has requested that you have a carotid duplex. This test is an ultrasound of the carotid arteries in your neck. It looks at blood flow through these arteries that supply the brain with blood. Allow one hour for this exam. There are no restrictions or special instructions.  Follow-Up: At [facility], you and your health needs are our priority.  As part of our continuing mission to provide you with exceptional heart care, we have created designated Provider Care Teams.  These Care Teams include your primary Cardiologist (physician) and Advanced Practice Providers (APPs -  Physician Assistants and Nurse Practitioners) who all work together to provide you with the care you need, when you need it.  We recommend signing up for the patient portal called "MyChart".  Sign up information is provided on this After Visit Summary.  MyChart is used to connect with patients for Virtual Visits (Telemedicine).  Patients are able to view lab/test results, encounter notes, upcoming appointments, etc.  Non-urgent messages can be sent to your provider as well.   To learn more about what you can do with MyChart, go to [email or web address].    Your next appointment:   12 month(s)  The format for your next appointment:   In Person  Provider:   Eleonore Chiquito, MD

## 2020-12-04 ENCOUNTER — Other Ambulatory Visit: Payer: Self-pay | Admitting: Family Medicine

## 2020-12-04 DIAGNOSIS — E785 Hyperlipidemia, unspecified: Secondary | ICD-10-CM

## 2020-12-04 NOTE — Telephone Encounter (Signed)
Will defer further recommendations and treatment to cardiology and PCP. She mentioned her visual changes that occurred in December, she also endorsed seeing her eye doctor as well. She denied further episodes of blurred vision during our visit.

## 2020-12-12 ENCOUNTER — Ambulatory Visit (HOSPITAL_COMMUNITY)
Admission: RE | Admit: 2020-12-12 | Discharge: 2020-12-12 | Disposition: A | Discharge status: Home / Self Care | Payer: Medicare Other | Source: Ambulatory Visit | Attending: Cardiology | Admitting: Cardiology

## 2020-12-12 ENCOUNTER — Other Ambulatory Visit: Payer: Self-pay

## 2020-12-12 ENCOUNTER — Other Ambulatory Visit (INDEPENDENT_AMBULATORY_CARE_PROVIDER_SITE_OTHER): Payer: Medicare Other

## 2020-12-12 ENCOUNTER — Ambulatory Visit: Payer: Medicare Other

## 2020-12-12 DIAGNOSIS — R42 Dizziness and giddiness: Secondary | ICD-10-CM | POA: Insufficient documentation

## 2020-12-12 DIAGNOSIS — E785 Hyperlipidemia, unspecified: Secondary | ICD-10-CM | POA: Diagnosis not present

## 2020-12-12 LAB — LIPID PANEL
Cholesterol: 156 mg/dL (ref 0–200)
HDL: 43.9 mg/dL (ref >39.00)
LDL Cholesterol: 96 mg/dL (ref 0–99)
NonHDL: 111.64
Total CHOL/HDL Ratio: 4
Triglycerides: 77 mg/dL (ref 0.0–149.0)
VLDL: 15.4 mg/dL (ref 0.0–40.0)

## 2020-12-14 ENCOUNTER — Other Ambulatory Visit: Payer: Self-pay

## 2020-12-14 ENCOUNTER — Ambulatory Visit (INDEPENDENT_AMBULATORY_CARE_PROVIDER_SITE_OTHER): Payer: Medicare Other

## 2020-12-14 DIAGNOSIS — Z0000 Encounter for general adult medical examination without abnormal findings: Secondary | ICD-10-CM

## 2020-12-14 NOTE — Patient Instructions (Signed)
Wendy Pruitt , Thank you for taking time to come for your Medicare Wellness Visit. I appreciate your ongoing commitment to your health goals. Please review the following plan we discussed and let me know if I can assist you in the future.   Screening recommendations/referrals: Colonoscopy: Cologuard completed 01/27/2019, due 01/2022 Mammogram: Up to date, completed 07/18/2020, due 07/2021 Bone Density: declined Recommended yearly ophthalmology/optometry visit for glaucoma screening and checkup Recommended yearly dental visit for hygiene and checkup  Vaccinations: Influenza vaccine: Up to date, completed 08/19/2020, due 06/2021 Pneumococcal vaccine: due, will discuss with provider  Tdap vaccine: decline-insurance Shingles vaccine: due, check with your insurance regarding coverage if interested    Covid-19:Completed series  Advanced directives: Advance directive discussed with you today. Even though you declined this today please call our office should you change your mind and we can give you the proper paperwork for you to fill out.  Conditions/risks identified: hyperlipidemia   Next appointment: Follow up in one year for your annual wellness visit    Preventive Care 65 Years and Older, Female Preventive care refers to lifestyle choices and visits with your health care provider that can promote health and wellness. What does preventive care include?  A yearly physical exam. This is also called an annual well check.  Dental exams once or twice a year.  Routine eye exams. Ask your health care provider how often you should have your eyes checked.  Personal lifestyle choices, including:  Daily care of your teeth and gums.  Regular physical activity.  Eating a healthy diet.  Avoiding tobacco and drug use.  Limiting alcohol use.  Practicing safe sex.  Taking low-dose aspirin every day.  Taking vitamin and mineral supplements as recommended by your health care provider. What  happens during an annual well check? The services and screenings done by your health care provider during your annual well check will depend on your age, overall health, lifestyle risk factors, and family history of disease. Counseling  Your health care provider may ask you questions about your:  Alcohol use.  Tobacco use.  Drug use.  Emotional well-being.  Home and relationship well-being.  Sexual activity.  Eating habits.  History of falls.  Memory and ability to understand (cognition).  Work and work Statistician.  Reproductive health. Screening  You may have the following tests or measurements:  Height, weight, and BMI.  Blood pressure.  Lipid and cholesterol levels. These may be checked every 5 years, or more frequently if you are over 3 years old.  Skin check.  Lung cancer screening. You may have this screening every year starting at age 35 if you have a 30-pack-year history of smoking and currently smoke or have quit within the past 15 years.  Fecal occult blood test (FOBT) of the stool. You may have this test every year starting at age 47.  Flexible sigmoidoscopy or colonoscopy. You may have a sigmoidoscopy every 5 years or a colonoscopy every 10 years starting at age 40.  Hepatitis C blood test.  Hepatitis B blood test.  Sexually transmitted disease (STD) testing.  Diabetes screening. This is done by checking your blood sugar (glucose) after you have not eaten for a while (fasting). You may have this done every 1-3 years.  Bone density scan. This is done to screen for osteoporosis. You may have this done starting at age 81.  Mammogram. This may be done every 1-2 years. Talk to your health care provider about how often you should have regular  mammograms. Talk with your health care provider about your test results, treatment options, and if necessary, the need for more tests. Vaccines  Your health care provider may recommend certain vaccines, such  as:  Influenza vaccine. This is recommended every year.  Tetanus, diphtheria, and acellular pertussis (Tdap, Td) vaccine. You may need a Td booster every 10 years.  Zoster vaccine. You may need this after age 74.  Pneumococcal 13-valent conjugate (PCV13) vaccine. One dose is recommended after age 67.  Pneumococcal polysaccharide (PPSV23) vaccine. One dose is recommended after age 31. Talk to your health care provider about which screenings and vaccines you need and how often you need them. This information is not intended to replace advice given to you by your health care provider. Make sure you discuss any questions you have with your health care provider. Document Released: 11/18/2015 Document Revised: 07/11/2016 Document Reviewed: 08/23/2015 Elsevier Interactive Patient Education  2017 [place] Prevention in the Home Falls can cause injuries. They can happen to people of all ages. There are many things you can do to make your home safe and to help prevent falls. What can I do on the outside of my home?  Regularly fix the edges of walkways and driveways and fix any cracks.  Remove anything that might make you trip as you walk through a door, such as a raised step or threshold.  Trim any bushes or trees on the path to your home.  Use bright outdoor lighting.  Clear any walking paths of anything that might make someone trip, such as rocks or tools.  Regularly check to see if handrails are loose or broken. Make sure that both sides of any steps have handrails.  Any raised decks and porches should have guardrails on the edges.  Have any leaves, snow, or ice cleared regularly.  Use sand or salt on walking paths during winter.  Clean up any spills in your garage right away. This includes oil or grease spills. What can I do in the bathroom?  Use night lights.  Install grab bars by the toilet and in the tub and shower. Do not use towel bars as grab bars.  Use  non-skid mats or decals in the tub or shower.  If you need to sit down in the shower, use a plastic, non-slip stool.  Keep the floor dry. Clean up any water that spills on the floor as soon as it happens.  Remove soap buildup in the tub or shower regularly.  Attach bath mats securely with double-sided non-slip rug tape.  Do not have throw rugs and other things on the floor that can make you trip. What can I do in the bedroom?  Use night lights.  Make sure that you have a light by your bed that is easy to reach.  Do not use any sheets or blankets that are too big for your bed. They should not hang down onto the floor.  Have a firm chair that has side arms. You can use this for support while you get dressed.  Do not have throw rugs and other things on the floor that can make you trip. What can I do in the kitchen?  Clean up any spills right away.  Avoid walking on wet floors.  Keep items that you use a lot in easy-to-reach places.  If you need to reach something above you, use a strong step stool that has a grab bar.  Keep electrical cords out of the way.  Do not use floor polish or wax that makes floors slippery. If you must use wax, use non-skid floor wax.  Do not have throw rugs and other things on the floor that can make you trip. What can I do with my stairs?  Do not leave any items on the stairs.  Make sure that there are handrails on both sides of the stairs and use them. Fix handrails that are broken or loose. Make sure that handrails are as long as the stairways.  Check any carpeting to make sure that it is firmly attached to the stairs. Fix any carpet that is loose or worn.  Avoid having throw rugs at the top or bottom of the stairs. If you do have throw rugs, attach them to the floor with carpet tape.  Make sure that you have a light switch at the top of the stairs and the bottom of the stairs. If you do not have them, ask someone to add them for you. What  else can I do to help prevent falls?  Wear shoes that:  Do not have high heels.  Have rubber bottoms.  Are comfortable and fit you well.  Are closed at the toe. Do not wear sandals.  If you use a stepladder:  Make sure that it is fully opened. Do not climb a closed stepladder.  Make sure that both sides of the stepladder are locked into place.  Ask someone to hold it for you, if possible.  Clearly mark and make sure that you can see:  Any grab bars or handrails.  First and last steps.  Where the edge of each step is.  Use tools that help you move around (mobility aids) if they are needed. These include:  Canes.  Walkers.  Scooters.  Crutches.  Turn on the lights when you go into a dark area. Replace any light bulbs as soon as they burn out.  Set up your furniture so you have a clear path. Avoid moving your furniture around.  If any of your floors are uneven, fix them.  If there are any pets around you, be aware of where they are.  Review your medicines with your doctor. Some medicines can make you feel dizzy. This can increase your chance of falling. Ask your doctor what other things that you can do to help prevent falls. This information is not intended to replace advice given to you by your health care provider. Make sure you discuss any questions you have with your health care provider. Document Released: 08/18/2009 Document Revised: 03/29/2016 Document Reviewed: 11/26/2014 Elsevier Interactive Patient Education  2017 [employer].

## 2020-12-14 NOTE — Progress Notes (Signed)
PCP notes:  Health Maintenance: Prevnar 39- due Dexa- declined   Abnormal Screenings: none   Patient concerns: none   Nurse concerns: none   Next PCP appt.: 12/19/2020 @ 11 am

## 2020-12-14 NOTE — Progress Notes (Signed)
Subjective:   Wendy Pruitt is a 65 y.o. female who presents for Medicare Annual (Subsequent) preventive examination.  Review of Systems: N/A     I connected with the patient today by telephone and verified that I am speaking with the correct person using two identifiers. Location patient: home Location nurse: work Persons participating in the telephone visit: patient, nurse.   I discussed the limitations, risks, security and privacy concerns of performing an evaluation and management service by telephone and the availability of in person appointments. I also discussed with the patient that there may be a patient responsible charge related to this service. The patient expressed understanding and verbally consented to this telephonic visit.        Cardiac Risk Factors include: advanced age (>22men, >99 women);Other (see comment), Risk factor comments: hyperlipidemia     Objective:    Today's Vitals   There is no height or weight on file to calculate BMI.  Advanced Directives 12/14/2020 11/07/2020 04/29/2020 10/23/2019 02/11/2018  Does Patient Have a Medical Advance Directive? No No No No No  Would patient like information on creating a medical advance directive? No - Patient declined - No - Patient declined No - Patient declined No - Patient declined    Current Medications (verified) Outpatient Encounter Medications as of 12/14/2020  Medication Sig  . aspirin EC 81 MG tablet Take 1 tablet (81 mg total) by mouth daily. Swallow whole.  . dorzolamide-timolol (COSOPT) 22.3-6.8 MG/ML ophthalmic solution Place 1 drop into the right eye 2 (two) times daily.  . eszopiclone (LUNESTA) 2 MG TABS tablet TAKE 1/2 TO 1 TABLET BY MOUTH AT BEDTIME USE SPARINGLY AS NEEDED. INTOLERANT OF TRAZODONE.  Marland Kitchen latanoprost (XALATAN) 0.005 % ophthalmic solution Place 1 drop into both eyes at bedtime.   . pimecrolimus (ELIDEL) 1 % cream Apply 1 application topically 2 (two) times daily.   No  facility-administered encounter medications on file as of 12/14/2020.    Allergies (verified) Chantix [varenicline], Trazodone and nefazodone, and Wellbutrin [bupropion]   History: Past Medical History:  Diagnosis Date  . Glaucoma, both eyes   . History of left breast cancer 2004--- per pt no recurrence   dx DCIS left breast s/p  total mastectomy w/ reconstruction,  NO chemo or radiation therpy  (ER and PR negative)  . History of Paget's disease of breast   . Insomnia   . PONV (postoperative nausea and vomiting)   . VIN II (vulvar intraepithelial neoplasia II)    Past Surgical History:  Procedure Laterality Date  . CATARACT EXTRACTION W/ INTRAOCULAR LENS IMPLANT  1990 approx.   " left eye I think"  . MASTECTOMY Left   . PARTIAL MASTECTOMY INCORPATING NIPPLE AREOLAR COMPLEX Left 12-28-2002   dr young   [facility]   hx paget's disease left nipple  . RE-EXCISION  PORTION OF THE LUMPECTOMY SITE Left 01-11-2003    dr young  [facility]  . TOTAL MASTECTOMY Left 02-16-2003  dr young [facility]   w/ AXILLARY LYMPH NODE DISSECTION AND IMMEDIATE BREAST RECONSTRUCTION WITH SALINE IMPLANT  . VAGINAL HYSTERECTOMY  1983 approx.  Eugenie Norrie N/A 02/11/2018   Procedure: WIDE EXCISION VULVECTOMY;  Surgeon: Cheri Fowler, MD;  Location: [facility];  Service: Gynecology;  Laterality: N/A;   Family History  Problem Relation Age of Onset  . Heart disease Mother 46       CHF  . Heart disease Father        MI  . Drug abuse  Brother        In [place]; (drugs)  . Heart disease Brother        CAD (valve surg)  . Cancer Sister        lung cancer  . Cancer Brother   . Heart disease Sister        heart failure  . Breast cancer Daughter   . Breast cancer Maternal Grandmother   . Colon cancer Neg Hx    Social History   Socioeconomic History  . Marital status: Divorced    Spouse name: Not on file  . Number of children: 2  . Years of education: Not on file  . Highest education level: Not on file   Occupational History  . Occupation: Development worker, international aid: UNEMPLOYED  Tobacco Use  . Smoking status: Current Every Day Smoker    Packs/day: 0.50    Years: 30.00    Pack years: 15.00    Types: Cigarettes  . Smokeless tobacco: Never Used  Vaping Use  . Vaping Use: Never used  Substance and Sexual Activity  . Alcohol use: Never    Alcohol/week: 0.0 standard drinks  . Drug use: No  . Sexual activity: Not on file  Other Topics Concern  . 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     Social Determinants of Health   Financial Resource Strain: High Risk  . Difficulty of Paying Living Expenses: Very hard  Food Insecurity: No Food Insecurity  . Worried About Charity fundraiser in the Last Year: Never true  . Ran Out of Food in the Last Year: Never true  Transportation Needs: No Transportation Needs  . Lack of Transportation (Medical): No  . Lack of Transportation (Non-Medical): No  Physical Activity: Sufficiently Active  . Days of Exercise per Week: 5 days  . Minutes of Exercise per Session: 60 min  Stress: Stress Concern Present  . Feeling of Stress : Very much  Social Connections: Not on file    Tobacco Counseling Ready to quit: Not Answered Counseling given: Not Answered   Clinical Intake:  Pre-visit preparation completed: Yes  Pain : No/denies pain     Nutritional Risks: None Diabetes: No  How often do you need to have someone help you when you read instructions, pamphlets, or other written materials from your doctor or pharmacy?: 1 - Never  Diabetic: No Nutrition Risk Assessment:  Has the patient had any N/V/D within the last 2 months?  No  Does the patient have any non-healing wounds?  No  Has the patient had any unintentional weight loss or weight gain?  No   Diabetes:  Is the patient diabetic?  No  If  diabetic, was a CBG obtained today?  N/A Did the patient bring in their glucometer from home?  N/A How often do you monitor your CBG's? N/A.   Financial Strains and Diabetes Management:  Are you having any financial strains with the device, your supplies or your medication? N/A.  Does the patient want to be seen by Chronic Care Management for management of their diabetes?  N/A Would the patient like to be referred to a Nutritionist or for Diabetic Management?  N/A   Interpreter Needed?: No  Information entered by :: CJohnson,LPN   Activities of Daily Living In your present state of health,  do you have any difficulty performing the following activities: 12/14/2020  Hearing? N  Vision? N  Difficulty concentrating or making decisions? N  Walking or climbing stairs? N  Dressing or bathing? N  Doing errands, shopping? N  Preparing Food and eating ? N  Using the Toilet? N  In the past six months, have you accidently leaked urine? N  Do you have problems with loss of bowel control? N  Managing your Medications? N  Managing your Finances? N  Housekeeping or managing your Housekeeping? N  Some recent data might be hidden    Patient Care Team: Tonia Ghent, MD as PCP - General (Family Medicine)  Indicate any recent Medical Services you may have received from other than [facility] providers in the past year (date may be approximate).     Assessment:   This is a routine wellness examination for Wendy Pruitt.  Hearing/Vision screen  Hearing Screening   125Hz  250Hz  500Hz  1000Hz  2000Hz  3000Hz  4000Hz  6000Hz  8000Hz   Right ear:           Left ear:           Vision Screening Comments: Patient gets annual eye exams.   Dietary issues and exercise activities discussed: Current Exercise Habits: Home exercise routine, Type of exercise: walking, Time (Minutes): 60, Frequency (Times/Week): 5, Weekly Exercise (Minutes/Week): 300, Intensity: Moderate, Exercise limited by: None identified  Goals    .  Patient Stated     12/14/2020, I will continue to play golf 3-5 times a week. I will walk 5 days a week for about 1 hour.      Depression Screen PHQ 2/9 Scores 12/14/2020  PHQ - 2 Score 0  PHQ- 9 Score 0    Fall Risk Fall Risk  12/14/2020  Falls in the past year? 0  Number falls in past yr: 0  Injury with Fall? 0  Risk for fall due to : No Fall Risks  Follow up Falls evaluation completed;Falls prevention discussed    FALL RISK PREVENTION PERTAINING TO THE HOME:  Any stairs in or around the home? Yes  If so, are there any without handrails? No  Home free of loose throw rugs in walkways, pet beds, electrical cords, etc? Yes  Adequate lighting in your home to reduce risk of falls? Yes   ASSISTIVE DEVICES UTILIZED TO PREVENT FALLS:  Life alert? No  Use of a cane, walker or w/c? No  Grab bars in the bathroom? No  Shower chair or bench in shower? No  Elevated toilet seat or a handicapped toilet? No   TIMED UP AND GO:  Was the test performed? N/A telephone visit.    Cognitive Function: MMSE - Mini Mental State Exam 12/14/2020  Not completed: Unable to complete       Mini Cog  Mini-Cog screen was completed. Maximum score is 22. A value of 0 denotes this part of the MMSE was not completed or the patient failed this part of the Mini-Cog screening.  Immunizations Immunization History  Administered Date(s) Administered  . Influenza Whole 08/19/2020  . Influenza-Unspecified 08/08/2014, 08/06/2015, 08/05/2016, 08/05/2017, 08/05/2018, 08/20/2019  . Moderna Sars-Covid-2 Vaccination 11/16/2020, 12/13/2020  . Td 11/05/1997    TDAP status: Due, Education has been provided regarding the importance of this vaccine. Advised may receive this vaccine at local pharmacy or Health Dept. Aware to provide a copy of the vaccination record if obtained from local pharmacy or Health Dept. Verbalized acceptance and understanding.  Flu Vaccine status: Up to date  Pneumococcal vaccine status: Due,  Education has been provided regarding the importance of this vaccine. Advised may receive this vaccine at local pharmacy or Health Dept. Aware to provide a copy of the vaccination record if obtained from local pharmacy or Health Dept. Verbalized acceptance and understanding.  Covid-19 vaccine status: Completed vaccines  Qualifies for Shingles Vaccine? Yes   Zostavax completed No   Shingrix Completed?: No.    Education has been provided regarding the importance of this vaccine. Patient has been advised to call insurance company to determine out of pocket expense if they have not yet received this vaccine. Advised may also receive vaccine at local pharmacy or Health Dept. Verbalized acceptance and understanding.  Screening Tests Health Maintenance  Topic Date Due  . DEXA SCAN  Never done  . PNA vac Low Risk Adult (1 of 2 - PCV13) Never done  . TETANUS/TDAP  12/15/2023 (Originally 11/06/2007)  . COVID-19 Vaccine (3 - Moderna risk 4-dose series) 01/10/2021  . Fecal DNA (Cologuard)  01/26/2022  . MAMMOGRAM  07/18/2022  . INFLUENZA VACCINE  Completed  . Hepatitis C Screening  Completed    Health Maintenance  Health Maintenance Due  Topic Date Due  . DEXA SCAN  Never done  . PNA vac Low Risk Adult (1 of 2 - PCV13) Never done    Colorectal cancer screening: Type of screening: Cologuard. Completed 01/27/2019. Repeat every 3 years  Mammogram status: Completed 07/18/2020. Repeat every year  Bone Density status: declined  Lung Cancer Screening: (Low Dose CT Chest recommended if Age 56-80 years, 30 pack-year currently smoking OR have quit w/in 15 years.) does not qualify.   Additional Screening:  Hepatitis C Screening: does qualify; Completed 10/12/2016  Vision Screening: Recommended annual ophthalmology exams for early detection of glaucoma and other disorders of the eye. Is the patient up to date with their annual eye exam?  Yes  Who is the provider or what is the name of the office in  which the patient attends annual eye exams? Dr. Katy Fitch If pt is not established with a provider, would they like to be referred to a provider to establish care? No .   Dental Screening: Recommended annual dental exams for proper oral hygiene  Community Resource Referral / Chronic Care Management: CRR required this visit?  No   CCM required this visit?  No      Plan:     I have personally reviewed and noted the following in the patient's chart:   . Medical and social history . Use of alcohol, tobacco or illicit drugs  . Current medications and supplements . Functional ability and status . Nutritional status . Physical activity . Advanced directives . List of other physicians . Hospitalizations, surgeries, and ER visits in previous 12 months . Vitals . Screenings to include cognitive, depression, and falls . Referrals and appointments  In addition, I have reviewed and discussed with patient certain preventive protocols, quality metrics, and best practice recommendations. A written personalized care plan for preventive services as well as general preventive health recommendations were provided to patient.   Due to this being a telephonic visit, the after visit summary with patients personalized plan was offered to patient via office or my-chart. Patient preferred to pick up at office at next visit via mychart.   Andrez Grime, LPN   03/06/7781

## 2020-12-16 ENCOUNTER — Encounter: Payer: Medicare Other | Admitting: Family Medicine

## 2020-12-19 ENCOUNTER — Other Ambulatory Visit: Payer: Self-pay

## 2020-12-19 ENCOUNTER — Encounter: Payer: Self-pay | Admitting: Family Medicine

## 2020-12-19 ENCOUNTER — Ambulatory Visit (INDEPENDENT_AMBULATORY_CARE_PROVIDER_SITE_OTHER): Payer: Medicare Other | Admitting: Family Medicine

## 2020-12-19 VITALS — BP 120/66 | HR 109 | Temp 97.8°F | Ht 69.0 in | Wt 156.0 lb

## 2020-12-19 DIAGNOSIS — Z86718 Personal history of other venous thrombosis and embolism: Secondary | ICD-10-CM | POA: Diagnosis not present

## 2020-12-19 DIAGNOSIS — G4700 Insomnia, unspecified: Secondary | ICD-10-CM

## 2020-12-19 DIAGNOSIS — Z7189 Other specified counseling: Secondary | ICD-10-CM

## 2020-12-19 DIAGNOSIS — Z0000 Encounter for general adult medical examination without abnormal findings: Secondary | ICD-10-CM

## 2020-12-19 DIAGNOSIS — R2689 Other abnormalities of gait and mobility: Secondary | ICD-10-CM

## 2020-12-19 NOTE — Patient Instructions (Signed)
Don't chang your meds for now and update me as needed.  Take care.  Glad to see you. I'll await the neurology notes.
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
## 2020-12-21 DIAGNOSIS — Z0000 Encounter for general adult medical examination without abnormal findings: Secondary | ICD-10-CM | POA: Insufficient documentation

## 2020-12-21 NOTE — Assessment & Plan Note (Signed)
I think it makes sense to see neurology given that she had a reassuring cardiology evaluation.  Previous imaging and labs discussed with patient.  She agrees to plan.

## 2020-12-21 NOTE — Assessment & Plan Note (Signed)
Flu 2021 Shingles discussed with patient.   Defer PNA vaccine given recent covid vaccine, d/w pt.   Tetanus 1999 covid vaccine 2022 Colon cancer screening done with Cologuard 2020. Breast cancer screening 2021 Bone density test discussed with patient.  Encouraged. Pap smear/pelvic exam not due given her age.   Advance directive. Would have her daughter Wendy Pruitt if patient were incapacitated. Smoking d/w pt. Encouraged cessation.

## 2020-12-21 NOTE — Assessment & Plan Note (Signed)
History of DVT.  Completed course of anticoagulation She is off anticoagulation.  D/w pt. she had routine follow-up with hematology in the meantime.  I think it makes sense for her to be off the medication at this point and she agrees.

## 2020-12-21 NOTE — Assessment & Plan Note (Signed)
Insomnia treated with effect from lunesta.  No ADE on med.  Compliant.  She failed tx with other meds.  Continue as is with Lunesta.  She agrees.

## 2020-12-21 NOTE — Assessment & Plan Note (Signed)
Advance directive. Would have her daughter Kim designated if patient were incapacitated.  

## 2020-12-27 ENCOUNTER — Telehealth: Payer: Self-pay | Admitting: Family Medicine

## 2020-12-27 NOTE — Telephone Encounter (Signed)
Called to speak with patient who stated that the PA on her Eszopiclone 2 mg was approved and she received a letter stating that it was approved. However, when she went to the pharmacy to pick up her prescription, they stated that her PA has been denied. She called [facility] who stated that even though she received a letter stating that it was approved, the PA was denied. Patient gave number to call for [facility], which is [phone]. PA re-submitted. Awaiting response.   Vana Nobel Key: Sunnie Nielsen - PA Case ID: [id number] Need help? Call us at [phone] Status Sent to [facility] Eszopiclone 2MG  tablets Form [facility] Medicare Part D Electronic Prior Authorization Form (2017 NCPDP)

## 2020-12-27 NOTE — Telephone Encounter (Signed)
Patient has a question on a refill for one of her prescriptions. There seems to be some confusion and she would like to discuss it with you. EM

## 2020-12-27 NOTE — Telephone Encounter (Signed)
Patient called back again states that she received a call from [place] . EM

## 2020-12-28 ENCOUNTER — Ambulatory Visit: Payer: Medicare Other | Admitting: Dermatology

## 2020-12-28 ENCOUNTER — Encounter: Payer: Self-pay | Admitting: Dermatology

## 2020-12-28 ENCOUNTER — Other Ambulatory Visit: Payer: Self-pay

## 2020-12-28 DIAGNOSIS — L738 Other specified follicular disorders: Secondary | ICD-10-CM

## 2020-12-28 DIAGNOSIS — L309 Dermatitis, unspecified: Secondary | ICD-10-CM | POA: Diagnosis not present

## 2020-12-28 DIAGNOSIS — L821 Other seborrheic keratosis: Secondary | ICD-10-CM

## 2020-12-28 DIAGNOSIS — L720 Epidermal cyst: Secondary | ICD-10-CM

## 2020-12-28 DIAGNOSIS — L578 Other skin changes due to chronic exposure to nonionizing radiation: Secondary | ICD-10-CM

## 2020-12-28 NOTE — Patient Instructions (Signed)
Aklief samples apply at bedtime to affected areas on face, wash off in morning.

## 2020-12-28 NOTE — Progress Notes (Signed)
   Follow-Up Visit   Subjective  Wendy Pruitt is a 65 y.o. female who presents for the following: Follow-up (8 week recheck right upper eyelid. Hx of ISK Tx with LN2. Patient thinks has resolved. /Recheck milia at right lateral eyelid. Unsure if wants extracted today. ) and Rash (Right lower eyelid. Dur: several days. Has been using samples of Cloderm, helps. Uses eyedrops daily for glaucoma).  The following portions of the chart were reviewed this encounter and updated as appropriate:  Tobacco  Allergies  Meds  Problems  Med Hx  Surg Hx  Fam Hx     Review of Systems: No other skin or systemic complaints except as noted in HPI or Assessment and Plan.  Objective  Well appearing patient in no apparent distress; mood and affect are within normal limits.  A focused examination was performed including head, including the scalp, face, neck, nose, ears, eyelids, and lips. Relevant physical exam findings are noted in the Assessment and Plan.  Objective  Right Lateral Canthus and right lower eyelid at rim of eyelashes: Smooth white papules.  Objective  Right Lower Eyelid at rim of lashes: Yellowish papules  Objective  Right Lower Eyelid: Eyelid dermatitis, secondary to eye drops for glaucoma,  Scaly erythematous papules and plaques +/- edema and vesiculation.   Assessment & Plan  Milia Right Lateral Canthus and right lower eyelid at rim of eyelashes Aklief samples given to be applied QHS to affected areas -only use on the temple/lateral canthal area.  Do not use on the lower eyelid that it is too close to the eye and too irritating in that area. Advised the Arlyn Leak will make the skin peeling get red and irritated.  She understands.  Sebaceous hyperplasia with milium Right Lower Eyelid at rim of lashes Benign, observe.   Avoid using Aklief to this area.  Dermatitis Right Lower Eyelid Chronic; persistent and recurrent.   Use Cloderm cream for severe flares. Use Elidel cream  BID.  Seborrheic Keratoses - Stuck-on, waxy, tan-brown papules and plaques  - Discussed benign etiology and prognosis. - Observe - Call for any changes  Actinic Damage - chronic, secondary to cumulative UV radiation exposure/sun exposure over time - diffuse scaly erythematous macules with underlying dyspigmentation - Recommend daily broad spectrum sunscreen SPF 30+ to sun-exposed areas, reapply every 2 hours as needed.  - Call for new or changing lesions.  Return if symptoms worsen or fail to improve.   I, Emelia Salisbury, CMA, am acting as scribe for Sarina Ser, MD.  Documentation: I have reviewed the above documentation for accuracy and completeness, and I agree with the above.  Sarina Ser, MD

## 2020-12-28 NOTE — Telephone Encounter (Signed)
PA approved through 11/04/2021. 

## 2020-12-31 ENCOUNTER — Encounter: Payer: Self-pay | Admitting: Dermatology

## 2021-01-09 ENCOUNTER — Ambulatory Visit: Payer: Medicare Other | Admitting: Cardiovascular Disease

## 2021-01-17 NOTE — Progress Notes (Addendum)
NEUROLOGY CONSULTATION NOTE  Wendy Pruitt MRN: [id number] DOB: 03-09-1956  Referring provider: Elveria Rising. Damita Dunnings, MD Primary care provider: Elveria Rising. Damita Dunnings, MD  Reason for consult:  dizziness  Assessment/Plan:   1.  Transient episode of dizziness - non-focal symptoms - she endorsed that both feet were numb and couldn't move them for a few minutes - uncertain if this was due to just feeling dizzy.  Given bilateral symptoms, not clearly symptoms of stroke.  It may have been related to dehydration.   2.  Tobacco use disorder  1.  Given comorbidities, agree with ASA 81mg  daily 2.  Will check CTA of head to evaluate for vertebrobasilar insufficiency 3.  Smoking cessation 4.  Further recommendations pending results.  ADDENDUM:  CTA of head on 01/26/2021 normal.  I do not think this was a cerebrovascular event or primary neurologic event.  Again, I would still continue ASA 81mg  daily given her comorbidities.  Follow up as needed. Metta Clines, DO  Subjective:  Wendy Pruitt is a 65 year old right-handed female with glaucoma, tobacco abuse, aortic atherosclerosis and history of breast cancer and DVT who presents for dizziness.  History supplemented by referring provider's note.  She had developed a DVT in December 2021 and was started on Xarelto for 6 months.  She followed up with hematology in January 2022.  D-dimer was normal and DVT was believed to have dissolved.  She received the Moderna shot on 11/13/2020.  On 11/28/2020, she was standing at a restaurant with her coworkers when she suddenly felt dizzy, described as lightheadedness but not spinning. Her feet also felt numb and she was unable to lift them off the ground.  She had to shuffle and lean against the wall until symptoms subsided.  The difficulty walking lasted about 15 seconds.  The dizziness lasted for about a minute.  No headache, visual disturbance, slurred speech, or focal numbness or weakness.  She reports that she eats and  drinks very little and hadn't been keeping hydrated and didn't eat much at lunch.  She is a smoker.  She saw primary care the following day and had a CT head without contrast, personally reviewed, which was normal.  She followed up with her cardiologist.  Carotid ultrasound showed no hemodynamically significant ICA stenosis and showed antegrade flow of both vertebral arteries.  EKG showed right BBB and left anterior fascicular block but nothing acute.  Echocardiogram and CTA of chest from December 2020 were unremarkable.  Lipid panel from February 2021 was normal.  She was started on ASA 81mg  daily.  She has started increasing her water intake.  She has not had a recurrence.  She says that in November 2021, she was standing and talking with her grandson when she suddenly had blurred vision in both eyes.  It lasted for a few seconds.  No associated headache.  She has glaucoma in her right eye and followed up with her ophthalmologist the following day but exam showed no new findings.    PAST MEDICAL HISTORY: Past Medical History:  Diagnosis Date  . Glaucoma, both eyes   . History of left breast cancer 2004--- per pt no recurrence   dx DCIS left breast s/p  total mastectomy w/ reconstruction,  NO chemo or radiation therpy  (ER and PR negative)  . History of Paget's disease of breast   . Insomnia   . PONV (postoperative nausea and vomiting)   . VIN II (vulvar intraepithelial neoplasia II)  PAST SURGICAL HISTORY: Past Surgical History:  Procedure Laterality Date  . CATARACT EXTRACTION W/ INTRAOCULAR LENS IMPLANT  1990 approx.   " left eye I think"  . MASTECTOMY Left   . PARTIAL MASTECTOMY INCORPATING NIPPLE AREOLAR COMPLEX Left 12-28-2002   dr young   [facility]   hx paget's disease left nipple  . RE-EXCISION  PORTION OF THE LUMPECTOMY SITE Left 01-11-2003    dr young  [facility]  . TOTAL MASTECTOMY Left 02-16-2003  dr young [facility]   w/ AXILLARY LYMPH NODE DISSECTION AND IMMEDIATE BREAST RECONSTRUCTION WITH  SALINE IMPLANT  . VAGINAL HYSTERECTOMY  1983 approx.  Eugenie Norrie N/A 02/11/2018   Procedure: WIDE EXCISION VULVECTOMY;  Surgeon: Cheri Fowler, MD;  Location: [facility];  Service: Gynecology;  Laterality: N/A;    MEDICATIONS: Current Outpatient Medications on File Prior to Visit  Medication Sig Dispense Refill  . aspirin EC 81 MG tablet Take 1 tablet (81 mg total) by mouth daily. Swallow whole. 90 tablet 3  . dorzolamide-timolol (COSOPT) 22.3-6.8 MG/ML ophthalmic solution Place 1 drop into the right eye 2 (two) times daily.    . eszopiclone (LUNESTA) 2 MG TABS tablet TAKE 1/2 TO 1 TABLET BY MOUTH AT BEDTIME USE SPARINGLY AS NEEDED. INTOLERANT OF TRAZODONE. 30 tablet 5  . latanoprost (XALATAN) 0.005 % ophthalmic solution Place 1 drop into both eyes at bedtime.     . pimecrolimus (ELIDEL) 1 % cream Apply 1 application topically 2 (two) times daily. 30 g 2   No current facility-administered medications on file prior to visit.    ALLERGIES: Allergies  Allergen Reactions  . Chantix [Varenicline] Other (See Comments)    Intolerant- abnormal dreams.    . Trazodone And Nefazodone Other (See Comments)    Nasal congestion  . Wellbutrin [Bupropion] Other (See Comments)    Intolerant.      FAMILY HISTORY: Family History  Problem Relation Age of Onset  . Heart disease Mother 58       CHF  . Heart disease Father        MI  . Drug abuse Brother        In [place]; (drugs)  . Heart disease Brother        CAD (valve surg)  . Cancer Sister        lung cancer  . Cancer Brother   . Heart disease Sister        heart failure  . Breast cancer Daughter   . Breast cancer Maternal Grandmother   . Colon cancer Neg Hx     Objective:  Blood pressure 110/65, pulse 85, height 5\' 8"  (1.727 m), weight 159 lb 12.8 oz (72.5 kg), SpO2 98 %. General: No acute distress.  Patient appears well-groomed.   Head:  Normocephalic/atraumatic Eyes:  fundi examined but not visualized Neck:  supple, no paraspinal tenderness, full range of motion Back: No paraspinal tenderness Heart: regular rate and rhythm Lungs: Clear to auscultation bilaterally. Vascular: No carotid bruits. Neurological Exam: Mental status: alert and oriented to person, place, and time, recent and remote memory intact, fund of knowledge intact, attention and concentration intact, speech fluent and not dysarthric, language intact. Cranial nerves: CN I: not tested CN II: pupils equal, round and reactive to light, visual fields intact CN III, IV, VI:  full range of motion, no nystagmus, no ptosis CN V: facial sensation intact. CN VII: upper and lower face symmetric CN VIII: hearing intact CN IX, X: gag intact, uvula midline CN XI:  sternocleidomastoid and trapezius muscles intact CN XII: tongue midline Bulk & Tone: normal, no fasciculations. Motor:  muscle strength 5/5 throughout Sensation:  Pinprick, temperature and vibratory sensation intact. Deep Tendon Reflexes:  2+ throughout,  toes downgoing.   Finger to nose testing:  Without dysmetria.   Heel to shin:  Without dysmetria.   Gait:  Normal station and stride.  Romberg negative.    Thank you for allowing me to take part in the care of this patient.  Metta Clines, DO  CC:  Elveria Rising. Damita Dunnings, MD

## 2021-01-19 ENCOUNTER — Encounter: Payer: Self-pay | Admitting: Neurology

## 2021-01-19 ENCOUNTER — Ambulatory Visit: Payer: Medicare Other | Admitting: Neurology

## 2021-01-19 ENCOUNTER — Other Ambulatory Visit: Payer: Self-pay

## 2021-01-19 VITALS — BP 110/65 | HR 85 | Ht 68.0 in | Wt 159.8 lb

## 2021-01-19 DIAGNOSIS — R42 Dizziness and giddiness: Secondary | ICD-10-CM | POA: Diagnosis not present

## 2021-01-19 DIAGNOSIS — F17200 Nicotine dependence, unspecified, uncomplicated: Secondary | ICD-10-CM

## 2021-01-19 DIAGNOSIS — G450 Vertebro-basilar artery syndrome: Secondary | ICD-10-CM

## 2021-01-19 NOTE — Patient Instructions (Signed)
May have been related to dehydration But I want to check blood vessels in your brain - we will check CTA of head Agree with taking aspirin 81mg  daily Further recommendations pending results.

## 2021-01-26 ENCOUNTER — Ambulatory Visit
Admission: RE | Admit: 2021-01-26 | Discharge: 2021-01-26 | Disposition: A | Discharge status: Home / Self Care | Payer: Medicare Other | Source: Ambulatory Visit | Attending: Neurology | Admitting: Neurology

## 2021-01-26 DIAGNOSIS — G450 Vertebro-basilar artery syndrome: Secondary | ICD-10-CM

## 2021-01-26 MED ORDER — IOPAMIDOL (ISOVUE-370) INJECTION 76%
75.0000 mL | Freq: Once | INTRAVENOUS | Status: AC | PRN
  Administered 2021-01-26: 75 mL via INTRAVENOUS

## 2021-05-18 ENCOUNTER — Other Ambulatory Visit: Payer: Self-pay | Admitting: Family Medicine

## 2021-05-18 NOTE — Telephone Encounter (Signed)
Correction last refill: 04/20/2021.

## 2021-05-18 NOTE — Telephone Encounter (Signed)
Pt requesting Lunesta 2 mg tab LOV: 12/19/2020 Next Visit: 12/15/2021 Last refill: 6/106/2022  Approve?

## 2021-05-19 NOTE — Telephone Encounter (Signed)
Sent. Thanks.   

## 2021-06-01 ENCOUNTER — Other Ambulatory Visit: Payer: Self-pay | Admitting: Family Medicine

## 2021-06-01 DIAGNOSIS — Z1231 Encounter for screening mammogram for malignant neoplasm of breast: Secondary | ICD-10-CM

## 2021-06-12 ENCOUNTER — Encounter: Payer: Self-pay | Admitting: Dermatology

## 2021-06-12 ENCOUNTER — Ambulatory Visit: Payer: Medicare Other | Admitting: Dermatology

## 2021-06-12 ENCOUNTER — Other Ambulatory Visit: Payer: Self-pay

## 2021-06-12 DIAGNOSIS — L814 Other melanin hyperpigmentation: Secondary | ICD-10-CM | POA: Diagnosis not present

## 2021-06-12 DIAGNOSIS — L821 Other seborrheic keratosis: Secondary | ICD-10-CM

## 2021-06-12 DIAGNOSIS — Z1283 Encounter for screening for malignant neoplasm of skin: Secondary | ICD-10-CM | POA: Diagnosis not present

## 2021-06-12 DIAGNOSIS — D1800 Hemangioma unspecified site: Secondary | ICD-10-CM

## 2021-06-12 DIAGNOSIS — L239 Allergic contact dermatitis, unspecified cause: Secondary | ICD-10-CM

## 2021-06-12 DIAGNOSIS — L578 Other skin changes due to chronic exposure to nonionizing radiation: Secondary | ICD-10-CM

## 2021-06-12 DIAGNOSIS — D229 Melanocytic nevi, unspecified: Secondary | ICD-10-CM

## 2021-06-12 MED ORDER — DESONIDE 0.05 % EX CREA: TOPICAL_CREAM | Freq: Every day | CUTANEOUS | 1 refills | Status: DC

## 2021-06-12 NOTE — Progress Notes (Signed)
   Follow-Up Visit   Subjective  Wendy Pruitt is a 65 y.o. female who presents for the following: Annual Exam (No history of skin cancer - TBSE today) and Rash (Around right eye that seems to be coming from her glaucoma drops. She is treating with Elidel and Cloderm). The patient presents for Total-Body Skin Exam (TBSE) for skin cancer screening and mole check.  The following portions of the chart were reviewed this encounter and updated as appropriate:   Tobacco  Allergies  Meds  Problems  Med Hx  Surg Hx  Fam Hx     Review of Systems:  No other skin or systemic complaints except as noted in HPI or Assessment and Plan.  Objective  Well appearing patient in no apparent distress; mood and affect are within normal limits.  A full examination was performed including scalp, head, eyes, ears, nose, lips, neck, chest, axillae, abdomen, back, buttocks, bilateral upper extremities, bilateral lower extremities, hands, feet, fingers, toes, fingernails, and toenails. All findings within normal limits unless otherwise noted below.  Right Upper Eyelid Pinkness of right medial infraorbital area   Assessment & Plan   Lentigines - Scattered tan macules - Due to sun exposure - Benign-appering, observe - Recommend daily broad spectrum sunscreen SPF 30+ to sun-exposed areas, reapply every 2 hours as needed. - Call for any changes  Seborrheic Keratoses - Stuck-on, waxy, tan-brown papules and/or plaques  - Benign-appearing - Discussed benign etiology and prognosis. - Observe - Call for any changes  Melanocytic Nevi - Tan-brown and/or pink-flesh-colored symmetric macules and papules - Benign appearing on exam today - Observation - Call clinic for new or changing moles - Recommend daily use of broad spectrum spf 30+ sunscreen to sun-exposed areas.   Hemangiomas - Red papules - Discussed benign nature - Observe - Call for any changes  Actinic Damage - Chronic condition, secondary  to cumulative UV/sun exposure - diffuse scaly erythematous macules with underlying dyspigmentation - Recommend daily broad spectrum sunscreen SPF 30+ to sun-exposed areas, reapply every 2 hours as needed.  - Staying in the shade or wearing long sleeves, sun glasses (UVA+UVB protection) and wide brim hats (4-inch brim around the entire circumference of the hat) are also recommended for sun protection.  - Call for new or changing lesions.  Skin cancer screening performed today.  Allergic dermatitis Eyelids Contact Dermatitis secondary to glaucoma drops - chronic and persistent Start Desonide cream qd prn flares.  Continue Elidel cream qd prn. Advised to use Elidel to control.  If needed, can use Desonide or Cloderm for rescue when Elidel not adequate.  desonide (DESOWEN) 0.05 % cream - Right Upper Eyelid Apply topically daily. As needed for flares  Skin cancer screening  Return in about 1 year (around 06/12/2022) for TBSE.  I, Ashok Cordia, CMA, am acting as scribe for Sarina Ser, MD . Documentation: I have reviewed the above documentation for accuracy and completeness, and I agree with the above.  Sarina Ser, MD

## 2021-06-12 NOTE — Patient Instructions (Signed)

## 2021-07-21 ENCOUNTER — Ambulatory Visit
Admission: RE | Admit: 2021-07-21 | Discharge: 2021-07-21 | Disposition: A | Discharge status: Home / Self Care | Payer: Medicare Other | Source: Ambulatory Visit | Attending: Family Medicine | Admitting: Family Medicine

## 2021-07-21 ENCOUNTER — Other Ambulatory Visit: Payer: Self-pay

## 2021-07-21 DIAGNOSIS — Z1231 Encounter for screening mammogram for malignant neoplasm of breast: Secondary | ICD-10-CM

## 2021-11-05 HISTORY — PX: REMOVE AND REPLACE LENS: SHX6308

## 2021-11-17 ENCOUNTER — Telehealth: Payer: Self-pay | Admitting: Family Medicine

## 2021-11-17 NOTE — Telephone Encounter (Signed)
Refill request for ESZOPICLONE 2 MG TABLET  LOV - 12/19/20 Next OV - 12/18/21 Last refill - 05/19/21 #30/5

## 2021-12-05 NOTE — Telephone Encounter (Signed)
Pt called to follow up on refill request  °

## 2021-12-05 NOTE — Telephone Encounter (Signed)
Called patient and notified her that the refill was sent in for her on 11/19/21. She stated that she was not calling about her refill request but that the refill required a PA to be done. She had to pay for her medication in order to pick it up this month. I apologized to patient as I had not received any notice about this medication needing a PA. I advised patient will will work on getting a PA done tomorrow so it will hopefully be okay for next refill next month.

## 2021-12-06 NOTE — Telephone Encounter (Signed)
PA has been started in [email or web address] for eszopiclone 2 mg tablets. Key: [id number]; awaiting determination.

## 2021-12-07 NOTE — Telephone Encounter (Signed)
PA was denied. Patient has to try Quviviq before eszopiclone will be covered.

## 2021-12-08 MED ORDER — QUVIVIQ 25 MG PO TABS: 25.0000 mg | ORAL_TABLET | Freq: Every day | ORAL | Status: DC

## 2021-12-08 MED ORDER — ESZOPICLONE 2 MG PO TABS: ORAL_TABLET | ORAL | Status: DC

## 2021-12-08 NOTE — Telephone Encounter (Addendum)
Noted. Thanks.  Per Jessica's report, the lunesta rx if available at [facility] for ~$3 per month.

## 2021-12-08 NOTE — Telephone Encounter (Signed)
Spoke with patient and she could not fully talk to me when I called her and will back to discuss.

## 2021-12-08 NOTE — Telephone Encounter (Signed)
Spoke with patient. She does not want to change meds nor does she want to have to pay for this out of pocket every month. The denial I received did not give me an appeal option but patient states she is going to call the insurance herself and will bring PPW with her to her visit on 12/18/21 to appeal this.

## 2021-12-08 NOTE — Addendum Note (Signed)
Addended by: Tonia Ghent on: 12/08/2021 08:21 AM   Modules accepted: Orders

## 2021-12-08 NOTE — Telephone Encounter (Signed)
She can try Quviviq.  The other option is to pay for lunesta.  Let me know which way she wants to go.  Thanks.

## 2021-12-10 ENCOUNTER — Other Ambulatory Visit: Payer: Self-pay | Admitting: Family Medicine

## 2021-12-10 DIAGNOSIS — Z86718 Personal history of other venous thrombosis and embolism: Secondary | ICD-10-CM

## 2021-12-10 DIAGNOSIS — E785 Hyperlipidemia, unspecified: Secondary | ICD-10-CM

## 2021-12-11 ENCOUNTER — Other Ambulatory Visit: Payer: Medicare Other

## 2021-12-12 NOTE — Progress Notes (Signed)
Subjective:   Wendy Pruitt is a 66 y.o. female who presents for Medicare Annual (Subsequent) preventive examination.  I connected with Laurey Morale today by telephone and verified that I am speaking with the correct person using two identifiers. Location patient: home Location provider: work Persons participating in the virtual visit: patient, Marine scientist.    I discussed the limitations, risks, security and privacy concerns of performing an evaluation and management service by telephone and the availability of in person appointments. I also discussed with the patient that there may be a patient responsible charge related to this service. The patient expressed understanding and verbally consented to this telephonic visit.    Interactive audio and video telecommunications were attempted between this provider and patient, however failed, due to patient having technical difficulties OR patient did not have access to video capability.  We continued and completed visit with audio only.  Some vital signs may be absent or patient reported.   Time Spent with patient on telephone encounter: 20 minutes  Review of Systems     Cardiac Risk Factors include: advanced age (>58men, >61 women);dyslipidemia     Objective:    Today's Vitals   12/15/21 1400  Weight: 164 lb (74.4 kg)  Height: 5\' 8"  (1.727 m)   Body mass index is 24.94 kg/m.  Advanced Directives 12/15/2021 01/19/2021 12/14/2020 11/07/2020 04/29/2020 10/23/2019 02/11/2018  Does Patient Have a Medical Advance Directive? No No No No No No No  Does patient want to make changes to medical advance directive? Yes (MAU/Ambulatory/Procedural Areas - Information given) - - - - - -  Would patient like information on creating a medical advance directive? - - No - Patient declined - No - Patient declined No - Patient declined No - Patient declined    Current Medications (verified) Outpatient Encounter Medications as of 12/15/2021  Medication Sig    aspirin EC 81 MG tablet Take 1 tablet (81 mg total) by mouth daily. Swallow whole.   desonide (DESOWEN) 0.05 % cream Apply topically daily. As needed for flares   dorzolamide-timolol (COSOPT) 22.3-6.8 MG/ML ophthalmic solution Place 1 drop into the right eye 2 (two) times daily.   eszopiclone (LUNESTA) 2 MG TABS tablet Take immediately before bedtime   latanoprost (XALATAN) 0.005 % ophthalmic solution Place 1 drop into both eyes at bedtime.    pimecrolimus (ELIDEL) 1 % cream Apply 1 application topically 2 (two) times daily. (Patient not taking: Reported on 12/15/2021)   No facility-administered encounter medications on file as of 12/15/2021.    Allergies (verified) Chantix [varenicline], Trazodone and nefazodone, and Wellbutrin [bupropion]   History: Past Medical History:  Diagnosis Date   Glaucoma, both eyes    History of left breast cancer 2004--- per pt no recurrence   dx DCIS left breast s/p  total mastectomy w/ reconstruction,  NO chemo or radiation therpy  (ER and PR negative)   History of Paget's disease of breast    Insomnia    PONV (postoperative nausea and vomiting)    VIN II (vulvar intraepithelial neoplasia II)    Past Surgical History:  Procedure Laterality Date   CATARACT EXTRACTION W/ INTRAOCULAR LENS IMPLANT  1990 approx.   " left eye I think"   MASTECTOMY Left    PARTIAL MASTECTOMY INCORPATING NIPPLE AREOLAR COMPLEX Left 12-28-2002   dr young   [facility]   hx paget's disease left nipple   RE-EXCISION  PORTION OF THE LUMPECTOMY SITE Left 01-11-2003    dr young  [facility]  TOTAL MASTECTOMY Left 02-16-2003  dr [facility]   w/ AXILLARY LYMPH NODE DISSECTION AND IMMEDIATE BREAST RECONSTRUCTION WITH SALINE IMPLANT   VAGINAL HYSTERECTOMY  1983 approx.   VULVECTOMY N/A 02/11/2018   Procedure: WIDE EXCISION VULVECTOMY;  Surgeon: Cheri Fowler, MD;  Location: [facility];  Service: Gynecology;  Laterality: N/A;   Family History  Problem Relation Age of Onset    Heart disease Mother 58       CHF   Heart disease Father        MI   Drug abuse Brother        In [place]; (drugs)   Heart disease Brother        CAD (valve surg)   Cancer Sister        lung cancer   Cancer Brother    Heart disease Sister        heart failure   Breast cancer Daughter    Breast cancer Maternal Grandmother    Colon cancer Neg Hx    Social History   Socioeconomic History   Marital status: Divorced    Spouse name: Not on file   Number of children: 2   Years of education: Not on file   Highest education level: Not on file  Occupational History   Occupation: Development worker, international aid: UNEMPLOYED  Tobacco Use   Smoking status: Every Day    Packs/day: 0.50    Years: 30.00    Pack years: 15.00    Types: Cigarettes   Smokeless tobacco: Never  Vaping Use   Vaping Use: Never used  Substance and Sexual Activity   Alcohol use: Never    Alcohol/week: 0.0 standard drinks   Drug use: No   Sexual activity: Not on file  Other Topics Concern   Not on file  Social History Narrative   Divorced   2 kids local   As of 2021 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     Social Determinants of Health   Financial Resource Strain: Low Risk    Difficulty of Paying Living Expenses: Not hard at all  Food Insecurity: No Food Insecurity   Worried About Charity fundraiser in the Last Year: Never true   [place] in the Last Year: Never true  Transportation Needs: No Transportation Needs   Lack of Transportation (Medical): No   Lack of Transportation (Non-Medical): No  Physical Activity: Sufficiently Active   Days of Exercise per Week: 5 days   Minutes of Exercise per Session: 30 min  Stress: No Stress Concern Present   Feeling of Stress : Not at all  Social Connections: Moderately Isolated   Frequency of Communication with Friends and Family: More than three times a week   Frequency of Social  Gatherings with Friends and Family: More than three times a week   Attends Religious Services: More than 4 times per year   Active Member of [employer] or Organizations: No   Attends Music therapist: Never   Marital Status: Divorced    Tobacco Counseling Ready to quit: Not Answered Counseling given: Not Answered   Clinical Intake:  Pre-visit preparation completed: Yes  Pain : No/denies pain     BMI - recorded: 24.94 Nutritional Status: BMI of 19-24  Normal Nutritional Risks: None Diabetes: No  How often do you need to have someone help you when you read instructions, pamphlets, or other written  materials from your doctor or pharmacy?: 1 - Never  Diabetic? No     Information entered by :: Orrin Brigham LPN   Activities of Daily Living In your present state of health, do you have any difficulty performing the following activities: 12/15/2021  Hearing? N  Vision? N  Difficulty concentrating or making decisions? N  Walking or climbing stairs? N  Dressing or bathing? N  Doing errands, shopping? N  Preparing Food and eating ? N  Using the Toilet? N  In the past six months, have you accidently leaked urine? N  Do you have problems with loss of bowel control? N  Managing your Medications? N  Managing your Finances? N  Housekeeping or managing your Housekeeping? N  Some recent data might be hidden    Patient Care Team: Tonia Ghent, MD as PCP - General (Family Medicine)  Indicate any recent Medical Services you may have received from other than [facility] providers in the past year (date may be approximate).     Assessment:   This is a routine wellness examination for Malala.  Hearing/Vision screen Hearing Screening - Comments:: No issues Vision Screening - Comments:: Last exam 09/2021, Dr Katy Fitch   Dietary issues and exercise activities discussed: Current Exercise Habits: Home exercise routine, Type of exercise: walking;yoga;stretching (plays golf), Time  (Minutes): 30 (30-36min), Frequency (Times/Week): 5, Weekly Exercise (Minutes/Week): 150, Intensity: Moderate   Goals Addressed             This Visit's Progress    Patient Stated       Would like to continue to work on drinking more, water and exercise.        Depression Screen PHQ 2/9 Scores 12/15/2021 12/14/2020  PHQ - 2 Score 0 0  PHQ- 9 Score - 0    Fall Risk Fall Risk  12/15/2021 01/19/2021 12/14/2020  Falls in the past year? 0 0 0  Number falls in past yr: 0 0 0  Injury with Fall? 0 0 0  Risk for fall due to : No Fall Risks - No Fall Risks  Follow up Falls prevention discussed - Falls evaluation completed;Falls prevention discussed    FALL RISK PREVENTION PERTAINING TO THE HOME:  Any stairs in or around the home? Yes  If so, are there any without handrails? Yes  Home free of loose throw rugs in walkways, pet beds, electrical cords, etc? Yes  Adequate lighting in your home to reduce risk of falls? Yes   ASSISTIVE DEVICES UTILIZED TO PREVENT FALLS:  Life alert? No  Use of a cane, walker or w/c? No  Grab bars in the bathroom? No  Shower chair or bench in shower? No  Elevated toilet seat or a handicapped toilet? Yes   TIMED UP AND GO:  Was the test performed? No .    Cognitive Function: Normal cognitive status assessed by this Nurse Health Advisor. No abnormalities found.   MMSE - Mini Mental State Exam 12/14/2020  Not completed: Unable to complete        Immunizations Immunization History  Administered Date(s) Administered   Influenza Whole 08/19/2020   Influenza-Unspecified 08/08/2014, 08/06/2015, 08/05/2016, 08/05/2017, 08/05/2018, 08/20/2019, 08/05/2021   Moderna Sars-Covid-2 Vaccination 11/16/2020, 12/14/2020   Td 11/05/1997    TDAP status: Due, Education has been provided regarding the importance of this vaccine. Advised may receive this vaccine at local pharmacy or Health Dept. Aware to provide a copy of the vaccination record if obtained from local  pharmacy or Health Dept.  Verbalized acceptance and understanding.  Flu Vaccine status: Up to date  Pneumococcal vaccine status: Up to date  Covid-19 vaccine status: Information provided on how to obtain vaccines.   Qualifies for Shingles Vaccine? Yes   Zostavax completed No   Shingrix Completed?: No.    Education has been provided regarding the importance of this vaccine. Patient has been advised to call insurance company to determine out of pocket expense if they have not yet received this vaccine. Advised may also receive vaccine at local pharmacy or Health Dept. Verbalized acceptance and understanding.  Screening Tests Health Maintenance  Topic Date Due   Pneumonia Vaccine 27+ Years old (1 - PCV) Never done   Zoster Vaccines- Shingrix (1 of 2) Never done   DEXA SCAN  Never done   COVID-19 Vaccine (3 - Moderna risk series) 01/11/2021   TETANUS/TDAP  12/15/2023 (Originally 11/06/2007)   Fecal DNA (Cologuard)  01/26/2022   MAMMOGRAM  07/22/2023   INFLUENZA VACCINE  Completed   Hepatitis C Screening  Completed   HPV VACCINES  Aged Out    Health Maintenance  Health Maintenance Due  Topic Date Due   Pneumonia Vaccine 25+ Years old (1 - PCV) Never done   Zoster Vaccines- Shingrix (1 of 2) Never done   DEXA SCAN  Never done   COVID-19 Vaccine (3 - Moderna risk series) 01/11/2021    Colorectal cancer screening: Type of screening: Cologuard. Completed 01/27/19. Repeat every 3 years  Mammogram status: Completed 07/21/21. Repeat every year  Bone Density status: Patient declined today, will discuss with PCP when decided   Lung Cancer Screening: (Low Dose CT Chest recommended if Age 49-80 years, 30 pack-year currently smoking OR have quit w/in 15years.) does not qualify.     Additional Screening:  Hepatitis C Screening: does qualify; Completed 10/12/16  Vision Screening: Recommended annual ophthalmology exams for early detection of glaucoma and other disorders of the eye. Is the  patient up to date with their annual eye exam?  Yes  Who is the provider or what is the name of the office in which the patient attends annual eye exams? Dr. Katy Fitch  Dental Screening: Recommended annual dental exams for proper oral hygiene  Community Resource Referral / Chronic Care Management: CRR required this visit?  No   CCM required this visit?  No      Plan:     I have personally reviewed and noted the following in the patients chart:   Medical and social history Use of alcohol, tobacco or illicit drugs  Current medications and supplements including opioid prescriptions.  Functional ability and status Nutritional status Physical activity Advanced directives List of other physicians Hospitalizations, surgeries, and ER visits in previous 12 months Vitals Screenings to include cognitive, depression, and falls Referrals and appointments  In addition, I have reviewed and discussed with patient certain preventive protocols, quality metrics, and best practice recommendations. A written personalized care plan for preventive services as well as general preventive health recommendations were provided to patient.   Due to this being a telephonic visit, the after visit summary with patients personalized plan was offered to patient via mail or my-chart. Patient would like to access on my-chart.    Loma Messing, LPN   8/88/2800   Nurse Health Advisor  Nurse Notes: none

## 2021-12-15 ENCOUNTER — Ambulatory Visit (INDEPENDENT_AMBULATORY_CARE_PROVIDER_SITE_OTHER): Payer: Medicare Other

## 2021-12-15 ENCOUNTER — Other Ambulatory Visit (INDEPENDENT_AMBULATORY_CARE_PROVIDER_SITE_OTHER): Payer: Medicare Other

## 2021-12-15 ENCOUNTER — Other Ambulatory Visit: Payer: Self-pay

## 2021-12-15 VITALS — Ht 68.0 in | Wt 164.0 lb

## 2021-12-15 DIAGNOSIS — Z1211 Encounter for screening for malignant neoplasm of colon: Secondary | ICD-10-CM | POA: Diagnosis not present

## 2021-12-15 DIAGNOSIS — Z0000 Encounter for general adult medical examination without abnormal findings: Secondary | ICD-10-CM

## 2021-12-15 DIAGNOSIS — E785 Hyperlipidemia, unspecified: Secondary | ICD-10-CM

## 2021-12-15 DIAGNOSIS — Z86718 Personal history of other venous thrombosis and embolism: Secondary | ICD-10-CM | POA: Diagnosis not present

## 2021-12-15 LAB — CBC WITH DIFFERENTIAL/PLATELET
Basophils Absolute: 0 10*3/uL (ref 0.0–0.1)
Basophils Relative: 0.4 % (ref 0.0–3.0)
Eosinophils Absolute: 0.3 10*3/uL (ref 0.0–0.7)
Eosinophils Relative: 4.8 % (ref 0.0–5.0)
HCT: 39.7 % (ref 36.0–46.0)
Hemoglobin: 13.3 g/dL (ref 12.0–15.0)
Lymphocytes Relative: 34.9 % (ref 12.0–46.0)
Lymphs Abs: 2.2 10*3/uL (ref 0.7–4.0)
MCHC: 33.6 g/dL (ref 30.0–36.0)
MCV: 90.6 fl (ref 78.0–100.0)
Monocytes Absolute: 0.7 10*3/uL (ref 0.1–1.0)
Monocytes Relative: 11.6 % (ref 3.0–12.0)
Neutro Abs: 3 10*3/uL (ref 1.4–7.7)
Neutrophils Relative %: 48.3 % (ref 43.0–77.0)
Platelets: 332 10*3/uL (ref 150.0–400.0)
RBC: 4.38 Mil/uL (ref 3.87–5.11)
RDW: 13.7 % (ref 11.5–15.5)
WBC: 6.3 10*3/uL (ref 4.0–10.5)

## 2021-12-15 LAB — LIPID PANEL
Cholesterol: 142 mg/dL (ref 0–200)
HDL: 44.8 mg/dL (ref >39.00)
LDL Cholesterol: 82 mg/dL (ref 0–99)
NonHDL: 97.51
Total CHOL/HDL Ratio: 3
Triglycerides: 77 mg/dL (ref 0.0–149.0)
VLDL: 15.4 mg/dL (ref 0.0–40.0)

## 2021-12-15 LAB — COMPREHENSIVE METABOLIC PANEL
ALT: 9 U/L (ref 0–35)
AST: 21 U/L (ref 0–37)
Albumin: 3.7 g/dL (ref 3.5–5.2)
Alkaline Phosphatase: 85 U/L (ref 39–117)
BUN: 19 mg/dL (ref 6–23)
CO2: 34 mEq/L — ABNORMAL HIGH (ref 19–32)
Calcium: 9.1 mg/dL (ref 8.4–10.5)
Chloride: 106 mEq/L (ref 96–112)
Creatinine, Ser: 0.85 mg/dL (ref 0.40–1.20)
GFR: 70.82 mL/min (ref >60.00)
Glucose, Bld: 94 mg/dL (ref 70–99)
Potassium: 4.1 mEq/L (ref 3.5–5.1)
Sodium: 142 mEq/L (ref 135–145)
Total Bilirubin: 0.4 mg/dL (ref 0.2–1.2)
Total Protein: 6.5 g/dL (ref 6.0–8.3)

## 2021-12-15 NOTE — Patient Instructions (Signed)
Wendy Pruitt , Thank you for taking time to complete your Medicare Wellness Visit. I appreciate your ongoing commitment to your health goals. Please review the following plan we discussed and let me know if I can assist you in the future.   Screening recommendations/referrals: Colonoscopy: cologuard due soon, completed 01/27/19, due 01/26/22, ordered cologuard today, someone  Mammogram: up to date , completed 07/21/21, due 07/21/22 Bone Density: due,  Recommended yearly ophthalmology/optometry visit for glaucoma screening and checkup Recommended yearly dental visit for hygiene and checkup  Vaccinations: Influenza vaccine: up to date Pneumococcal vaccine: May obtain vaccine at our office or your local pharmacy. Tdap vaccine: Due-May obtain vaccine at your local pharmacy. Shingles vaccine: -May obtain vaccine at your local pharmacy.   Covid-19:newest booster available at your local pharmacy  Advanced directives: Please bring a copy of Living Will and/or [place] for your chart, when available    Conditions/risks identified: see problem list   Next appointment: Follow up in one year for your annual wellness visit    Preventive Care 65 Years and Older, Female Preventive care refers to lifestyle choices and visits with your health care provider that can promote health and wellness. What does preventive care include? A yearly physical exam. This is also called an annual well check. Dental exams once or twice a year. Routine eye exams. Ask your health care provider how often you should have your eyes checked. Personal lifestyle choices, including: Daily care of your teeth and gums. Regular physical activity. Eating a healthy diet. Avoiding tobacco and drug use. Limiting alcohol use. Practicing safe sex. Taking low-dose aspirin every day. Taking vitamin and mineral supplements as recommended by your health care provider. What happens during an annual well check? The  services and screenings done by your health care provider during your annual well check will depend on your age, overall health, lifestyle risk factors, and family history of disease. Counseling  Your health care provider may ask you questions about your: Alcohol use. Tobacco use. Drug use. Emotional well-being. Home and relationship well-being. Sexual activity. Eating habits. History of falls. Memory and ability to understand (cognition). Work and work Statistician. Reproductive health. Screening  You may have the following tests or measurements: Height, weight, and BMI. Blood pressure. Lipid and cholesterol levels. These may be checked every 5 years, or more frequently if you are over 28 years old. Skin check. Lung cancer screening. You may have this screening every year starting at age 33 if you have a 30-pack-year history of smoking and currently smoke or have quit within the past 15 years. Fecal occult blood test (FOBT) of the stool. You may have this test every year starting at age 39. Flexible sigmoidoscopy or colonoscopy. You may have a sigmoidoscopy every 5 years or a colonoscopy every 10 years starting at age 34. Hepatitis C blood test. Hepatitis B blood test. Sexually transmitted disease (STD) testing. Diabetes screening. This is done by checking your blood sugar (glucose) after you have not eaten for a while (fasting). You may have this done every 1-3 years. Bone density scan. This is done to screen for osteoporosis. You may have this done starting at age 59. Mammogram. This may be done every 1-2 years. Talk to your health care provider about how often you should have regular mammograms. Talk with your health care provider about your test results, treatment options, and if necessary, the need for more tests. Vaccines  Your health care provider may recommend certain vaccines, such as:  Influenza vaccine. This is recommended every year. Tetanus, diphtheria, and acellular  pertussis (Tdap, Td) vaccine. You may need a Td booster every 10 years. Zoster vaccine. You may need this after age 78. Pneumococcal 13-valent conjugate (PCV13) vaccine. One dose is recommended after age 22. Pneumococcal polysaccharide (PPSV23) vaccine. One dose is recommended after age 30. Talk to your health care provider about which screenings and vaccines you need and how often you need them. This information is not intended to replace advice given to you by your health care provider. Make sure you discuss any questions you have with your health care provider. Document Released: 11/18/2015 Document Revised: 07/11/2016 Document Reviewed: 08/23/2015 Elsevier Interactive Patient Education  2017 [employer] Prevention in the Home Falls can cause injuries. They can happen to people of all ages. There are many things you can do to make your home safe and to help prevent falls. What can I do on the outside of my home? Regularly fix the edges of walkways and driveways and fix any cracks. Remove anything that might make you trip as you walk through a door, such as a raised step or threshold. Trim any bushes or trees on the path to your home. Use bright outdoor lighting. Clear any walking paths of anything that might make someone trip, such as rocks or tools. Regularly check to see if handrails are loose or broken. Make sure that both sides of any steps have handrails. Any raised decks and porches should have guardrails on the edges. Have any leaves, snow, or ice cleared regularly. Use sand or salt on walking paths during winter. Clean up any spills in your garage right away. This includes oil or grease spills. What can I do in the bathroom? Use night lights. Install grab bars by the toilet and in the tub and shower. Do not use towel bars as grab bars. Use non-skid mats or decals in the tub or shower. If you need to sit down in the shower, use a plastic, non-slip stool. Keep the floor  dry. Clean up any water that spills on the floor as soon as it happens. Remove soap buildup in the tub or shower regularly. Attach bath mats securely with double-sided non-slip rug tape. Do not have throw rugs and other things on the floor that can make you trip. What can I do in the bedroom? Use night lights. Make sure that you have a light by your bed that is easy to reach. Do not use any sheets or blankets that are too big for your bed. They should not hang down onto the floor. Have a firm chair that has side arms. You can use this for support while you get dressed. Do not have throw rugs and other things on the floor that can make you trip. What can I do in the kitchen? Clean up any spills right away. Avoid walking on wet floors. Keep items that you use a lot in easy-to-reach places. If you need to reach something above you, use a strong step stool that has a grab bar. Keep electrical cords out of the way. Do not use floor polish or wax that makes floors slippery. If you must use wax, use non-skid floor wax. Do not have throw rugs and other things on the floor that can make you trip. What can I do with my stairs? Do not leave any items on the stairs. Make sure that there are handrails on both sides of the stairs and use them.  Fix handrails that are broken or loose. Make sure that handrails are as long as the stairways. Check any carpeting to make sure that it is firmly attached to the stairs. Fix any carpet that is loose or worn. Avoid having throw rugs at the top or bottom of the stairs. If you do have throw rugs, attach them to the floor with carpet tape. Make sure that you have a light switch at the top of the stairs and the bottom of the stairs. If you do not have them, ask someone to add them for you. What else can I do to help prevent falls? Wear shoes that: Do not have high heels. Have rubber bottoms. Are comfortable and fit you well. Are closed at the toe. Do not wear  sandals. If you use a stepladder: Make sure that it is fully opened. Do not climb a closed stepladder. Make sure that both sides of the stepladder are locked into place. Ask someone to hold it for you, if possible. Clearly mark and make sure that you can see: Any grab bars or handrails. First and last steps. Where the edge of each step is. Use tools that help you move around (mobility aids) if they are needed. These include: Canes. Walkers. Scooters. Crutches. Turn on the lights when you go into a dark area. Replace any light bulbs as soon as they burn out. Set up your furniture so you have a clear path. Avoid moving your furniture around. If any of your floors are uneven, fix them. If there are any pets around you, be aware of where they are. Review your medicines with your doctor. Some medicines can make you feel dizzy. This can increase your chance of falling. Ask your doctor what other things that you can do to help prevent falls. This information is not intended to replace advice given to you by your health care provider. Make sure you discuss any questions you have with your health care provider. Document Released: 08/18/2009 Document Revised: 03/29/2016 Document Reviewed: 11/26/2014 Elsevier Interactive Patient Education  2017 [employer].

## 2021-12-18 ENCOUNTER — Ambulatory Visit (INDEPENDENT_AMBULATORY_CARE_PROVIDER_SITE_OTHER): Payer: Medicare Other | Admitting: Family Medicine

## 2021-12-18 ENCOUNTER — Other Ambulatory Visit: Payer: Self-pay

## 2021-12-18 ENCOUNTER — Encounter: Payer: Self-pay | Admitting: Family Medicine

## 2021-12-18 VITALS — BP 120/84 | HR 90 | Temp 97.4°F | Ht 68.0 in | Wt 163.0 lb

## 2021-12-18 DIAGNOSIS — Z0000 Encounter for general adult medical examination without abnormal findings: Secondary | ICD-10-CM

## 2021-12-18 DIAGNOSIS — G4700 Insomnia, unspecified: Secondary | ICD-10-CM

## 2021-12-18 DIAGNOSIS — Z7189 Other specified counseling: Secondary | ICD-10-CM

## 2021-12-18 NOTE — Progress Notes (Signed)
This visit occurred during the SARS-CoV-2 public health emergency.  Safety protocols were in place, including screening questions prior to the visit, additional usage of staff PPE, and extensive cleaning of exam room while observing appropriate contact time as indicated for disinfecting solutions.  Insomnia.  She is able to get 6-7 hours with lunesta.  She doesn't sleep well w/o med use.  No ADE on med.  She doesn't want to go to [facility] but it is available there for $3.50 per month.  She doesn't want to change to other meds given the effect that she gets with Lunesta.  We talked about her situation.  I sent in an alternative medication in the hopes that she would not be left without some form of help, when her Lunesta prescription was initially denied.  Flu 2022 Shingles discussed with patient.   PNA vaccine encouraged.  Tetanus 1999 covid vaccine 2022 Cologuard pending 2023.   Breast cancer screening 2022 Bone density test discussed with patient.  Encouraged. Pap smear/pelvic exam not due given her age.   Advance directive. Would have her daughter Joya Salm if patient were incapacitated.  Smoking d/w pt.  Encouraged cessation. Averaging about 1/2 PPD.   D/w pt about taking aspirin- she is holding for a dental extraction, d/w pt.    Meds, vitals, and allergies reviewed.   ROS: Per HPI unless specifically indicated in ROS section   GEN: nad, alert and oriented HEENT: ncat NECK: supple w/o LA CV: rrr. PULM: ctab, no inc wob ABD: soft, +bs EXT: no edema SKIN: no acute rash

## 2021-12-18 NOTE — Patient Instructions (Signed)
Take care.  Glad to see you. Update me as needed.  Thanks for your effort.  

## 2021-12-23 NOTE — Assessment & Plan Note (Signed)
Flu 2022 Shingles discussed with patient.   PNA vaccine encouraged.  Tetanus 1999 covid vaccine 2022 Cologuard pending 2023.   Breast cancer screening 2022 Bone density test discussed with patient.  Encouraged. Pap smear/pelvic exam not due given her age.   Advance directive. Would have her daughter Joya Salm if patient were incapacitated.  Smoking d/w pt.  Encouraged cessation. Averaging about 1/2 PPD.

## 2021-12-23 NOTE — Assessment & Plan Note (Addendum)
See above.  Detailed conversation.  Would continue Lunesta use.  She can update me as needed. 30 minutes were devoted to patient care in this encounter (this includes time spent reviewing the patient's file/history, interviewing and examining the patient, counseling/reviewing plan with patient).

## 2021-12-23 NOTE — Assessment & Plan Note (Signed)
Advance directive. Would have her daughter Kim designated if patient were incapacitated.  

## 2022-02-14 LAB — COLOGUARD

## 2022-03-09 LAB — COLOGUARD: COLOGUARD: POSITIVE — AB

## 2022-03-11 ENCOUNTER — Telehealth: Payer: Self-pay | Admitting: Family Medicine

## 2022-03-11 DIAGNOSIS — R195 Other fecal abnormalities: Secondary | ICD-10-CM

## 2022-03-11 NOTE — Telephone Encounter (Signed)
Please call patient.  Cologuard test is positive.  This does not mean she has colon cancer but she needs to see GI and I want her to get a call from [place] before she gets a call from GI.  This is not an urgent referral.  I have the referral pended below.  Please update patient and then let me know when I can sign the referral.  I started the referral for [facility].  Please let me know if that is not okay with the patient.  Thanks. ?

## 2022-03-13 NOTE — Telephone Encounter (Signed)
Please explain that this test does not have a high false positive result rate.  She doesn't have to go to GI, but it would be against medical advice not to see GI.  And if she isn't going to go to see GI, then there is no point in doing extra colon cancer screening in the future.   ?

## 2022-03-13 NOTE — Telephone Encounter (Signed)
Called and spoke with patient about results. Patient states she does not want referral. States she is fine and goes to the bathroom fine. She has been doing reading online about these tests and states they have high rates for false positive readings. Patient stated if she starts to have any problems and needs referral she will call back.  ?

## 2022-03-14 NOTE — Telephone Encounter (Signed)
Pt called back but Janett Billow was on lunch, she asked for a call back at [phone] but she said she will be in meetings this afternoon, but she wont be out until around 4:30. The next time will be tomorrow from 8-10am or the next is Friday when she is off ?

## 2022-03-14 NOTE — Telephone Encounter (Signed)
LMTCB

## 2022-03-16 NOTE — Telephone Encounter (Signed)
Pt called back and I read her the message from Dr Damita Dunnings. She said she needs to think about it more. Said she has a lot going on. I advised her that it may be best that she at least schedule the GI appt for a few months. ? ?Also, Pt received a bill for $10 after her MCW exam with Dr Damita Dunnings. She has [facility] Medicare. She called [facility] [employer] and she said it came from discussing other things during that visit. I advised that I thought that was a small charge for something. I offered to give her the [facility] Patient Accts Number and she said not to worry about it. ?

## 2022-03-16 NOTE — Telephone Encounter (Signed)
lmtcb

## 2022-03-18 NOTE — Telephone Encounter (Signed)
Noted.  We have clearly advised her to see GI.  I cancelled the GI referral for now but we gave her ample opportunity to follow medical advice.   ?

## 2022-04-10 ENCOUNTER — Ambulatory Visit: Payer: Self-pay

## 2022-04-10 ENCOUNTER — Emergency Department
Admission: EM | Admit: 2022-04-10 | Discharge: 2022-04-10 | Disposition: A | Discharge status: Home / Self Care | Payer: Medicare Other | Attending: Emergency Medicine | Admitting: Emergency Medicine

## 2022-04-10 ENCOUNTER — Encounter: Payer: Self-pay | Admitting: *Deleted

## 2022-04-10 ENCOUNTER — Other Ambulatory Visit: Payer: Self-pay

## 2022-04-10 ENCOUNTER — Telehealth: Payer: Self-pay

## 2022-04-10 DIAGNOSIS — R55 Syncope and collapse: Secondary | ICD-10-CM | POA: Diagnosis not present

## 2022-04-10 DIAGNOSIS — R42 Dizziness and giddiness: Secondary | ICD-10-CM | POA: Diagnosis present

## 2022-04-10 LAB — CBC
HCT: 42.9 % (ref 36.0–46.0)
Hemoglobin: 14.1 g/dL (ref 12.0–15.0)
MCH: 29.7 pg (ref 26.0–34.0)
MCHC: 32.9 g/dL (ref 30.0–36.0)
MCV: 90.5 fL (ref 80.0–100.0)
Platelets: 298 K/uL (ref 150–400)
RBC: 4.74 MIL/uL (ref 3.87–5.11)
RDW: 13.3 % (ref 11.5–15.5)
WBC: 10.7 K/uL — ABNORMAL HIGH (ref 4.0–10.5)
nRBC: 0 % (ref 0.0–0.2)

## 2022-04-10 LAB — BASIC METABOLIC PANEL
Anion gap: 6 (ref 5–15)
BUN: 18 mg/dL (ref 8–23)
CO2: 25 mmol/L (ref 22–32)
Calcium: 8.9 mg/dL (ref 8.9–10.3)
Chloride: 109 mmol/L (ref 98–111)
Creatinine, Ser: 0.97 mg/dL (ref 0.44–1.00)
GFR, Estimated: >60 mL/min (ref >60)
Glucose, Bld: 95 mg/dL (ref 70–99)
Potassium: 5.7 mmol/L — ABNORMAL HIGH (ref 3.5–5.1)
Sodium: 140 mmol/L (ref 135–145)

## 2022-04-10 LAB — TROPONIN I (HIGH SENSITIVITY): Troponin I (High Sensitivity): 4 ng/L (ref <18)

## 2022-04-10 LAB — URINALYSIS, ROUTINE W REFLEX MICROSCOPIC
Bacteria, UA: NONE SEEN
Bilirubin Urine: NEGATIVE
Glucose, UA: NEGATIVE mg/dL
Ketones, ur: 5 mg/dL — AB
Leukocytes,Ua: NEGATIVE
Nitrite: NEGATIVE
Protein, ur: NEGATIVE mg/dL
Specific Gravity, Urine: 1.01 (ref 1.005–1.030)
pH: 6 (ref 5.0–8.0)

## 2022-04-10 NOTE — ED Triage Notes (Signed)
First Nurse Note:  Arrives c/o feeling blurred vision while out on the golf course and fell backward onto the grass.  Denies LOC. States has had a similar episode in the past.  Currently denies complaint.  Ambulates with easy and steady gait.  NAD

## 2022-04-10 NOTE — Discharge Instructions (Signed)
Your lab tests today were all okay.  Your episode of dizziness may be due to the effects of hot weather and dehydration.  Drink plenty of fluids and stay inside for the next 24 hours to ensure you are back to normal.

## 2022-04-10 NOTE — ED Triage Notes (Addendum)
Pt was on the golf course today, pt became dizzy and had blurred vision and fell.  Pt did not pass out.  No headache.  Pt hit head on the ground.  No loc.  No neck or back pain .  Pt alert  speech clear.  No n/v    no blood thinners.  No confusion.  No headache , chest pain or sob.

## 2022-04-10 NOTE — ED Provider Notes (Signed)
[facility] Provider Note    Event Date/Time   First MD Initiated Contact with Patient 04/10/22 1741     (approximate)   History   Chief Complaint: Dizziness   HPI  Wendy Pruitt is a 66 y.o. female with no significant past medical history who was playing golf today and 85 degree sunny weather when she had an episode of lightheadedness and blurry vision.  She did not pass out but it did cause her to have to lowered herself to the ground.  No headache, symptoms quickly resolved.  She may have hit her head, but she denies any significant trauma, no neck pain.  Feels back to normal now.  No vomiting.  No chest pain or abdominal pain or shortness of breath.     Physical Exam   Triage Vital Signs: ED Triage Vitals  Enc Vitals Group     BP 04/10/22 1547 (!) 142/83     Pulse Rate 04/10/22 1547 87     Resp 04/10/22 1547 20     Temp 04/10/22 1547 98.6 F (37 C)     Temp Source 04/10/22 1547 Oral     SpO2 04/10/22 1547 97 %     Weight 04/10/22 1548 165 lb (74.8 kg)     Height 04/10/22 1548 '5\' 8"'$  (1.727 m)     Head Circumference --      Peak Flow --      Pain Score 04/10/22 1548 0     Pain Loc --      Pain Edu? --      Excl. in [place]? --     Most recent vital signs: Vitals:   04/10/22 1547 04/10/22 1802  BP: (!) 142/83 140/80  Pulse: 87 80  Resp: 20 18  Temp: 98.6 F (37 C)   SpO2: 97% 98%    General: Awake, no distress.  CV:  Good peripheral perfusion.  Regular rate rhythm Resp:  Normal effort.  Abd:  No distention.  Soft nontender Other:  No outward signs of trauma.  Neurologically intact.   ED Results / Procedures / Treatments   Labs (all labs ordered are listed, but only abnormal results are displayed) Labs Reviewed  BASIC METABOLIC PANEL - Abnormal; Notable for the following components:      Result Value   Potassium 5.7 (*)    All other components within normal limits  CBC - Abnormal; Notable for the following components:   WBC  10.7 (*)    All other components within normal limits  URINALYSIS, ROUTINE W REFLEX MICROSCOPIC - Abnormal; Notable for the following components:   Color, Urine STRAW (*)    APPearance CLEAR (*)    Hgb urine dipstick SMALL (*)    Ketones, ur 5 (*)    All other components within normal limits  CBG MONITORING, ED  TROPONIN I (HIGH SENSITIVITY)  TROPONIN I (HIGH SENSITIVITY)     EKG Interpreted by me Sinus rhythm rate of 84.  Left axis, right bundle branch block, LVH.  No acute ischemic changes.   RADIOLOGY    PROCEDURES:  Procedures   MEDICATIONS ORDERED IN ED: Medications - No data to display   IMPRESSION / MDM / [place] / ED COURSE  I reviewed the triage vital signs and the nursing notes.                              Differential diagnosis  includes, but is not limited to, dehydration, heat syncope, electrolyte abnormality, anemia, UTI  Patient's presentation is most consistent with acute illness / injury with system symptoms.  Patient presents with an episode of near syncope.  She is asymptomatic and neurologically intact on arrival to the ED.  Vital signs are normal.  She is ambulatory.  No cardiac symptoms or signs of any ACS PE dissection stroke intracranial hemorrhage or infection.  Labs are unremarkable, stable for discharge home.       FINAL CLINICAL IMPRESSION(S) / ED DIAGNOSES   Final diagnoses:  Near syncope     Rx / DC Orders   ED Discharge Orders     None        Note:  This document was prepared using Dragon voice recognition software and may include unintentional dictation errors.   Carrie Mew, MD 04/10/22 [phone]

## 2022-04-10 NOTE — Telephone Encounter (Signed)
Patent examiner with access nurse called; Around 12 noon pt was on golf course and got dizzy where everything was spinning around, blurred vision and pt was trying to get to golf course but her feet would not move and pt lost balance and fell backwards and hit her head on the ground. Pt said she did not lose consciousness. Now pt has dull H/A, no blurred vision or dizziness now. Pts mouth is not dry and does not have abd pain but was very dizzy prior to fall. Pt said she has drank some water this morning but not sure if could be dehydrated or not. Pt said has had similar episodes off and on for 1 year but this was the worse episode so far. Pt said one wk ago she got dizzy and blurred vision but she sat down and after few mins was OK. Pt does not ck her BPs at home.Pt does not want to go to ED. No available appts at [facility] this afternoon. Pt said maybe she could see neurologist. I spoke with Dr Damita Dunnings who said to do warm transfer to Dr Georgie Chard office and or UC. I spoke with Margreta Journey at Dr Georgie Chard office and explained above and she said pt might could see Dr Tomi Likens sometime this month so I did warm transfer. Pt had already agreed to go to UC and I scheduled pt at [facility] for 04/10/22 at 3:15 with UC & ED precautions given and pt voiced understanding. Sending note to Dr Damita Dunnings.     [facility] - Client TELEPHONE ADVICE RECORD AccessNurse Patient Name: Wendy Pruitt Gender: Female DOB: 1956-01-19 Age: 66 Y 10 M 20 D Return Phone Number: [phone] (Primary) Address: City/ State/ Zip: [place]  [address] Client [facility] - Client Client Site [place] - Day Provider Renford Dills - MD Contact Type Call Who Is Calling Patient / Member / Family / Caregiver Call Type Triage / Clinical Relationship To Patient Self Return Phone Number [phone] (Primary) Chief Complaint Head Injury (non urgent  symptom) Reason for Call Symptomatic / Request for [place] states she was out on the golf course and her vision went blurred, became dizzy, and then fell and hit her head. States it wasn't hot outside. States she isnn't confused. States she isn't dizzy still but very nervous. Translation No Nurse Assessment Nurse: Velta Addison, RN, [place] Date/Time (Eastern Time): 04/10/2022 1:27:09 PM Confirm and document reason for call. If symptomatic, describe symptoms. ---Caller states she was out on the golf course and her vision went blurred, became dizzy, and then fell and hit her head. States it wasn't hot outside. States she isn't confused. States she isn't dizzy still but very nervous. States she has had this for a few years. States her feet were numb, could not move her feet, fell backwards in front of the golf cart. Hit head on the ground. Did not loose consciousness that she is aware of. Does the patient have any new or worsening symptoms? ---Yes Will a triage be completed? ---Yes Related visit to physician within the last 2 weeks? ---Yes Does the PT have any chronic conditions? (i.e. diabetes, asthma, this includes High risk factors for pregnancy, etc.) ---No Is this a behavioral health or substance abuse call? ---No Guidelines Guideline Title Affirmed Question Affirmed Notes Nurse Date/Time (Eastern Time) Head Injury Sounds like a serious injury to the triager Velta Addison, [place], [place] 04/10/2022 1:30:36 PM  PLEASE NOTE: All timestamps contained within this report are represented as [place] Standard Time. CONFIDENTIALTY NOTICE: This fax transmission is intended only for the addressee. It contains information that is legally privileged, confidential or otherwise protected from use or disclosure. If you are not the intended recipient, you are strictly prohibited from reviewing, disclosing, copying using or disseminating any of this information or taking any  action in reliance on or regarding this information. If you have received this fax in error, please notify us immediately by telephone so that we can arrange for its return to [place]. Phone: [phone], Toll-Free: [phone], Fax: [phone] Page: 2 of 2 Call Id: [id number] [place]. Time Eilene Ghazi Time) Disposition Final User 04/10/2022 1:26:22 PM Attempt made - message left [place], RN, Crystal 04/10/2022 1:44:14 PM Go to ED Now Yes Velta Addison, RN, [place] Disagree/Comply Disagree Caller Understands Yes PreDisposition Call Doctor Care Advice Given Per Guideline GO TO ED NOW: * You need to be seen in the Emergency Department. * Go to the ED at ___________ [place] now. Drive carefully. ANOTHER ADULT SHOULD DRIVE: * It is better and safer if another adult drives instead of you. CARE ADVICE given per Head Injury (Adult) guideline. Comments User: Hamilton Capri, RN Date/Time (Eastern Time): 04/10/2022 1:39:39 PM Caller wants to know if she needs to be seen here or with Neurologist. Advised to go to ER but states she does not trust ER and wanted to know if she could just see someone. Referrals GO TO FACILITY REFUSE

## 2022-04-11 NOTE — Telephone Encounter (Signed)
Noted. Thanks.

## 2022-04-12 ENCOUNTER — Telehealth: Payer: Self-pay | Admitting: Cardiovascular Disease

## 2022-04-12 NOTE — Telephone Encounter (Signed)
STAT if patient feels like he/she is going to faint   Are you dizzy now?  No, dizziness comes and goes   Do you feel faint or have you passed out?  Tuesday, 6/06 patient became weak and fell, but did not lose consciousness  Do you have any other symptoms?  Patient states she has also been having blurred vision, but no symptoms currently. Review ED notes.  Have you checked your HR and BP (record if available)?  Patient states she doesn't take her BP/HR at home

## 2022-04-12 NOTE — Progress Notes (Signed)
Cardiology Office Note:   Date:  04/13/2022  NAME:  Wendy Pruitt    MRN: [id number] DOB:  [birth year]   PCP:  Tonia Ghent, MD  Cardiologist:  None  Electrophysiologist:  None   Referring MD: Tonia Ghent, MD   Chief Complaint  Patient presents with   Follow-up        History of Present Illness:   Wendy Pruitt is a 66 y.o. female with a hx of tobacco abuse who presents for follow-up. Had pre-syncope while playing golf.  She reports on Tuesday around 9:30 in the morning she bent down to put a golf ball on a TV.  She apparently felt very blurred vision.  She reports she did not feel well.  She reports she tried to sit down but fell back.  She did not lose consciousness.  Symptoms resolved and several minutes.  She did not urinate or defecate on himself.  She had no chest pain or trouble breathing.  Symptoms resolved quickly.  She may have had 4 ounces of water to drink that morning.  She has a known history of vasovagal syncope.  She had episodes of blurry vision with change of position.  She is not drinking enough water.  We discussed this again.  She is able to walk 20 to 30 minutes without chest pain or trouble breathing.  She is still smoking 8 cigarettes/day.  Her coronary CTA was normal in 2021.  Echocardiogram was normal as well.  I reviewed her visit to the emergency room.  EKG unchanged.  Troponins were negative.  She has a known history of right bundle branch block and left anterior fascicular block.  Again this is not new.  She has a known history of vasovagal syncope and this is not new as well.  Problem List 1. Glaucoma  2. Aortic atherosclerosis  -mild to moderate atherosclerosis in the descending aorta -normal CCTA, CAC score 0 11/13/2019 3. Tobacco abuse  4. DVT -R popliteal vein 10/21/2019 -Completed 6 months of therapy 5. HLD -T chol 149, HDL 41, LDL 88, TG 97 6. RBBB/LAFB  Past Medical History: Past Medical History:  Diagnosis Date   Glaucoma, both eyes     History of left breast cancer 2004--- per pt no recurrence   dx DCIS left breast s/p  total mastectomy w/ reconstruction,  NO chemo or radiation therpy  (ER and PR negative)   History of Paget's disease of breast    Insomnia    PONV (postoperative nausea and vomiting)    VIN II (vulvar intraepithelial neoplasia II)     Past Surgical History: Past Surgical History:  Procedure Laterality Date   CATARACT EXTRACTION W/ INTRAOCULAR LENS IMPLANT  1990 approx.   " left eye I think"   MASTECTOMY Left    PARTIAL MASTECTOMY INCORPATING NIPPLE AREOLAR COMPLEX Left 12-28-2002   dr young   [facility]   hx paget's disease left nipple   RE-EXCISION  PORTION OF THE LUMPECTOMY SITE Left 01-11-2003    dr young  [place] Left 02-16-2003  dr young Jacon[place]   w/ AXILLARY LYMPH NODE DISSECTION AND IMMEDIATE BREAST RECONSTRUCTION WITH SALINE IMPLANT   VAGINAL HYSTERECTOMY  1983 approx.   VULVECTOMY N/A 02/11/2018   Procedure: WIDE EXCISION VULVECTOMY;  Surgeon: Cheri Fowler, MD;  Location: [facility];  Service: Gynecology;  Laterality: N/A;    Current Medications: Current Meds  Medication Sig   aspirin EC 81 MG tablet Take 1  tablet (81 mg total) by mouth daily. Swallow whole.   desonide (DESOWEN) 0.05 % cream Apply topically daily. As needed for flares   dorzolamide-timolol (COSOPT) 22.3-6.8 MG/ML ophthalmic solution Place 1 drop into the right eye 2 (two) times daily.   eszopiclone (LUNESTA) 2 MG TABS tablet Take immediately before bedtime   latanoprost (XALATAN) 0.005 % ophthalmic solution Place 1 drop into both eyes at bedtime.      Allergies:    Chantix [varenicline], Trazodone and nefazodone, and Wellbutrin [bupropion]   Social History: Social History   Socioeconomic History   Marital status: Divorced    Spouse name: Not on file   Number of children: 2   Years of education: Not on file   Highest education level: Not on file  Occupational History   Occupation:  Development worker, international aid: UNEMPLOYED  Tobacco Use   Smoking status: Every Day    Packs/day: 0.50    Years: 30.00    Total pack years: 15.00    Types: Cigarettes   Smokeless tobacco: Never  Vaping Use   Vaping Use: Never used  Substance and Sexual Activity   Alcohol use: Never    Alcohol/week: 0.0 standard drinks of alcohol   Drug use: No   Sexual activity: Not on file  Other Topics Concern   Not on file  Social History Narrative   Divorced   2 kids local   As of 2023 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     Social Determinants of Health   Financial Resource Strain: Low Risk  (12/15/2021)   Overall Financial Resource Strain (CARDIA)    Difficulty of Paying Living Expenses: Not hard at all  Food Insecurity: No Food Insecurity (12/15/2021)   Hunger Vital Sign    Worried About Running Out of Food in the Last Year: Never true    Ran Out of Food in the Last Year: Never true  Transportation Needs: No Transportation Needs (12/15/2021)   PRAPARE - Hydrologist (Medical): No    Lack of Transportation (Non-Medical): No  Physical Activity: Sufficiently Active (12/15/2021)   Exercise Vital Sign    Days of Exercise per Week: 5 days    Minutes of Exercise per Session: 30 min  Stress: No Stress Concern Present (12/15/2021)   [place]    Feeling of Stress : Not at all  Social Connections: Moderately Isolated (12/15/2021)   Social Connection and Isolation Panel [NHANES]    Frequency of Communication with Friends and Family: More than three times a week    Frequency of Social Gatherings with Friends and Family: More than three times a week    Attends Religious Services: More than 4 times per year    Active Member of [employer] or Organizations: No    Attends Music therapist: Never    Marital Status: Divorced     Family  History: The patient's family history includes Breast cancer in her daughter and maternal grandmother; Cancer in her brother and sister; Drug abuse in her brother; Heart disease in her brother, father, and sister; Heart disease (age of onset: 70) in her mother. There is no history of Colon cancer.  ROS:   All other ROS reviewed and negative. Pertinent positives noted in the HPI.     EKGs/Labs/Other Studies Reviewed:   The following studies were personally reviewed by me today:  EKG:  EKG is ordered today.  The ekg ordered today demonstrates normal sinus rhythm heart rate 74, right bundle branch block, left anterior fascicular block, and was personally reviewed by me.   CCTA 11/02/2019 1. Coronary calcium score of 0.   2. Normal coronary origin with right dominance.   3. No evidence of CAD.   4. Mid LAD myocardial bridge (normal variant).   5. Mild to moderate atherosclerosis in the descending aorta.  TTE 10/07/2019  1. Left ventricular ejection fraction, by visual estimation, is 60 to  65%. The left ventricle has normal function. There is mildly increased  left ventricular hypertrophy.   2. Global right ventricle has normal systolic function.The right  ventricular size is normal. No increase in right ventricular wall  thickness.   3. Left atrial size was normal.   4. Right atrial size was normal.   5. Mild mitral annular calcification.   6. The mitral valve is normal in structure. Mild mitral valve  regurgitation. No evidence of mitral stenosis.   7. The tricuspid valve is normal in structure. Tricuspid valve  regurgitation is mild.   8. The aortic valve is tricuspid. Aortic valve regurgitation is not  visualized. Mild aortic valve sclerosis without stenosis.   9. The pulmonic valve was grossly normal. Pulmonic valve regurgitation is  trivial.  10. Normal pulmonary artery systolic pressure.  11. The inferior vena cava is normal in size with greater than 50%  respiratory  variability, suggesting right atrial pressure of 3 mmHg.   Recent Labs: 12/15/2021: ALT 9 04/10/2022: BUN 18; Creatinine, Ser 0.97; Hemoglobin 14.1; Platelets 298; Potassium 5.7; Sodium 140   Recent Lipid Panel    Component Value Date/Time   CHOL 142 12/15/2021 0822   TRIG 77.0 12/15/2021 0822   HDL 44.80 12/15/2021 0822   CHOLHDL 3 12/15/2021 0822   VLDL 15.4 12/15/2021 0822   LDLCALC 82 12/15/2021 0822    Physical Exam:   VS:  BP 116/84   Pulse 74   Ht 5' 8.5" (1.74 m)   Wt 165 lb 12.8 oz (75.2 kg)   SpO2 95%   BMI 24.84 kg/m    Wt Readings from Last 3 Encounters:  04/13/22 165 lb 12.8 oz (75.2 kg)  04/10/22 165 lb (74.8 kg)  12/18/21 163 lb (73.9 kg)    General: Well nourished, well developed, in no acute distress Head: Atraumatic, normal size  Eyes: PEERLA, EOMI  Neck: Supple, no JVD Endocrine: No thryomegaly Cardiac: Normal S1, S2; RRR; no murmurs, rubs, or gallops Lungs: Clear to auscultation bilaterally, no wheezing, rhonchi or rales  Abd: Soft, nontender, no hepatomegaly  Ext: No edema, pulses 2+ Musculoskeletal: No deformities, BUE and BLE strength normal and equal Skin: Warm and dry, no rashes   Neuro: Alert and oriented to person, place, time, and situation, CNII-XII grossly intact, no focal deficits  Psych: Normal mood and affect   ASSESSMENT:   Wendy Pruitt is a 66 y.o. female who presents for the following: 1. Vasovagal attack   2. Dizziness   3. RBBB   4. LAFB (left anterior fascicular block)   5. Tobacco abuse     PLAN:   1. Vasovagal attack 2. Dizziness -She had a recent episode of vasovagal attack.  She has a known history of this.  She was playing golf.  She bent down and developed blurry vision.  She had 4 ounces of water to drink that morning.  She has a known history of this.  Work-up in the past has included coronary CTA that has been normal.  Echocardiogram was also normal.  Known history of right bundle branch block and left anterior  fascicular block.  Given the positional change I believe this was a vasovagal attack and nothing is new here.  I have recommended to drink 60 to 80 ounces of water per day.  No need for further testing.  Everything about the story was quite benign and unremarkable.  She has a very known history of this condition.  I see no need for neurology evaluation.  I recommended to continue exercising and to drink 60 to 80 ounces of water per day.  She is not nearly getting half of this.  3. RBBB 4. LAFB (left anterior fascicular block) -Known history of right bundle branch block and left anterior fascicular block.  EKG shows PR interval is normal.  Nothing is worsening here.  Symptoms are more consistent with vasovagal episode.  5. Tobacco abuse -Still smoking 8 cigarettes/day.  3 minutes of smoking cessation counseling was provided.  Disposition: Return in about 1 year (around 04/14/2023).  Medication Adjustments/Labs and Tests Ordered: Current medicines are reviewed at length with the patient today.  Concerns regarding medicines are outlined above.  Orders Placed This Encounter  Procedures   EKG 12-Lead   No orders of the defined types were placed in this encounter.   Patient Instructions  Medication Instructions:  The current medical regimen is effective;  continue present plan and medications.  *If you need a refill on your cardiac medications before your next appointment, please call your pharmacy*   Follow-Up: At [facility], you and your health needs are our priority.  As part of our continuing mission to provide you with exceptional heart care, we have created designated Provider Care Teams.  These Care Teams include your primary Cardiologist (physician) and Advanced Practice Providers (APPs -  Physician Assistants and Nurse Practitioners) who all work together to provide you with the care you need, when you need it.  We recommend signing up for the patient portal called "MyChart".  Sign  up information is provided on this After Visit Summary.  MyChart is used to connect with patients for Virtual Visits (Telemedicine).  Patients are able to view lab/test results, encounter notes, upcoming appointments, etc.  Non-urgent messages can be sent to your provider as well.   To learn more about what you can do with MyChart, go to [email or web address].    Your next appointment:   12 month(s)  The format for your next appointment:   In Person  Provider:   Eleonore Chiquito, MD             Time Spent with Patient: I have spent a total of 25 minutes with patient reviewing hospital notes, telemetry, EKGs, labs and examining the patient as well as establishing an assessment and plan that was discussed with the patient.  > 50% of time was spent in direct patient care.  Signed, Addison Naegeli. Audie Box, MD, [place]  [address], [place], [place] [address] [phone]  04/13/2022 11:26 AM

## 2022-04-12 NOTE — Telephone Encounter (Signed)
Patient reports that on 6/6, she was on golf course, had an episode of blurred vision and fell. She hit her head and left arm, bruising her arm. She went to the ED for evaluation. She has not been taking ASA for the past 2 months. She went to eye doc 2 weeks ago. She has an appointment this month with neuro. She "tries to stay hydrated". She states that sometimes her urine is dark yellow. Encourage her to drink more fluids. She has questions about her ED EKG results, wants to know of her activity level, and wants to know if she is at risk for MI. She said if the blurred vision happens while she is driving, she will pull over and stop the car. Dr. Audie Box informed of this notation. Patient schedule to see Dr. Audie Box tomorrow 6/9 at [facility].

## 2022-04-13 ENCOUNTER — Ambulatory Visit (INDEPENDENT_AMBULATORY_CARE_PROVIDER_SITE_OTHER): Payer: Medicare Other | Admitting: Cardiovascular Disease

## 2022-04-13 ENCOUNTER — Encounter: Payer: Self-pay | Admitting: Cardiovascular Disease

## 2022-04-13 VITALS — BP 116/84 | HR 74 | Ht 68.5 in | Wt 165.8 lb

## 2022-04-13 DIAGNOSIS — F17210 Nicotine dependence, cigarettes, uncomplicated: Secondary | ICD-10-CM | POA: Diagnosis not present

## 2022-04-13 DIAGNOSIS — Z720 Tobacco use: Secondary | ICD-10-CM

## 2022-04-13 DIAGNOSIS — I4510 Unspecified right bundle-branch block: Secondary | ICD-10-CM

## 2022-04-13 DIAGNOSIS — R42 Dizziness and giddiness: Secondary | ICD-10-CM | POA: Diagnosis not present

## 2022-04-13 DIAGNOSIS — I444 Left anterior fascicular block: Secondary | ICD-10-CM | POA: Diagnosis not present

## 2022-04-13 DIAGNOSIS — R55 Syncope and collapse: Secondary | ICD-10-CM

## 2022-04-13 NOTE — Patient Instructions (Signed)
Medication Instructions:  The current medical regimen is effective;  continue present plan and medications.  *If you need a refill on your cardiac medications before your next appointment, please call your pharmacy*   Follow-Up: At [facility], you and your health needs are our priority.  As part of our continuing mission to provide you with exceptional heart care, we have created designated Provider Care Teams.  These Care Teams include your primary Cardiologist (physician) and Advanced Practice Providers (APPs -  Physician Assistants and Nurse Practitioners) who all work together to provide you with the care you need, when you need it.  We recommend signing up for the patient portal called "MyChart".  Sign up information is provided on this After Visit Summary.  MyChart is used to connect with patients for Virtual Visits (Telemedicine).  Patients are able to view lab/test results, encounter notes, upcoming appointments, etc.  Non-urgent messages can be sent to your provider as well.   To learn more about what you can do with MyChart, go to [email or web address].    Your next appointment:   12 month(s)  The format for your next appointment:   In Person  Provider:   [place] O'Neal, MD     

## 2022-04-17 ENCOUNTER — Encounter: Payer: Self-pay | Admitting: Family Medicine

## 2022-04-17 ENCOUNTER — Ambulatory Visit: Payer: Medicare Other | Admitting: Family Medicine

## 2022-04-17 DIAGNOSIS — R55 Syncope and collapse: Secondary | ICD-10-CM

## 2022-04-17 NOTE — Patient Instructions (Signed)
Don't change your meds for now.  Let me check with the eye clinic in the meantime about your eye drops.  Keep drinking ~60 ounces of water a day.  Take care.  Glad to see you. If you have any episodes or near episodes, then kneel down.

## 2022-04-17 NOTE — Progress Notes (Signed)
She was out playing golf, in the AM, not a hot day.  Leaning over to put the tee down, in the tee box, then her eyes got blurry.  She was aware of the sx at the time.  She could still talk.  She was walking to the golf cart and went down, "I was dizzy."  She didn't have full syncope.  Went to ER and had neg troponin.  K was elevated but blood was hemolyzed on lab sample.  She was able to putt some and ride for about 10 holes thereafter. After about 15 minutes she was feeling better.  She has cards visit re: vasovagal attack.   She had h/o similar in the past.    Smoking and water intake d/w pt.   She is trying to quit smoking.  She cut back to 6-7 cigs a day.  She isn't taking cigs to work or in her car.  She is trying to use mints instead.    She has cataract surgery pending.    Meds, vitals, and allergies reviewed.   ROS: Per HPI unless specifically indicated in ROS section   GEN: nad, alert and oriented HEENT: ncat NECK: supple w/o LA CV: rrr. PULM: ctab, no inc wob ABD: soft, +bs EXT: no edema SKIN: no acute rash  30 minutes were devoted to patient care in this encounter (this includes time spent reviewing the patient's file/history, interviewing and examining the patient, counseling/reviewing plan with patient).

## 2022-04-19 ENCOUNTER — Telehealth: Payer: Self-pay | Admitting: Family Medicine

## 2022-04-19 DIAGNOSIS — R55 Syncope and collapse: Secondary | ICD-10-CM | POA: Insufficient documentation

## 2022-04-19 NOTE — Telephone Encounter (Signed)
Please send a note to the eye clinic, Dr. Katy Fitch.  She has a history of vasovagal events.  I do not know if it is possible that her beta-blocker in her eyedrop could contribute to her events.  If he knows of any alternative agents or if he thinks this is contributing, I would appreciate his input.  Thanks.

## 2022-04-19 NOTE — Assessment & Plan Note (Signed)
Discussed cautions, discussed fluid intake, discussed kneeling down if she has symptoms.  She already had cardiology evaluation.  Discussed her eyedrops.  I will ask for ophthalmology input, if either of her eyedrops could be contributing, especially her timolol.  At this point still okay for outpatient follow-up.

## 2022-04-20 NOTE — Telephone Encounter (Signed)
Tried to call [facility] today about this but they were closed when I called.

## 2022-04-23 ENCOUNTER — Ambulatory Visit: Payer: Medicare Other | Admitting: Neurology

## 2022-04-23 NOTE — Telephone Encounter (Signed)
[facility] was calling to speak with the nurse regarding this patient, about medications she said she did not know the name of these medications, please advise when possible, thanks.   Callback Number: [phone]

## 2022-04-23 NOTE — Telephone Encounter (Signed)
Left a message on voicemail for someone from Dr. Zenia Resides office to call the office back regarding this patient.  See message from Dr. Damita Dunnings

## 2022-04-23 NOTE — Telephone Encounter (Signed)
Spoke to Dr. Midge Aver and relayed the message from [place]. Dr. Katy Fitch stated that he has heard of this happening in the past but it is rare. Dr. Katy Fitch stated that he has the patient on dorzolamide-timolol and will go ahead and switch her to plain dorzolamide. Dr. Midge Aver stated that they will contact the patient about this change and nothing that we have to do.

## 2022-04-24 NOTE — Telephone Encounter (Signed)
Noted. Thanks.

## 2022-06-13 ENCOUNTER — Encounter: Payer: Self-pay | Admitting: Dermatology

## 2022-06-13 ENCOUNTER — Ambulatory Visit: Payer: Medicare Other | Admitting: Dermatology

## 2022-06-13 DIAGNOSIS — Z1283 Encounter for screening for malignant neoplasm of skin: Secondary | ICD-10-CM | POA: Diagnosis not present

## 2022-06-13 DIAGNOSIS — L821 Other seborrheic keratosis: Secondary | ICD-10-CM

## 2022-06-13 DIAGNOSIS — D229 Melanocytic nevi, unspecified: Secondary | ICD-10-CM

## 2022-06-13 DIAGNOSIS — D485 Neoplasm of uncertain behavior of skin: Secondary | ICD-10-CM | POA: Diagnosis not present

## 2022-06-13 DIAGNOSIS — L719 Rosacea, unspecified: Secondary | ICD-10-CM

## 2022-06-13 DIAGNOSIS — L578 Other skin changes due to chronic exposure to nonionizing radiation: Secondary | ICD-10-CM | POA: Diagnosis not present

## 2022-06-13 DIAGNOSIS — D1800 Hemangioma unspecified site: Secondary | ICD-10-CM

## 2022-06-13 DIAGNOSIS — L814 Other melanin hyperpigmentation: Secondary | ICD-10-CM

## 2022-06-13 NOTE — Progress Notes (Signed)
Follow-Up Visit   Subjective  Wendy Pruitt is a 66 y.o. female who presents for the following: No chief complaint on file.. The patient presents for Total-Body Skin Exam (TBSE) for skin cancer screening and mole check.  The patient has spots, moles and lesions to be evaluated, some may be new or changing and the patient has concerns that these could be cancer.  The following portions of the chart were reviewed this encounter and updated as appropriate:   Tobacco  Allergies  Meds  Problems  Med Hx  Surg Hx  Fam Hx     Review of Systems:  No other skin or systemic complaints except as noted in HPI or Assessment and Plan.  Objective  Well appearing patient in no apparent distress; mood and affect are within normal limits.  A full examination was performed including scalp, head, eyes, ears, nose, lips, neck, chest, axillae, abdomen, back, buttocks, bilateral upper extremities, bilateral lower extremities, hands, feet, fingers, toes, fingernails, and toenails. All findings within normal limits unless otherwise noted below.  R sup med scapula 2.5 x 1.5 cm flat tan papules.      Assessment & Plan  Neoplasm of uncertain behavior of skin R sup med scapula  Skin / nail biopsy Type of biopsy: tangential   Informed consent: discussed and consent obtained   Timeout: patient name, date of birth, surgical site, and procedure verified   Procedure prep:  Patient was prepped and draped in usual sterile fashion Prep type:  Isopropyl alcohol Anesthesia: the lesion was anesthetized in a standard fashion   Anesthetic:  1% lidocaine w/ epinephrine 1-100,000 buffered w/ 8.4% NaHCO3 Instrument used: flexible razor blade   Hemostasis achieved with: pressure, aluminum chloride and electrodesiccation   Outcome: patient tolerated procedure well   Post-procedure details: sterile dressing applied and wound care instructions given   Dressing type: bandage and petrolatum    Specimen 1 - Surgical  pathology Differential Diagnosis: D48.5 r/o ISK vs other Check Margins: No  Rosacea Face  Rosacea is a chronic progressive skin condition usually affecting the face of adults, causing redness and/or acne bumps. It is treatable but not curable. It sometimes affects the eyes (ocular rosacea) as well. It may respond to topical and/or systemic medication and can flare with stress, sun exposure, alcohol, exercise and some foods.  Daily application of broad spectrum spf 30+ sunscreen to face is recommended to reduce flares.  Samples given of Avar and Finacea foam use QD PRN flares.   Lentigines - Scattered tan macules - Due to sun exposure - Benign-appearing, observe - Recommend daily broad spectrum sunscreen SPF 30+ to sun-exposed areas, reapply every 2 hours as needed. - Call for any changes  Seborrheic Keratoses - Stuck-on, waxy, tan-brown papules and/or plaques  - Benign-appearing - Discussed benign etiology and prognosis. - Observe - Call for any changes  Melanocytic Nevi - Tan-brown and/or pink-flesh-colored symmetric macules and papules - Benign appearing on exam today - Observation - Call clinic for new or changing moles - Recommend daily use of broad spectrum spf 30+ sunscreen to sun-exposed areas.   Hemangiomas - Red papules - Discussed benign nature - Observe - Call for any changes  Actinic Damage - Chronic condition, secondary to cumulative UV/sun exposure - diffuse scaly erythematous macules with underlying dyspigmentation - Recommend daily broad spectrum sunscreen SPF 30+ to sun-exposed areas, reapply every 2 hours as needed.  - Staying in the shade or wearing long sleeves, sun glasses (UVA+UVB protection) and wide  brim hats (4-inch brim around the entire circumference of the hat) are also recommended for sun protection.  - Call for new or changing lesions.  Skin cancer screening performed today.  Return in about 1 year (around 06/14/2023) for TBSE.  Luther Redo, CMA, am acting as scribe for Sarina Ser, MD . Documentation: I have reviewed the above documentation for accuracy and completeness, and I agree with the above.  Sarina Ser, MD

## 2022-06-13 NOTE — Patient Instructions (Addendum)
Wound Care Instructions  Cleanse wound gently with soap and water once a day then pat dry with clean gauze. Apply a thin coat of Petrolatum (petroleum jelly, "Vaseline") over the wound (unless you have an allergy to this). We recommend that you use a new, sterile tube of Vaseline. Do not pick or remove scabs. Do not remove the yellow or white "healing tissue" from the base of the wound.  Cover the wound with fresh, clean, nonstick gauze and secure with paper tape. You may use Band-Aids in place of gauze and tape if the wound is small enough, but would recommend trimming much of the tape off as there is often too much. Sometimes Band-Aids can irritate the skin.  You should call the office for your biopsy report after 1 week if you have not already been contacted.  If you experience any problems, such as abnormal amounts of bleeding, swelling, significant bruising, significant pain, or evidence of infection, please call the office immediately.  FOR ADULT SURGERY PATIENTS: If you need something for pain relief you may take 1 extra strength Tylenol (acetaminophen) AND 2 Ibuprofen (200mg each) together every 4 hours as needed for pain. (do not take these if you are allergic to them or if you have a reason you should not take them.) Typically, you may only need pain medication for 1 to 3 days.     Due to recent changes in healthcare laws, you may see results of your pathology and/or laboratory studies on MyChart before the doctors have had a chance to review them. We understand that in some cases there may be results that are confusing or concerning to you. Please understand that not all results are received at the same time and often the doctors may need to interpret multiple results in order to provide you with the best plan of care or course of treatment. Therefore, we ask that you please give us 2 business days to thoroughly review all your results before contacting the office for clarification. Should  we see a critical lab result, you will be contacted sooner.   If You Need Anything After Your Visit  If you have any questions or concerns for your doctor, please call our main line at [phone] and press option 4 to reach your doctor's medical assistant. If no one answers, please leave a voicemail as directed and we will return your call as soon as possible. Messages left after 4 pm will be answered the following business day.   You may also send us a message via MyChart. We typically respond to MyChart messages within 1-2 business days.  For prescription refills, please ask your pharmacy to contact our office. Our fax number is [phone].  If you have an urgent issue when the clinic is closed that cannot wait until the next business day, you can page your doctor at the number below.    Please note that while we do our best to be available for urgent issues outside of office hours, we are not available 24/7.   If you have an urgent issue and are unable to reach us, you may choose to seek medical care at your doctor's office, retail clinic, urgent care center, or emergency room.  If you have a medical emergency, please immediately call 911 or go to the emergency department.  Pager Numbers  - Dr. Kowalski: [phone]  - Dr. Moye: [phone]  - Dr. Stewart: [phone]  In the event of inclement weather, please call our main line at   [phone] for an update on the status of any delays or closures.  Dermatology Medication Tips: Please keep the boxes that topical medications come in in order to help keep track of the instructions about where and how to use these. Pharmacies typically print the medication instructions only on the boxes and not directly on the medication tubes.   If your medication is too expensive, please contact our office at [phone] option 4 or send us a message through MyChart.   We are unable to tell what your co-pay for medications will be in  advance as this is different depending on your insurance coverage. However, we may be able to find a substitute medication at lower cost or fill out paperwork to get insurance to cover a needed medication.   If a prior authorization is required to get your medication covered by your insurance company, please allow us 1-2 business days to complete this process.  Drug prices often vary depending on where the prescription is filled and some pharmacies may offer cheaper prices.  The website www.goodrx.com contains coupons for medications through different pharmacies. The prices here do not account for what the cost may be with help from insurance (it may be cheaper with your insurance), but the website can give you the price if you did not use any insurance.  - You can print the associated coupon and take it with your prescription to the pharmacy.  - You may also stop by our office during regular business hours and pick up a GoodRx coupon card.  - If you need your prescription sent electronically to a different pharmacy, notify our office through  MyChart or by phone at [phone] option 4.     Si Usted Necesita Algo Despus de Su Visita  Tambin puede enviarnos un mensaje a travs de MyChart. Por lo general respondemos a los mensajes de MyChart en el transcurso de 1 a 2 das hbiles.  Para renovar recetas, por favor pida a su farmacia que se ponga en contacto con nuestra oficina. Nuestro nmero de fax es el [phone].  Si tiene un asunto urgente cuando la clnica est cerrada y que no puede esperar hasta el siguiente da hbil, puede llamar/localizar a su doctor(a) al nmero que aparece a continuacin.   Por favor, tenga en cuenta que aunque hacemos todo lo posible para estar disponibles para asuntos urgentes fuera del horario de oficina, no estamos disponibles las 24 horas del da, los 7 das de la semana.   Si tiene un problema urgente y no puede comunicarse con nosotros, puede  optar por buscar atencin mdica  en el consultorio de su doctor(a), en una clnica privada, en un centro de atencin urgente o en una sala de emergencias.  Si tiene una emergencia mdica, por favor llame inmediatamente al 911 o vaya a la sala de emergencias.  Nmeros de bper  - Dr. Kowalski: [phone]  - Dra. Moye: [phone]  - Dra. Stewart: [phone]  En caso de inclemencias del tiempo, por favor llame a nuestra lnea principal al [phone] para una actualizacin sobre el estado de cualquier retraso o cierre.  Consejos para la medicacin en dermatologa: Por favor, guarde las cajas en las que vienen los medicamentos de uso tpico para ayudarle a seguir las instrucciones sobre dnde y cmo usarlos. Las farmacias generalmente imprimen las instrucciones del medicamento slo en las cajas y no directamente en los tubos del medicamento.   Si su medicamento es muy caro, por favor, pngase en contacto con   nuestra oficina llamando al [phone] y presione la opcin 4 o envenos un mensaje a travs de MyChart.   No podemos decirle cul ser su copago por los medicamentos por adelantado ya que esto es diferente dependiendo de la cobertura de su seguro. Sin embargo, es posible que podamos encontrar un medicamento sustituto a menor costo o llenar un formulario para que el seguro cubra el medicamento que se considera necesario.   Si se requiere una autorizacin previa para que su compaa de seguros cubra su medicamento, por favor permtanos de 1 a 2 das hbiles para completar este proceso.  Los precios de los medicamentos varan con frecuencia dependiendo del lugar de dnde se surte la receta y alguna farmacias pueden ofrecer precios ms baratos.  El sitio web www.goodrx.com tiene cupones para medicamentos de diferentes farmacias. Los precios aqu no tienen en cuenta lo que podra costar con la ayuda del seguro (puede ser ms barato con su seguro), pero el sitio web puede darle el  precio si no utiliz ningn seguro.  - Puede imprimir el cupn correspondiente y llevarlo con su receta a la farmacia.  - Tambin puede pasar por nuestra oficina durante el horario de atencin regular y recoger una tarjeta de cupones de GoodRx.  - Si necesita que su receta se enve electrnicamente a una farmacia diferente, informe a nuestra oficina a travs de MyChart de [place] o por telfono llamando al [phone] y presione la opcin 4.  

## 2022-06-15 ENCOUNTER — Other Ambulatory Visit: Payer: Self-pay | Admitting: Family Medicine

## 2022-06-15 NOTE — Telephone Encounter (Signed)
Refill request for ESZOPICLONE 2 MG TABLET  LOV - 04/17/22 Next OV - not scheduled Last refill - 11/17/21

## 2022-06-18 ENCOUNTER — Telehealth: Payer: Self-pay

## 2022-06-18 NOTE — Telephone Encounter (Signed)
-----   Message from Ralene Bathe, MD sent at 06/18/2022 11:19 AM EDT ----- Diagnosis Skin , right sup med scapula LENTIGO  Benign Lentigo (= "freckle") No further treatment needed

## 2022-06-18 NOTE — Telephone Encounter (Signed)
Advised patient of results/hd  

## 2022-06-27 ENCOUNTER — Other Ambulatory Visit: Payer: Self-pay | Admitting: Family Medicine

## 2022-06-27 DIAGNOSIS — Z1231 Encounter for screening mammogram for malignant neoplasm of breast: Secondary | ICD-10-CM

## 2022-07-23 ENCOUNTER — Ambulatory Visit: Payer: Medicare Other

## 2022-08-08 ENCOUNTER — Ambulatory Visit: Payer: Medicare Other

## 2022-08-28 ENCOUNTER — Ambulatory Visit
Admission: RE | Admit: 2022-08-28 | Discharge: 2022-08-28 | Disposition: A | Discharge status: Home / Self Care | Payer: Medicare Other | Source: Ambulatory Visit | Attending: Family Medicine | Admitting: Family Medicine

## 2022-08-28 DIAGNOSIS — Z1231 Encounter for screening mammogram for malignant neoplasm of breast: Secondary | ICD-10-CM

## 2022-08-28 HISTORY — DX: Encounter for nonprocreative screening for genetic disease carrier status: Z13.71

## 2022-12-09 ENCOUNTER — Other Ambulatory Visit: Payer: Self-pay | Admitting: Family Medicine

## 2022-12-09 DIAGNOSIS — E785 Hyperlipidemia, unspecified: Secondary | ICD-10-CM

## 2022-12-13 ENCOUNTER — Other Ambulatory Visit (INDEPENDENT_AMBULATORY_CARE_PROVIDER_SITE_OTHER): Payer: Medicare Other

## 2022-12-13 DIAGNOSIS — E785 Hyperlipidemia, unspecified: Secondary | ICD-10-CM

## 2022-12-13 LAB — COMPREHENSIVE METABOLIC PANEL
ALT: 6 U/L (ref 0–35)
AST: 15 U/L (ref 0–37)
Albumin: 3.8 g/dL (ref 3.5–5.2)
Alkaline Phosphatase: 85 U/L (ref 39–117)
BUN: 34 mg/dL — ABNORMAL HIGH (ref 6–23)
CO2: 30 mEq/L (ref 19–32)
Calcium: 9.5 mg/dL (ref 8.4–10.5)
Chloride: 106 mEq/L (ref 96–112)
Creatinine, Ser: 1.15 mg/dL (ref 0.40–1.20)
GFR: 48.93 mL/min — ABNORMAL LOW (ref >60.00)
Glucose, Bld: 88 mg/dL (ref 70–99)
Potassium: 4.4 mEq/L (ref 3.5–5.1)
Sodium: 144 mEq/L (ref 135–145)
Total Bilirubin: 0.4 mg/dL (ref 0.2–1.2)
Total Protein: 6.1 g/dL (ref 6.0–8.3)

## 2022-12-13 LAB — CBC WITH DIFFERENTIAL/PLATELET
Basophils Absolute: 0.1 10*3/uL (ref 0.0–0.1)
Basophils Relative: 1.1 % (ref 0.0–3.0)
Eosinophils Absolute: 0.3 10*3/uL (ref 0.0–0.7)
Eosinophils Relative: 3.8 % (ref 0.0–5.0)
HCT: 41.4 % (ref 36.0–46.0)
Hemoglobin: 13.7 g/dL (ref 12.0–15.0)
Lymphocytes Relative: 33.9 % (ref 12.0–46.0)
Lymphs Abs: 2.6 10*3/uL (ref 0.7–4.0)
MCHC: 33.1 g/dL (ref 30.0–36.0)
MCV: 90.3 fl (ref 78.0–100.0)
Monocytes Absolute: 0.8 10*3/uL (ref 0.1–1.0)
Monocytes Relative: 10.9 % (ref 3.0–12.0)
Neutro Abs: 3.8 10*3/uL (ref 1.4–7.7)
Neutrophils Relative %: 50.3 % (ref 43.0–77.0)
Platelets: 326 10*3/uL (ref 150.0–400.0)
RBC: 4.58 Mil/uL (ref 3.87–5.11)
RDW: 13.8 % (ref 11.5–15.5)
WBC: 7.5 10*3/uL (ref 4.0–10.5)

## 2022-12-13 LAB — LIPID PANEL
Cholesterol: 179 mg/dL (ref 0–200)
HDL: 41.7 mg/dL (ref >39.00)
LDL Cholesterol: 117 mg/dL — ABNORMAL HIGH (ref 0–99)
NonHDL: 136.88
Total CHOL/HDL Ratio: 4
Triglycerides: 100 mg/dL (ref 0.0–149.0)
VLDL: 20 mg/dL (ref 0.0–40.0)

## 2022-12-18 ENCOUNTER — Ambulatory Visit (INDEPENDENT_AMBULATORY_CARE_PROVIDER_SITE_OTHER): Payer: Medicare Other

## 2022-12-18 VITALS — Ht 69.0 in | Wt 168.0 lb

## 2022-12-18 DIAGNOSIS — Z0000 Encounter for general adult medical examination without abnormal findings: Secondary | ICD-10-CM

## 2022-12-18 NOTE — Patient Instructions (Signed)
Wendy Pruitt , Thank you for taking time to come for your Medicare Wellness Visit. I appreciate your ongoing commitment to your health goals. Please review the following plan we discussed and let me know if I can assist you in the future.   These are the goals we discussed:  Goals      Patient Stated     12/14/2020, I will continue to play golf 3-5 times a week. I will walk 5 days a week for about 1 hour.     Patient Stated     Would like to continue to work on drinking more, water and exercise.      Patient Stated     Eat healthy.        This is a list of the screening recommended for you and due dates:  Health Maintenance  Topic Date Due   Pneumonia Vaccine (1 of 2 - PCV) Never done   Zoster (Shingles) Vaccine (1 of 2) Never done   DTaP/Tdap/Td vaccine (2 - Tdap) 11/06/2007   DEXA scan (bone density measurement)  Never done   COVID-19 Vaccine (3 - Moderna risk series) 01/11/2021   Flu Shot  06/05/2022   Mammogram  08/29/2023   Medicare Annual Wellness Visit  12/19/2023   Cologuard (Stool DNA test)  02/28/2025   Hepatitis C Screening: USPSTF Recommendation to screen - Ages 18-79 yo.  Completed   HPV Vaccine  Aged Out    Advanced directives: Advance directive discussed with you today. Even though you declined this today, please call our office should you change your mind, and we can give you the proper paperwork for you to fill out.   Conditions/risks identified: smoking If you wish to quit smoking, help is available. For free tobacco cessation program offerings call the [facility] at 253 819 2485 or Live Well Line at [phone]. You may also visit [email or web address] or email [email or web address] for more information on other programs.   You may also call 1-800-QUIT-NOW (951)867-2963) or visit www.VirusCrisis.dk or www.BecomeAnEx.org for additional resources on smoking cessation.    Next appointment: Follow up in one year for your annual wellness  visit 12/23/2023 @ 1:00 via telephone.   Preventive Care 94 Years and Older, Female Preventive care refers to lifestyle choices and visits with your health care provider that can promote health and wellness. What does preventive care include? A yearly physical exam. This is also called an annual well check. Dental exams once or twice a year. Routine eye exams. Ask your health care provider how often you should have your eyes checked. Personal lifestyle choices, including: Daily care of your teeth and gums. Regular physical activity. Eating a healthy diet. Avoiding tobacco and drug use. Limiting alcohol use. Practicing safe sex. Taking low-dose aspirin every day. Taking vitamin and mineral supplements as recommended by your health care provider. What happens during an annual well check? The services and screenings done by your health care provider during your annual well check will depend on your age, overall health, lifestyle risk factors, and family history of disease. Counseling  Your health care provider may ask you questions about your: Alcohol use. Tobacco use. Drug use. Emotional well-being. Home and relationship well-being. Sexual activity. Eating habits. History of falls. Memory and ability to understand (cognition). Work and work Statistician. Reproductive health. Screening  You may have the following tests or measurements: Height, weight, and BMI. Blood pressure. Lipid and cholesterol levels. These may be checked every 5 years, or more frequently if  you are over 71 years old. Skin check. Lung cancer screening. You may have this screening every year starting at age 15 if you have a 30-pack-year history of smoking and currently smoke or have quit within the past 15 years. Fecal occult blood test (FOBT) of the stool. You may have this test every year starting at age 47. Flexible sigmoidoscopy or colonoscopy. You may have a sigmoidoscopy every 5 years or a colonoscopy  every 10 years starting at age 10. Hepatitis C blood test. Hepatitis B blood test. Sexually transmitted disease (STD) testing. Diabetes screening. This is done by checking your blood sugar (glucose) after you have not eaten for a while (fasting). You may have this done every 1-3 years. Bone density scan. This is done to screen for osteoporosis. You may have this done starting at age 49. Mammogram. This may be done every 1-2 years. Talk to your health care provider about how often you should have regular mammograms. Talk with your health care provider about your test results, treatment options, and if necessary, the need for more tests. Vaccines  Your health care provider may recommend certain vaccines, such as: Influenza vaccine. This is recommended every year. Tetanus, diphtheria, and acellular pertussis (Tdap, Td) vaccine. You may need a Td booster every 10 years. Zoster vaccine. You may need this after age 30. Pneumococcal 13-valent conjugate (PCV13) vaccine. One dose is recommended after age 80. Pneumococcal polysaccharide (PPSV23) vaccine. One dose is recommended after age 76. Talk to your health care provider about which screenings and vaccines you need and how often you need them. This information is not intended to replace advice given to you by your health care provider. Make sure you discuss any questions you have with your health care provider. Document Released: 11/18/2015 Document Revised: 07/11/2016 Document Reviewed: 08/23/2015 Elsevier Interactive Patient Education  2017 [place] Prevention in the Home Falls can cause injuries. They can happen to people of all ages. There are many things you can do to make your home safe and to help prevent falls. What can I do on the outside of my home? Regularly fix the edges of walkways and driveways and fix any cracks. Remove anything that might make you trip as you walk through a door, such as a raised step or threshold. Trim  any bushes or trees on the path to your home. Use bright outdoor lighting. Clear any walking paths of anything that might make someone trip, such as rocks or tools. Regularly check to see if handrails are loose or broken. Make sure that both sides of any steps have handrails. Any raised decks and porches should have guardrails on the edges. Have any leaves, snow, or ice cleared regularly. Use sand or salt on walking paths during winter. Clean up any spills in your garage right away. This includes oil or grease spills. What can I do in the bathroom? Use night lights. Install grab bars by the toilet and in the tub and shower. Do not use towel bars as grab bars. Use non-skid mats or decals in the tub or shower. If you need to sit down in the shower, use a plastic, non-slip stool. Keep the floor dry. Clean up any water that spills on the floor as soon as it happens. Remove soap buildup in the tub or shower regularly. Attach bath mats securely with double-sided non-slip rug tape. Do not have throw rugs and other things on the floor that can make you trip. What can I do  in the bedroom? Use night lights. Make sure that you have a light by your bed that is easy to reach. Do not use any sheets or blankets that are too big for your bed. They should not hang down onto the floor. Have a firm chair that has side arms. You can use this for support while you get dressed. Do not have throw rugs and other things on the floor that can make you trip. What can I do in the kitchen? Clean up any spills right away. Avoid walking on wet floors. Keep items that you use a lot in easy-to-reach places. If you need to reach something above you, use a strong step stool that has a grab bar. Keep electrical cords out of the way. Do not use floor polish or wax that makes floors slippery. If you must use wax, use non-skid floor wax. Do not have throw rugs and other things on the floor that can make you trip. What can I  do with my stairs? Do not leave any items on the stairs. Make sure that there are handrails on both sides of the stairs and use them. Fix handrails that are broken or loose. Make sure that handrails are as long as the stairways. Check any carpeting to make sure that it is firmly attached to the stairs. Fix any carpet that is loose or worn. Avoid having throw rugs at the top or bottom of the stairs. If you do have throw rugs, attach them to the floor with carpet tape. Make sure that you have a light switch at the top of the stairs and the bottom of the stairs. If you do not have them, ask someone to add them for you. What else can I do to help prevent falls? Wear shoes that: Do not have high heels. Have rubber bottoms. Are comfortable and fit you well. Are closed at the toe. Do not wear sandals. If you use a stepladder: Make sure that it is fully opened. Do not climb a closed stepladder. Make sure that both sides of the stepladder are locked into place. Ask someone to hold it for you, if possible. Clearly mark and make sure that you can see: Any grab bars or handrails. First and last steps. Where the edge of each step is. Use tools that help you move around (mobility aids) if they are needed. These include: Canes. Walkers. Scooters. Crutches. Turn on the lights when you go into a dark area. Replace any light bulbs as soon as they burn out. Set up your furniture so you have a clear path. Avoid moving your furniture around. If any of your floors are uneven, fix them. If there are any pets around you, be aware of where they are. Review your medicines with your doctor. Some medicines can make you feel dizzy. This can increase your chance of falling. Ask your doctor what other things that you can do to help prevent falls. This information is not intended to replace advice given to you by your health care provider. Make sure you discuss any questions you have with your health care  provider. Document Released: 08/18/2009 Document Revised: 03/29/2016 Document Reviewed: 11/26/2014 Elsevier Interactive Patient Education  2017 [employer].

## 2022-12-18 NOTE — Progress Notes (Signed)
I connected with  Wendy Pruitt on 12/18/22 by a audio enabled telemedicine application and verified that I am speaking with the correct person using two identifiers.  Patient Location: Home  Provider Location: Office/Clinic  I discussed the limitations of evaluation and management by telemedicine. The patient expressed understanding and agreed to proceed.  Subjective:   Wendy Pruitt is a 67 y.o. female who presents for Medicare Annual (Subsequent) preventive examination.  Review of Systems     Cardiac Risk Factors include: advanced age (>48mn, >>49women)     Objective:    Today's Vitals   12/18/22 1401 12/18/22 1403  Weight: 168 lb (76.2 kg)   Height: 5' 9"$  (1.753 m)   PainSc:  4    Body mass index is 24.81 kg/m.     12/18/2022    2:18 PM 04/10/2022    3:49 PM 12/15/2021    2:08 PM 01/19/2021   10:58 AM 12/14/2020   10:42 AM 11/07/2020   10:28 AM 04/29/2020   11:40 AM  Advanced Directives  Does Patient Have a Medical Advance Directive? No No No No No No No  Does patient want to make changes to medical advance directive?   Yes (MAU/Ambulatory/Procedural Areas - Information given)      Would patient like information on creating a medical advance directive? No - Patient declined    No - Patient declined  No - Patient declined    Current Medications (verified) Outpatient Encounter Medications as of 12/18/2022  Medication Sig   dorzolamide-timolol (COSOPT) 22.3-6.8 MG/ML ophthalmic solution Place 1 drop into the right eye 2 (two) times daily.   eszopiclone (LUNESTA) 2 MG TABS tablet TAKE 1/2 TO 1 TABLET BY MOUTH AT BEDTIME USE SPARINGLY AS NEEDED. INTOLERANT OF TRAZODONE.   ibuprofen (ADVIL) 200 MG tablet Take 600 mg by mouth every 6 (six) hours as needed. BID   latanoprost (XALATAN) 0.005 % ophthalmic solution Place 1 drop into both eyes at bedtime.    aspirin EC 81 MG tablet Take 1 tablet (81 mg total) by mouth daily. Swallow whole. (Patient not taking: Reported on  12/18/2022)   desonide (DESOWEN) 0.05 % cream Apply topically daily. As needed for flares (Patient not taking: Reported on 12/18/2022)   No facility-administered encounter medications on file as of 12/18/2022.    Allergies (verified) Chantix [varenicline], Trazodone and nefazodone, and Wellbutrin [bupropion]   History: Past Medical History:  Diagnosis Date   BRCA gene mutation negative in female    Glaucoma, both eyes    History of left breast cancer 2004--- per pt no recurrence   dx DCIS left breast s/p  total mastectomy w/ reconstruction,  NO chemo or radiation therpy  (ER and PR negative)   History of Paget's disease of breast    Insomnia    PONV (postoperative nausea and vomiting)    VIN II (vulvar intraepithelial neoplasia II)    Past Surgical History:  Procedure Laterality Date   CATARACT EXTRACTION W/ INTRAOCULAR LENS IMPLANT  1990 approx.   " left eye I think"   MASTECTOMY Left    PARTIAL MASTECTOMY INCORPATING NIPPLE AREOLAR COMPLEX Left 12-28-2002   [facility] pag[facility]'s disease left nipple   RE-EXCISION  PORTION OF THE LUMPECTOMY SITE Left 01-11-2003    dr young  [facility]  w/ AXILLARY LYMPH NODE DISSECTION AND IMMEDIATE BREAST RECONSTRUCTION WITH SALINE IMPLANT   VAGINAL HYSTERECTOMY  1983 approx.   VULVECTOMY N/A 02/11/2018   Procedure: WIDE EXCISION VULVECTOMY;  Surgeon: Cheri Fowler, MD;  Location: [facility];  Service: Gynecology;  Laterality: N/A;   Family History  Problem Relation Age of Onset   Heart disease Mother 43       CHF   Heart disease Father        MI   Cancer Sister        lung cancer   Heart disease Sister        heart failure   Breast cancer Daughter 24       triple negative   BRCA 1/2 Daughter        tested negative   Breast cancer Maternal Grandmother 86   Drug abuse Brother        In [place]; (drugs)   Heart disease Brother        CAD (valve surg)   Cancer Brother     Colon cancer Neg Hx    Social History   Socioeconomic History   Marital status: Divorced    Spouse name: Not on file   Number of children: 2   Years of education: Not on file   Highest education level: Not on file  Occupational History   Occupation: Development worker, international aid: UNEMPLOYED  Tobacco Use   Smoking status: Every Day    Packs/day: 0.50    Years: 30.00    Total pack years: 15.00    Types: Cigarettes   Smokeless tobacco: Never  Vaping Use   Vaping Use: Never used  Substance and Sexual Activity   Alcohol use: Never    Alcohol/week: 0.0 standard drinks of alcohol   Drug use: No   Sexual activity: Not on file  Other Topics Concern   Not on file  Social History Narrative   Divorced   2 kids local   As of 2023 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     Social Determinants of Health   Financial Resource Strain: Low Risk  (12/18/2022)   Overall Financial Resource Strain (CARDIA)    Difficulty of Paying Living Expenses: Not hard at all  Food Insecurity: No Food Insecurity (12/18/2022)   Hunger Vital Sign    Worried About Running Out of Food in the Last Year: Never true    Ran Out of Food in the Last Year: Never true  Transportation Needs: No Transportation Needs (12/18/2022)   PRAPARE - Hydrologist (Medical): No    Lack of Transportation (Non-Medical): No  Physical Activity: Sufficiently Active (12/18/2022)   Exercise Vital Sign    Days of Exercise per Week: 7 days    Minutes of Exercise per Session: 30 min  Stress: No Stress Concern Present (12/18/2022)   [place]    Feeling of Stress : Not at all  Social Connections: Moderately Integrated (12/18/2022)   Social Connection and Isolation Panel [NHANES]    Frequency of Communication with Friends and Family: More than three times a week    Frequency of  Social Gatherings with Friends and Family: More than three times a week    Attends Religious Services: More than 4 times per year    Active Member of [employer] or Organizations: Yes    Attends Music therapist: More than 4 times per year    Marital Status: Divorced  Tobacco Counseling Ready to quit: No Counseling given: Yes   Clinical Intake:  Pre-visit preparation completed: Yes  Pain : 0-10 ([facility] following) Pain Score: 4  Pain Type: Acute pain (Bursitis) Pain Location: Shoulder Pain Orientation: Left Pain Descriptors / Indicators: Other (Comment) (Just hurts) Pain Onset: 1 to 4 weeks ago Pain Frequency: Constant Pain Relieving Factors: medication and cool packs Effect of Pain on Daily Activities: none  Pain Relieving Factors: medication and cool packs  Nutritional Risks: None Diabetes: No  How often do you need to have someone help you when you read instructions, pamphlets, or other written materials from your doctor or pharmacy?: 1 - Never  Diabetic?no  Interpreter Needed?: No  Information entered by :: C.Aliany Fiorenza LPN   Activities of Daily Living    12/18/2022    2:20 PM  In your present state of health, do you have any difficulty performing the following activities:  Hearing? 0  Vision? 0  Difficulty concentrating or making decisions? 0  Walking or climbing stairs? 0  Dressing or bathing? 0  Doing errands, shopping? 0  Preparing Food and eating ? N  Using the Toilet? N  In the past six months, have you accidently leaked urine? N  Do you have problems with loss of bowel control? N  Managing your Medications? N  Managing your Finances? N  Housekeeping or managing your Housekeeping? N    Patient Care Team: Tonia Ghent, MD as PCP - General (Family Medicine)  Indicate any recent Medical Services you may have received from other than [facility] providers in the past year (date may be approximate).     Assessment:   This is a routine  wellness examination for Wendy Pruitt.  Hearing/Vision screen Hearing Screening - Comments:: No aids Vision Screening - Comments:: Readers- Dr.Groat  Dietary issues and exercise activities discussed: Current Exercise Habits: Home exercise routine, Type of exercise: walking, Time (Minutes): 30, Frequency (Times/Week): 7, Weekly Exercise (Minutes/Week): 210, Intensity: Mild, Exercise limited by: None identified   Goals Addressed             This Visit's Progress    Patient Stated       Eat healthy.       Depression Screen    12/18/2022    2:14 PM 12/15/2021    2:13 PM 12/14/2020   10:44 AM  PHQ 2/9 Scores  PHQ - 2 Score 0 0 0  PHQ- 9 Score   0    Fall Risk    12/18/2022    2:20 PM 12/15/2021    2:09 PM 01/19/2021   10:57 AM 12/14/2020   10:43 AM  Fall Risk   Falls in the past year? 0 0 0 0  Number falls in past yr: 0 0 0 0  Injury with Fall? 0 0 0 0  Risk for fall due to : No Fall Risks No Fall Risks  No Fall Risks  Follow up Falls prevention discussed;Falls evaluation completed Falls prevention discussed  Falls evaluation completed;Falls prevention discussed    FALL RISK PREVENTION PERTAINING TO THE HOME:  Any stairs in or around the home? No  If so, are there any without handrails? No  Home free of loose throw rugs in walkways, pet beds, electrical cords, etc? Yes  Adequate lighting in your home to reduce risk of falls? Yes   ASSISTIVE DEVICES UTILIZED TO PREVENT FALLS:  Life alert? No  Use of a cane, walker or w/c? No  Grab bars in the bathroom?  No  Shower chair or bench in shower? No  Elevated toilet seat or a handicapped toilet? Yes    Cognitive Function:    12/14/2020   10:48 AM  MMSE - Mini Mental State Exam  Not completed: Unable to complete        12/18/2022    2:30 PM  6CIT Screen  What Year? 0 points  What month? 0 points  What time? 0 points  Count back from 20 0 points  Months in reverse 0 points  Repeat phrase 0 points  Total Score 0 points     Immunizations Immunization History  Administered Date(s) Administered   Influenza Whole 08/19/2020   Influenza-Unspecified 08/08/2014, 08/06/2015, 08/05/2016, 08/05/2017, 08/05/2018, 08/20/2019, 08/05/2021   Moderna Sars-Covid-2 Vaccination 11/16/2020, 12/14/2020   Td 11/05/1997    TDAP status: Due, Education has been provided regarding the importance of this vaccine. Advised may receive this vaccine at local pharmacy or Health Dept. Aware to provide a copy of the vaccination record if obtained from local pharmacy or Health Dept. Verbalized acceptance and understanding.  Flu Vaccine status: Up to date   Pneumococcal vaccine status: Due, Education has been provided regarding the importance of this vaccine. Advised may receive this vaccine at local pharmacy or Health Dept. Aware to provide a copy of the vaccination record if obtained from local pharmacy or Health Dept. Verbalized acceptance and understanding.  Covid-19 vaccine status: Declined, Education has been provided regarding the importance of this vaccine but patient still declined. Advised may receive this vaccine at local pharmacy or Health Dept.or vaccine clinic. Aware to provide a copy of the vaccination record if obtained from local pharmacy or Health Dept. Verbalized acceptance and understanding.  Qualifies for Shingles Vaccine? Yes   Zostavax completed No   Shingrix Completed?: No.    Education has been provided regarding the importance of this vaccine. Patient has been advised to call insurance company to determine out of pocket expense if they have not yet received this vaccine. Advised may also receive vaccine at local pharmacy or Health Dept. Verbalized acceptance and understanding.  Screening Tests Health Maintenance  Topic Date Due   Pneumonia Vaccine 43+ Years old (1 of 2 - PCV) Never done   Zoster Vaccines- Shingrix (1 of 2) Never done   DTaP/Tdap/Td (2 - Tdap) 11/06/2007   DEXA SCAN  Never done   COVID-19  Vaccine (3 - Moderna risk series) 01/11/2021   INFLUENZA VACCINE  06/05/2022   MAMMOGRAM  08/29/2023   Medicare Annual Wellness (AWV)  12/19/2023   Fecal DNA (Cologuard)  02/28/2025   Hepatitis C Screening  Completed   HPV VACCINES  Aged Out    Health Maintenance  Health Maintenance Due  Topic Date Due   Pneumonia Vaccine 21+ Years old (1 of 2 - PCV) Never done   Zoster Vaccines- Shingrix (1 of 2) Never done   DTaP/Tdap/Td (2 - Tdap) 11/06/2007   DEXA SCAN  Never done   COVID-19 Vaccine (3 - Moderna risk series) 01/11/2021   INFLUENZA VACCINE  06/05/2022   Had Abnormal cologuard 03/09/2022 pt declined colonoscopy.  Mammogram status: Completed 08/28/2022. Repeat every year declined referral.  Bone Density: Pt declined.  Lung Cancer Screening: (Low Dose CT Chest recommended if Age 6-80 years, 30 pack-year currently smoking OR have quit w/in 15years.) does qualify.   Lung Cancer Screening Referral: Pt declines screening.  Additional Screening:  Hepatitis C Screening: does not qualify; Completed 10/12/2016  Vision Screening: Recommended annual ophthalmology exams for early  detection of glaucoma and other disorders of the eye. Is the patient up to date with their annual eye exam?  No  Who is the provider or what is the name of the office in which the patient attends annual eye exams? Dr.Groat If pt is not established with a provider, would they like to be referred to a provider to establish care? No .   Dental Screening: Recommended annual dental exams for proper oral hygiene  Community Resource Referral / Chronic Care Management: CRR required this visit?  No   CCM required this visit?  No      Plan:     I have personally reviewed and noted the following in the patient's chart:   Medical and social history Use of alcohol, tobacco or illicit drugs  Current medications and supplements including opioid prescriptions. Patient is not currently taking opioid  prescriptions. Functional ability and status Nutritional status Physical activity Advanced directives List of other physicians Hospitalizations, surgeries, and ER visits in previous 12 months Vitals Screenings to include cognitive, depression, and falls Referrals and appointments  In addition, I have reviewed and discussed with patient certain preventive protocols, quality metrics, and best practice recommendations. A written personalized care plan for preventive services as well as general preventive health recommendations were provided to patient.     Lebron Conners, LPN   [id number]   Nurse Notes: Pt declined all vaccines and screenings at this time.

## 2022-12-20 ENCOUNTER — Ambulatory Visit (INDEPENDENT_AMBULATORY_CARE_PROVIDER_SITE_OTHER): Payer: Medicare Other | Admitting: Family Medicine

## 2022-12-20 ENCOUNTER — Encounter: Payer: Self-pay | Admitting: Family Medicine

## 2022-12-20 VITALS — BP 118/64 | HR 83 | Temp 97.1°F | Ht 69.0 in | Wt 166.0 lb

## 2022-12-20 DIAGNOSIS — R195 Other fecal abnormalities: Secondary | ICD-10-CM | POA: Diagnosis not present

## 2022-12-20 DIAGNOSIS — G4700 Insomnia, unspecified: Secondary | ICD-10-CM

## 2022-12-20 DIAGNOSIS — Z7189 Other specified counseling: Secondary | ICD-10-CM

## 2022-12-20 DIAGNOSIS — Z0000 Encounter for general adult medical examination without abnormal findings: Secondary | ICD-10-CM

## 2022-12-20 MED ORDER — ESZOPICLONE 2 MG PO TABS
ORAL_TABLET | ORAL | 1 refills | Status: DC
Start: 2022-12-20 — End: 2023-06-18

## 2022-12-20 NOTE — Patient Instructions (Addendum)
I would go see the GI clinic.  Please let me know if you are willing to go.  You could have a polyp that needs to be removed.   Take care.  Glad to see you. Please ask the front about a record release from ortho.

## 2022-12-20 NOTE — Progress Notes (Signed)
Insomnia.  She is able to get 6-7 hours with lunesta.  She doesn't sleep well w/o med use.  No ADE on med.  Compliant. She may end up needing a PA, and if so we'll work on it.  D/w pt.     Flu 2023 Shingles discussed with patient.   PNA vaccine encouraged.  Tetanus 1999 covid vaccine 2022 Cologuard positive 2023.  encourage GI f/u, d/w pt.  Advised GI eval- specifically d/w pt about possible GI malignancy and rationale for colonoscopy.   See AVS.   Breast cancer screening 2023 Bone density test discussed with patient.  Encouraged. Declined.   She is still seeing GYN clinic.  D/w pt.   Advance directive. Would have her daughter Joya Salm if patient were incapacitated.  Smoking d/w pt.  Encouraged cessation. Averaging about ~1/2 PPD or less.  She had been gradually cutting back.    Diet and exercise d/w pt.  She is walking.  Her diet was off over the last few months but she started back on healthier foods in the last month.  Labs d/w pt.    She had seen ortho about her L shoulder.    Glaucoma per eye clinic.  I will defer.  She agrees.  Meds, vitals, and allergies reviewed.   ROS: Per HPI unless specifically indicated in ROS section   GEN: nad, alert and oriented HEENT: ncat NECK: supple w/o LA CV: rrr. PULM: ctab, no inc wob ABD: soft, +bs EXT: no edema SKIN: no acute rash  30 minutes were devoted to patient care in this encounter (this includes time spent reviewing the patient's file/history, interviewing and examining the patient, counseling/reviewing plan with patient).

## 2022-12-23 DIAGNOSIS — R195 Other fecal abnormalities: Secondary | ICD-10-CM | POA: Insufficient documentation

## 2022-12-23 NOTE — Assessment & Plan Note (Signed)
She is able to get 6-7 hours with lunesta.  She doesn't sleep well w/o med use.  No ADE on med.  Compliant. She may end up needing a PA, and if so we'll work on it.  D/w pt.   continue Lunesta as is.

## 2022-12-23 NOTE — Assessment & Plan Note (Signed)
Advance directive. Would have her daughter Joya Salm if patient were incapacitated.

## 2022-12-23 NOTE — Assessment & Plan Note (Signed)
Flu 2023 Shingles discussed with patient.   PNA vaccine encouraged.  Tetanus 1999 covid vaccine 2022 Cologuard positive 2023.  encourage GI f/u, d/w pt.  Advised GI eval- specifically d/w pt about possible GI malignancy and rationale for colonoscopy.   See AVS.   Breast cancer screening 2023 Bone density test discussed with patient.  Encouraged. Declined.   She is still seeing GYN clinic.  D/w pt.   Advance directive. Would have her daughter Joya Salm if patient were incapacitated.  Smoking d/w pt.  Encouraged cessation. Averaging about ~1/2 PPD or less.  She had been gradually cutting back.

## 2022-12-23 NOTE — Assessment & Plan Note (Signed)
Cologuard positive 2023.  Encourage GI f/u, d/w pt.  Advised GI eval- specifically d/w pt about possible GI malignancy and rationale for colonoscopy.   See AVS.   this was a detailed conversation and I politely and clearly advised her to follow-up with GI given her results.  Rationale clearly discussed with patient.  It is AGAINST MEDICAL ADVICE not to follow-up with the GI clinic.

## 2023-04-03 ENCOUNTER — Ambulatory Visit: Payer: Medicare Other | Admitting: Dermatology

## 2023-04-03 VITALS — BP 127/78 | HR 63

## 2023-04-03 DIAGNOSIS — Z79899 Other long term (current) drug therapy: Secondary | ICD-10-CM

## 2023-04-03 DIAGNOSIS — L0100 Impetigo, unspecified: Secondary | ICD-10-CM

## 2023-04-03 DIAGNOSIS — L821 Other seborrheic keratosis: Secondary | ICD-10-CM

## 2023-04-03 DIAGNOSIS — L578 Other skin changes due to chronic exposure to nonionizing radiation: Secondary | ICD-10-CM | POA: Diagnosis not present

## 2023-04-03 DIAGNOSIS — W908XXA Exposure to other nonionizing radiation, initial encounter: Secondary | ICD-10-CM | POA: Diagnosis not present

## 2023-04-03 DIAGNOSIS — Z7189 Other specified counseling: Secondary | ICD-10-CM

## 2023-04-03 DIAGNOSIS — L700 Acne vulgaris: Secondary | ICD-10-CM

## 2023-04-03 DIAGNOSIS — L568 Other specified acute skin changes due to ultraviolet radiation: Secondary | ICD-10-CM

## 2023-04-03 DIAGNOSIS — L820 Inflamed seborrheic keratosis: Secondary | ICD-10-CM

## 2023-04-03 DIAGNOSIS — X32XXXA Exposure to sunlight, initial encounter: Secondary | ICD-10-CM

## 2023-04-03 MED ORDER — CLINDAMYCIN-TRETINOIN 1.2-0.025 % EX GEL: CUTANEOUS | 2 refills | Status: DC

## 2023-04-03 MED ORDER — MUPIROCIN 2 % EX OINT: TOPICAL_OINTMENT | CUTANEOUS | 1 refills | Status: DC

## 2023-04-03 MED ORDER — CLOBETASOL PROPIONATE 0.05 % EX CREA: TOPICAL_CREAM | CUTANEOUS | 1 refills | Status: DC

## 2023-04-03 NOTE — Progress Notes (Signed)
Follow-Up Visit   Subjective  Wendy Pruitt is a 67 y.o. female who presents for the following: patient c/o sun poisoning on her arm started 3 days ago after she had been out at the pool. Patient c/o bumps on her face.  The patient has spots, moles and lesions to be evaluated, some may be new or changing and the patient may have concern these could be cancer.  The following portions of the chart were reviewed this encounter and updated as appropriate: medications, allergies, medical history  Review of Systems:  No other skin or systemic complaints except as noted in HPI or Assessment and Plan.  Objective  Well appearing patient in no apparent distress; mood and affect are within normal limits. A focused examination was performed of the following areas: Relevant exam findings are noted in the Assessment and Plan.  arms Scaly erythematous papules and plaques +/- edema and vesiculation.   left forearm x 2 (2) Stuck-on, waxy, tan-brown papules --Discussed benign etiology and prognosis.    Assessment & Plan   SEBORRHEIC KERATOSIS - Stuck-on, waxy, tan-brown papules and/or plaques  - Benign-appearing - Discussed benign etiology and prognosis. - Observe - Call for any changes  ACTINIC DAMAGE - chronic, secondary to cumulative UV radiation exposure/sun exposure over time - diffuse scaly erythematous macules with underlying dyspigmentation - Recommend daily broad spectrum sunscreen SPF 30+ to sun-exposed areas, reapply every 2 hours as needed.  - Recommend staying in the shade or wearing long sleeves, sun glasses (UVA+UVB protection) and wide brim hats (4-inch brim around the entire circumference of the hat). - Call for new or changing lesions.   Photodermatitis due to sun arms  Start Clobetasol cream apply to affected skin qd-bid 30 gm   Topical steroids (such as triamcinolone, fluocinolone, fluocinonide, mometasone, clobetasol, halobetasol, betamethasone, hydrocortisone) can  cause thinning and lightening of the skin if they are used for too long in the same area. Your physician has selected the right strength medicine for your problem and area affected on the body. Please use your medication only as directed by your physician to prevent side effects.    Inflamed seborrheic keratosis (2) left forearm x 2  Symptomatic, irritating, patient would like treated.   Destruction of lesion - left forearm x 2 Complexity: simple   Destruction method: cryotherapy   Informed consent: discussed and consent obtained   Timeout:  patient name, date of birth, surgical site, and procedure verified Lesion destroyed using liquid nitrogen: Yes   Region frozen until ice ball extended beyond lesion: Yes   Outcome: patient tolerated procedure well with no complications   Post-procedure details: wound care instructions given    ACNE VULGARIS Exam: Mila on the cheek  Chronic and persistent condition with duration or expected duration over one year. Condition is symptomatic/ bothersome to patient. Not currently at goal.  Treatment Plan: Start Veltin apply to face at bedtime   Impetigo Crusted papule on the right cheek  Impetigo is a superficial bacterial infection of the skin causing crusty nonhealing sores, commonly on the face and neck.  It is contagious and easily spread to different places on the body or to close contacts.  It is treated with topical and sometimes oral antibiotics.  Avoid picking/scratching sores.  Recommend washing hands after touching areas.  When cleansing areas with soap and water, it is recommended to use a new washcloth or disposable cloth each time to prevent spread.   Start Mupirocin ointment apply to crusted papule  Return if symptoms worsen or fail to improve.  IAngelique Holm, CMA, am acting as scribe for Armida Sans, MD .   Documentation: I have reviewed the above documentation for accuracy and completeness, and I agree with the above.  Armida Sans, MD

## 2023-04-03 NOTE — Patient Instructions (Signed)
Due to recent changes in healthcare laws, you may see results of your pathology and/or laboratory studies on MyChart before the doctors have had a chance to review them. We understand that in some cases there may be results that are confusing or concerning to you. Please understand that not all results are received at the same time and often the doctors may need to interpret multiple results in order to provide you with the best plan of care or course of treatment. Therefore, we ask that you please give us 2 business days to thoroughly review all your results before contacting the office for clarification. Should we see a critical lab result, you will be contacted sooner.   If You Need Anything After Your Visit  If you have any questions or concerns for your doctor, please call our main line at [phone] and press option 4 to reach your doctor's medical assistant. If no one answers, please leave a voicemail as directed and we will return your call as soon as possible. Messages left after 4 pm will be answered the following business day.   You may also send us a message via MyChart. We typically respond to MyChart messages within 1-2 business days.  For prescription refills, please ask your pharmacy to contact our office. Our fax number is [phone].  If you have an urgent issue when the clinic is closed that cannot wait until the next business day, you can page your doctor at the number below.    Please note that while we do our best to be available for urgent issues outside of office hours, we are not available 24/7.   If you have an urgent issue and are unable to reach us, you may choose to seek medical care at your doctor's office, retail clinic, urgent care center, or emergency room.  If you have a medical emergency, please immediately call 911 or go to the emergency department.  Pager Numbers  - Dr. Kowalski: [phone]  - Dr. Moye: [phone]  - Dr. Stewart:  [phone]  In the event of inclement weather, please call our main line at [phone] for an update on the status of any delays or closures.  Dermatology Medication Tips: Please keep the boxes that topical medications come in in order to help keep track of the instructions about where and how to use these. Pharmacies typically print the medication instructions only on the boxes and not directly on the medication tubes.   If your medication is too expensive, please contact our office at [phone] option 4 or send us a message through MyChart.   We are unable to tell what your co-pay for medications will be in advance as this is different depending on your insurance coverage. However, we may be able to find a substitute medication at lower cost or fill out paperwork to get insurance to cover a needed medication.   If a prior authorization is required to get your medication covered by your insurance company, please allow us 1-2 business days to complete this process.  Drug prices often vary depending on where the prescription is filled and some pharmacies may offer cheaper prices.  The website www.goodrx.com contains coupons for medications through different pharmacies. The prices here do not account for what the cost may be with help from insurance (it may be cheaper with your insurance), but the website can give you the price if you did not use any insurance.  - You can print the associated coupon and take it with   your prescription to the pharmacy.  - You may also stop by our office during regular business hours and pick up a GoodRx coupon card.  - If you need your prescription sent electronically to a different pharmacy, notify our office through [place] MyChart or by phone at [phone] option 4.     Si Usted Necesita Algo Despus de Su Visita  Tambin puede enviarnos un mensaje a travs de MyChart. Por lo general respondemos a los mensajes de MyChart en el transcurso de 1 a 2  das hbiles.  Para renovar recetas, por favor pida a su farmacia que se ponga en contacto con nuestra oficina. Nuestro nmero de fax es el [phone].  Si tiene un asunto urgente cuando la clnica est cerrada y que no puede esperar hasta el siguiente da hbil, puede llamar/localizar a su doctor(a) al nmero que aparece a continuacin.   Por favor, tenga en cuenta que aunque hacemos todo lo posible para estar disponibles para asuntos urgentes fuera del horario de oficina, no estamos disponibles las 24 horas del da, los 7 das de la semana.   Si tiene un problema urgente y no puede comunicarse con nosotros, puede optar por buscar atencin mdica  en el consultorio de su doctor(a), en una clnica privada, en un centro de atencin urgente o en una sala de emergencias.  Si tiene una emergencia mdica, por favor llame inmediatamente al 911 o vaya a la sala de emergencias.  Nmeros de bper  - Dr. Kowalski: [phone]  - Dra. Moye: [phone]  - Dra. Stewart: [phone]  En caso de inclemencias del tiempo, por favor llame a nuestra lnea principal al [phone] para una actualizacin sobre el estado de cualquier retraso o cierre.  Consejos para la medicacin en dermatologa: Por favor, guarde las cajas en las que vienen los medicamentos de uso tpico para ayudarle a seguir las instrucciones sobre dnde y cmo usarlos. Las farmacias generalmente imprimen las instrucciones del medicamento slo en las cajas y no directamente en los tubos del medicamento.   Si su medicamento es muy caro, por favor, pngase en contacto con nuestra oficina llamando al [phone] y presione la opcin 4 o envenos un mensaje a travs de MyChart.   No podemos decirle cul ser su copago por los medicamentos por adelantado ya que esto es diferente dependiendo de la cobertura de su seguro. Sin embargo, es posible que podamos encontrar un medicamento sustituto a menor costo o llenar un formulario para que el  seguro cubra el medicamento que se considera necesario.   Si se requiere una autorizacin previa para que su compaa de seguros cubra su medicamento, por favor permtanos de 1 a 2 das hbiles para completar este proceso.  Los precios de los medicamentos varan con frecuencia dependiendo del lugar de dnde se surte la receta y alguna farmacias pueden ofrecer precios ms baratos.  El sitio web www.goodrx.com tiene cupones para medicamentos de diferentes farmacias. Los precios aqu no tienen en cuenta lo que podra costar con la ayuda del seguro (puede ser ms barato con su seguro), pero el sitio web puede darle el precio si no utiliz ningn seguro.  - Puede imprimir el cupn correspondiente y llevarlo con su receta a la farmacia.  - Tambin puede pasar por nuestra oficina durante el horario de atencin regular y recoger una tarjeta de cupones de GoodRx.  - Si necesita que su receta se enve electrnicamente a una farmacia diferente, informe a nuestra oficina a travs de MyChart de [place]   o por telfono llamando al [phone] y presione la opcin 4.  

## 2023-04-10 ENCOUNTER — Encounter: Payer: Self-pay | Admitting: Dermatology

## 2023-06-17 ENCOUNTER — Other Ambulatory Visit: Payer: Self-pay | Admitting: Family Medicine

## 2023-06-17 NOTE — Telephone Encounter (Signed)
Refill request for  ESZOPICLONE 2 MG TABLET   LOV - 12/20/22 Next OV - not scheduled Last refill - 12/20/22 #90/1

## 2023-07-17 ENCOUNTER — Encounter: Payer: Self-pay | Admitting: Dermatology

## 2023-07-17 ENCOUNTER — Ambulatory Visit: Payer: Medicare Other | Admitting: Dermatology

## 2023-07-17 VITALS — BP 123/82

## 2023-07-17 DIAGNOSIS — L814 Other melanin hyperpigmentation: Secondary | ICD-10-CM

## 2023-07-17 DIAGNOSIS — L650 Telogen effluvium: Secondary | ICD-10-CM

## 2023-07-17 DIAGNOSIS — Z7189 Other specified counseling: Secondary | ICD-10-CM

## 2023-07-17 DIAGNOSIS — W908XXA Exposure to other nonionizing radiation, initial encounter: Secondary | ICD-10-CM | POA: Diagnosis not present

## 2023-07-17 DIAGNOSIS — L578 Other skin changes due to chronic exposure to nonionizing radiation: Secondary | ICD-10-CM

## 2023-07-17 DIAGNOSIS — L821 Other seborrheic keratosis: Secondary | ICD-10-CM

## 2023-07-17 DIAGNOSIS — D229 Melanocytic nevi, unspecified: Secondary | ICD-10-CM

## 2023-07-17 DIAGNOSIS — Z1283 Encounter for screening for malignant neoplasm of skin: Secondary | ICD-10-CM | POA: Diagnosis not present

## 2023-07-17 DIAGNOSIS — D1801 Hemangioma of skin and subcutaneous tissue: Secondary | ICD-10-CM

## 2023-07-17 DIAGNOSIS — I781 Nevus, non-neoplastic: Secondary | ICD-10-CM

## 2023-07-17 NOTE — Patient Instructions (Addendum)
Telogen effluvium is a benign, self-limited condition causing increased hair shedding usually for several months. It does not progress to baldness, and the hair eventually grows back on its own. It can be triggered by recent illness, recent surgery, thyroid disease, low iron stores, vitamin D deficiency, fad diets or rapid weight loss, hormonal changes such as pregnancy or birth control pills, and some medication. Usually the hair loss starts 2-3 months after the illness or health change. Rarely, it can continue for longer than a year. Treatments options may include oral or topical Minoxidil; Red Light scalp treatments; Biotin 2.5 mg daily and other options.   Due to recent changes in healthcare laws, you may see results of your pathology and/or laboratory studies on MyChart before the doctors have had a chance to review them. We understand that in some cases there may be results that are confusing or concerning to you. Please understand that not all results are received at the same time and often the doctors may need to interpret multiple results in order to provide you with the best plan of care or course of treatment. Therefore, we ask that you please give [place] 2 business days to thoroughly review all your results before contacting the office for clarification. Should we see a critical lab result, you will be contacted sooner.   If You Need Anything After Your Visit  If you have any questions or concerns for your doctor, please call our main line at [phone] and press option 4 to reach your doctor's medical assistant. If no one answers, please leave a voicemail as directed and we will return your call as soon as possible. Messages left after 4 pm will be answered the following business day.   You may also send [place] a message via MyChart. We typically respond to MyChart messages within 1-2 business days.  For prescription refills, please ask your pharmacy to contact our office. Our fax number is  [phone].  If you have an urgent issue when the clinic is closed that cannot wait until the next business day, you can page your doctor at the number below.    Please note that while we do our best to be available for urgent issues outside of office hours, we are not available 24/7.   If you have an urgent issue and are unable to reach [place], you may choose to seek medical care at your doctor's office, retail clinic, urgent care center, or emergency room.  If you have a medical emergency, please immediately call 911 or go to the emergency department.  Pager Numbers  - Dr. Gwen Pounds: [phone]  - Dr. Roseanne Reno: [phone]  - Dr. Katrinka Blazing: [phone]   In the event of inclement weather, please call our main line at [phone] for an update on the status of any delays or closures.  Dermatology Medication Tips: Please keep the boxes that topical medications come in in order to help keep track of the instructions about where and how to use these. Pharmacies typically print the medication instructions only on the boxes and not directly on the medication tubes.   If your medication is too expensive, please contact our office at [phone] option 4 or send [place] a message through MyChart.   We are unable to tell what your co-pay for medications will be in advance as this is different depending on your insurance coverage. However, we may be able to find a substitute medication at lower cost or fill out paperwork to get insurance to cover a  needed medication.   If a prior authorization is required to get your medication covered by your insurance company, please allow [place] 1-2 business days to complete this process.  Drug prices often vary depending on where the prescription is filled and some pharmacies may offer cheaper prices.  The website www.goodrx.com contains coupons for medications through different pharmacies. The prices here do not account for what the cost may be with help from  insurance (it may be cheaper with your insurance), but the website can give you the price if you did not use any insurance.  - You can print the associated coupon and take it with your prescription to the pharmacy.  - You may also stop by our office during regular business hours and pick up a GoodRx coupon card.  - If you need your prescription sent electronically to a different pharmacy, notify our office through [facility] or by phone at [phone] option 4.     Si Usted Necesita Algo Despus de Su Visita  Tambin puede enviarnos un mensaje a travs de Clinical cytogeneticist. Por lo general respondemos a los mensajes de MyChart en el transcurso de 1 a 2 das hbiles.  Para renovar recetas, por favor pida a su farmacia que se ponga en contacto con nuestra oficina. Annie Sable de fax es [place] [phone].  Si tiene un asunto urgente cuando la clnica est cerrada y que no puede esperar hasta el siguiente da hbil, puede llamar/localizar a su doctor(a) al nmero que aparece a continuacin.   Por favor, tenga en cuenta que aunque hacemos todo lo posible para estar disponibles para asuntos urgentes fuera del horario de [place], no estamos disponibles las 24 horas del da, los 7 [address]  [address] de la [place].   Si tiene un problema urgente y no puede comunicarse con nosotros, puede optar por buscar atencin mdica  en el consultorio de su doctor(a), en una clnica privada, en un centro de atencin urgente o en una sala de emergencias.  Si tiene Engineer, drilling, por favor llame inmediatamente al 911 o vaya a la sala de emergencias.  Nmeros de bper  - Dr. Gwen Pounds: [phone]  - Dra. Roseanne Reno: [phone]  - Dr. Katrinka Blazing: [phone]   En caso de inclemencias del tiempo, por favor llame a Lacy Duverney principal al [phone] para una actualizacin sobre el [place] de cualquier retraso o cierre.  Consejos para la medicacin en dermatologa: Por favor, guarde las cajas en las que vienen los  medicamentos de uso tpico para ayudarle a seguir las instrucciones sobre dnde y cmo usarlos. Las farmacias generalmente imprimen las instrucciones del medicamento slo en las cajas y no directamente en los tubos del [place].   Si su medicamento es muy caro, por favor, pngase en contacto con Rolm Gala llamando al [phone] y presione la opcin 4 o envenos un mensaje a travs de Clinical cytogeneticist.   No podemos decirle cul ser su copago por los medicamentos por adelantado ya que esto es diferente dependiendo de la cobertura de su seguro. Sin embargo, es posible que podamos encontrar un medicamento sustituto a Audiological scientist un formulario para que el seguro cubra el medicamento que se considera necesario.   Si se requiere una autorizacin previa para que su compaa de seguros [place] su medicamento, por favor permtanos de 1 a 2 das hbiles para completar [address]  Los precios de los medicamentos varan con frecuencia dependiendo del Environmental consultant de dnde se surte la receta y alguna farmacias pueden ofrecer precios ms baratos.  El sitio web www.goodrx.com tiene cupones para medicamentos de Health and safety inspector. Los precios aqu no tienen en cuenta lo que podra costar con la ayuda del seguro (puede ser ms barato con su seguro), pero el sitio web puede darle el precio si no utiliz Tourist information centre manager.  - Puede imprimir el cupn correspondiente y llevarlo con su receta a la farmacia.  - Tambin puede pasar por nuestra oficina durante el horario de atencin regular y Education officer, museum una tarjeta de cupones de GoodRx.  - Si necesita que su receta se enve electrnicamente a una farmacia diferente, informe a nuestra oficina a travs de MyChart de [place] o por telfono llamando al [phone] y presione la opcin 4.

## 2023-07-17 NOTE — Progress Notes (Signed)
Follow-Up Visit   Subjective  Wendy Pruitt is a 67 y.o. female who presents for the following: Skin Cancer Screening and Full Body Skin Exam - No history of skin cancer or abnormal moles  She feels like has been losing more hair than normal. She did lose about 26 pounds over a 6 month period.  The patient presents for Total-Body Skin Exam (TBSE) for skin cancer screening and mole check. The patient has spots, moles and lesions to be evaluated, some may be new or changing and the patient may have concern these could be cancer.    The following portions of the chart were reviewed this encounter and updated as appropriate: medications, allergies, medical history  Review of Systems:  No other skin or systemic complaints except as noted in HPI or Assessment and Plan.  Objective  Well appearing patient in no apparent distress; mood and affect are within normal limits.  A full examination was performed including scalp, head, eyes, ears, nose, lips, neck, chest, axillae, abdomen, back, buttocks, bilateral upper extremities, bilateral lower extremities, hands, feet, fingers, toes, fingernails, and toenails. All findings within normal limits unless otherwise noted below.   Relevant physical exam findings are noted in the Assessment and Plan.    Assessment & Plan   SKIN CANCER SCREENING PERFORMED TODAY.  ACTINIC DAMAGE - Chronic condition, secondary to cumulative UV/sun exposure - diffuse scaly erythematous macules with underlying dyspigmentation - Recommend daily broad spectrum sunscreen SPF 30+ to sun-exposed areas, reapply every 2 hours as needed.  - Staying in the shade or wearing long sleeves, sun glasses (UVA+UVB protection) and wide brim hats (4-inch brim around the entire circumference of the hat) are also recommended for sun protection.  - Call for new or changing lesions.  LENTIGINES, SEBORRHEIC KERATOSES, HEMANGIOMAS - Benign normal skin lesions - Benign-appearing - Call  for any changes  MELANOCYTIC NEVI - Tan-brown and/or pink-flesh-colored symmetric macules and papules - Benign appearing on exam today - Observation - Call clinic for new or changing moles - Recommend daily use of broad spectrum spf 30+ sunscreen to sun-exposed areas.   Spider Veins - Dilated blue, purple or red veins at the lower extremities - Reassured - Smaller vessels can be treated by sclerotherapy (a procedure to inject a medicine into the veins to make them disappear) if desired, but the treatment is not covered by insurance. Larger vessels may be covered if symptomatic and we would refer to vascular surgeon if treatment desired.   TELOGEN EFFLUVIUM likely related to her recent weight loss -  Exam: Diffuse thinning of hair.  Telogen effluvium is a benign, self-limited condition causing increased hair shedding usually for several months. It does not progress to baldness, and the hair eventually grows back on its own. It can be triggered by recent illness, recent surgery, thyroid disease, low iron stores, vitamin D deficiency, fad diets or rapid weight loss, hormonal changes such as pregnancy or birth control pills, and some medication. Usually the hair loss starts 2-3 months after the illness or health change. Rarely, it can continue for longer than a year. Treatments options may include oral or topical Minoxidil; Red Light scalp treatments; Biotin 2.5 mg daily and other options.  Treatment Plan: Discussed starting Minoxidil - patient prefers to wait since she has seen a little improvement in the last 2 weeks.  Thyroid panel ordered today.  Telogen effluvium  Related Procedures Thyroid Panel With TSH   Return in about 1 year (around 07/16/2024) for TBSE.  I, Joanie Coddington, CMA, am acting as scribe for Armida Sans, MD .   Documentation: I have reviewed the above documentation for accuracy and completeness, and I agree with the above.  Armida Sans, MD

## 2023-07-19 ENCOUNTER — Encounter: Payer: Self-pay | Admitting: Dermatology

## 2023-09-11 ENCOUNTER — Other Ambulatory Visit: Payer: Self-pay | Admitting: Family Medicine

## 2023-09-11 DIAGNOSIS — Z1231 Encounter for screening mammogram for malignant neoplasm of breast: Secondary | ICD-10-CM

## 2023-09-12 ENCOUNTER — Ambulatory Visit
Admission: RE | Admit: 2023-09-12 | Discharge: 2023-09-12 | Disposition: A | Discharge status: Home / Self Care | Payer: Medicare Other | Source: Ambulatory Visit | Attending: Family Medicine | Admitting: Family Medicine

## 2023-09-12 DIAGNOSIS — Z1231 Encounter for screening mammogram for malignant neoplasm of breast: Secondary | ICD-10-CM

## 2023-11-08 DIAGNOSIS — M25512 Pain in left shoulder: Secondary | ICD-10-CM | POA: Diagnosis not present

## 2023-11-24 ENCOUNTER — Encounter: Payer: Self-pay | Admitting: Emergency Medicine

## 2023-11-24 ENCOUNTER — Ambulatory Visit
Admission: EM | Admit: 2023-11-24 | Discharge: 2023-11-24 | Disposition: A | Discharge status: Home / Self Care | Payer: PPO | Attending: Emergency Medicine | Admitting: Emergency Medicine

## 2023-11-24 DIAGNOSIS — J069 Acute upper respiratory infection, unspecified: Secondary | ICD-10-CM

## 2023-11-24 MED ORDER — PREDNISONE 10 MG (21) PO TBPK: ORAL_TABLET | Freq: Every day | ORAL | 0 refills | Status: DC

## 2023-11-24 NOTE — Discharge Instructions (Signed)
Your symptoms today are most likely being caused by a virus and should steadily improve in time it can take up to 7 to 10 days before you truly start to see a turnaround however things will get better  The Mucinex, pseudoephedrine and Tylenol that you have brought in are okay for you to attempt use for management of your sinuses, use consistently for 2 to 3 days to determine effectiveness  If ineffective he may begin use of prednisone which reduces inflammation and pain to help manage her sinus symptoms    You can take Tylenol and/or Ibuprofen as needed for fever reduction and pain relief.   For cough: honey 1/2 to 1 teaspoon (you can dilute the honey in water or another fluid).  You can also use guaifenesin and dextromethorphan for cough. You can use a humidifier for chest congestion and cough.  If you don't have a humidifier, you can sit in the bathroom with the hot shower running.      For sore throat: try warm salt water gargles, cepacol lozenges, throat spray, warm tea or water with lemon/honey, popsicles or ice, or OTC cold relief medicine for throat discomfort.   For congestion: take a daily anti-histamine like Zyrtec, Claritin, and a oral decongestant, such as pseudoephedrine.  You can also use Flonase 1-2 sprays in each nostril daily.   It is important to stay hydrated: drink plenty of fluids (water, gatorade/powerade/pedialyte, juices, or teas) to keep your throat moisturized and help further relieve irritation/discomfort.

## 2023-11-24 NOTE — ED Provider Notes (Signed)
Wendy Pruitt    CSN: [id number] Arrival date & time: 11/24/23  1601      History   Chief Complaint Chief Complaint  Patient presents with   Nasal Congestion   Sore Throat   Cough   sinus pressure     HPI Wendy Pruitt is a 68 y.o. female.   Patient presents for evaluation of subjective fever, chills, mild nonproductive cough, nasal congestion, rhinorrhea and sinus pressure causing the teeth to hurt beginning 1 day ago.  Associated watery eyes but denies drainage, pain or pruritus.  No known sick contacts prior.  Endorses believes this is a sinus infection that reoccurs once a year.  Has attempted use of Tylenol.   Past Medical History:  Diagnosis Date   BRCA gene mutation negative in female    Glaucoma, both eyes    History of left breast cancer 2004--- per pt no recurrence   dx DCIS left breast s/p  total mastectomy w/ reconstruction,  NO chemo or radiation therpy  (ER and PR negative)   History of Paget's disease of breast    Insomnia    PONV (postoperative nausea and vomiting)    VIN II (vulvar intraepithelial neoplasia II)     Patient Active Problem List   Diagnosis Date Noted   Positive colorectal cancer screening using Cologuard test 12/23/2022   Vasovagal episode 04/19/2022   Healthcare maintenance 12/21/2020   Dizziness 11/29/2020   Leg pain 02/17/2020   Aortic atherosclerosis (HCC) 12/07/2019   History of DVT (deep vein thrombosis) 10/21/2019   Ganglion cyst of flexor tendon sheath of finger 08/04/2019   Bilateral hand pain 08/04/2019   Dupuytren's disease of palm 08/04/2019   Dysuria 05/27/2019   Balance problem 05/27/2019   Glaucoma 10/20/2017   Advance care planning 09/08/2014   History of breast cancer in female 03/13/2012   Medicare welcome exam 02/29/2012   Insomnia 02/29/2012   Constipation 02/29/2012   NEOP, MALIGNANT, FEMALE BREAST NOS 06/22/2008   HLD (hyperlipidemia) 06/22/2008   TOBACCO ABUSE 08/15/2007    Past Surgical  History:  Procedure Laterality Date   CATARACT EXTRACTION W/ INTRAOCULAR LENS IMPLANT  1990 approx.   right   MASTECTOMY Left    PARTIAL MASTECTOMY INCORPATING NIPPLE AREOLAR COMPLEX Left 12-28-2002   dr young   [facility]   hx paget's disease left nipple   RE-EXCISION  PORTION OF THE LUMPECTOMY SITE Left 01-11-2003    dr young  [facility]   REMOVE AND REPLACE LENS Left 2023   TOTAL MASTECTOMY Left 02-16-2003  dr young [facility]   w/ AXILLARY LYMPH NODE DISSECTION AND IMMEDIATE BREAST RECONSTRUCTION WITH SALINE IMPLANT   VAGINAL HYSTERECTOMY  1983 approx.   VULVECTOMY N/A 02/11/2018   Procedure: WIDE EXCISION VULVECTOMY;  Surgeon: Lavina Hamman, MD;  Location: [facility];  Service: Gynecology;  Laterality: N/A;    OB History   No obstetric history on file.      Home Medications    Prior to Admission medications   Medication Sig Start Date End Date Taking? Authorizing Provider  predniSONE (STERAPRED UNI-PAK 21 TAB) 10 MG (21) TBPK tablet Take by mouth daily. Take 6 tabs by mouth daily  for 1 days, then 5 tabs for 1 days, then 4 tabs for 1 days, then 3 tabs for 1 days, 2 tabs for 1 days, then 1 tab by mouth daily for 1 days 11/24/23  Yes Resa Rinks, Elita Boone, NP  clindamycin-tretinoin (VELTIN) gel Apply to acne on face at  bedtime 04/03/23   Deirdre Evener, MD  clobetasol cream (TEMOVATE) 0.05 % Apply to rash on arms once or twice a day prn. Avoid applying to face, groin, and axilla. Use as directed. Long-term use can cause thinning of the skin. 04/03/23   Deirdre Evener, MD  dorzolamide-timolol (COSOPT) 22.3-6.8 MG/ML ophthalmic solution Place 1 drop into the right eye 2 (two) times daily. 11/02/19   [provider]  eszopiclone (LUNESTA) 2 MG TABS tablet TAKE 1/2 TO 1 TABLET BY MOUTH AT BEDTIME USE SPARINGLY AS NEEDED. INTOLERANT OF TRAZODONE. 06/18/23   Joaquim Nam, MD  ibuprofen (ADVIL) 200 MG tablet Take 600 mg by mouth every 6 (six) hours as needed. BID    [provider]  latanoprost (XALATAN) 0.005 % ophthalmic solution Place 1 drop into both eyes at bedtime.     [provider]  mupirocin ointment (BACTROBAN) 2 % Apply to crusty area on the right cheek bid 04/03/23   Deirdre Evener, MD    Family History Family History  Problem Relation Age of Onset   Heart disease Mother 55       CHF   Heart disease Father        MI   Cancer Sister        lung cancer   Heart disease Sister        heart failure   Breast cancer Daughter 63       triple negative   BRCA 1/2 Daughter        tested negative   Breast cancer Maternal Grandmother 49   Drug abuse Brother        In [place]; (drugs)   Heart disease Brother        CAD (valve surg)   Cancer Brother    Colon cancer Neg Hx     Social History Social History   Tobacco Use   Smoking status: Every Day    Current packs/day: 0.50    Average packs/day: 0.5 packs/day for 30.0 years (15.0 ttl pk-yrs)    Types: Cigarettes   Smokeless tobacco: Never  Vaping Use   Vaping status: Never Used  Substance Use Topics   Alcohol use: Never    Alcohol/week: 0.0 standard drinks of alcohol   Drug use: No     Allergies   Chantix [varenicline], Trazodone and nefazodone, and Wellbutrin [bupropion]   Review of Systems Review of Systems   Physical Exam Triage Vital Signs ED Triage Vitals  Encounter Vitals Group     BP 11/24/23 0942 126/79     Systolic BP Percentile --      Diastolic BP Percentile --      Pulse Rate 11/24/23 0942 71     Resp 11/24/23 0942 18     Temp 11/24/23 0942 98.4 F (36.9 C)     Temp Source 11/24/23 0942 Oral     SpO2 11/24/23 0942 98 %     Weight --      Height --      Head Circumference --      Peak Flow --      Pain Score 11/24/23 0940 0     Pain Loc --      Pain Education --      Exclude from Growth Chart --    No data found.  Updated Vital Signs BP 126/79 (BP Location: Right Arm)   Pulse 71   Temp 98.4 F (36.9 C) (Oral)   Resp 18  SpO2  98%   Visual Acuity Right Eye Distance:   Left Eye Distance:   Bilateral Distance:    Right Eye Near:   Left Eye Near:    Bilateral Near:     Physical Exam Constitutional:      Appearance: Normal appearance.  HENT:     Head: Normocephalic.     Right Ear: Tympanic membrane, ear canal and external ear normal.     Left Ear: Tympanic membrane, ear canal and external ear normal.     Nose: Congestion and rhinorrhea present.     Right Sinus: No maxillary sinus tenderness or frontal sinus tenderness.     Left Sinus: No maxillary sinus tenderness or frontal sinus tenderness.     Mouth/Throat:     Mouth: Mucous membranes are moist.     Pharynx: Oropharynx is clear. No oropharyngeal exudate or posterior oropharyngeal erythema.  Eyes:     Extraocular Movements: Extraocular movements intact.  Cardiovascular:     Rate and Rhythm: Normal rate and regular rhythm.     Pulses: Normal pulses.     Heart sounds: Normal heart sounds.  Pulmonary:     Effort: Pulmonary effort is normal.     Breath sounds: Normal breath sounds.  Neurological:     Mental Status: She is alert.      UC Treatments / Results  Labs (all labs ordered are listed, but only abnormal results are displayed) Labs Reviewed - No data to display  EKG   Radiology No results found.  Procedures Procedures (including critical care time)  Medications Ordered in UC Medications - No data to display  Initial Impression / Assessment and Plan / UC Course  I have reviewed the triage vital signs and the nursing notes.  Pertinent labs & imaging results that were available during my care of the patient were reviewed by me and considered in my medical decision making (see chart for details).  Viral URI  Patient is in no signs of distress nor toxic appearing.  Vital signs are stable.  Low suspicion for pneumonia, pneumothorax or bronchitis and therefore will defer imaging.  Graciela Husbands COVID and flu testing.  Symptoms consistent  with a sinusitis however only present for 1 day therefore most likely viral, discussed.  Has had success in the past with use of Mucinex, Sudafed and Tylenol therefore recommended initiation of treatment with such and if the counter medications are ineffective prescribed prednisone taper.May use additional over-the-counter medications as needed for supportive care.  May follow-up with urgent care as needed if symptoms persist or worsen.  Final Clinical Impressions(s) / UC Diagnoses   Final diagnoses:  Viral URI     Discharge Instructions      Your symptoms today are most likely being caused by a virus and should steadily improve in time it can take up to 7 to 10 days before you truly start to see a turnaround however things will get better  The Mucinex, pseudoephedrine and Tylenol that you have brought in are okay for you to attempt use for management of your sinuses, use consistently for 2 to 3 days to determine effectiveness  If ineffective he may begin use of prednisone which reduces inflammation and pain to help manage her sinus symptoms    You can take Tylenol and/or Ibuprofen as needed for fever reduction and pain relief.   For cough: honey 1/2 to 1 teaspoon (you can dilute the honey in water or another fluid).  You can also use guaifenesin and  dextromethorphan for cough. You can use a humidifier for chest congestion and cough.  If you don't have a humidifier, you can sit in the bathroom with the hot shower running.      For sore throat: try warm salt water gargles, cepacol lozenges, throat spray, warm tea or water with lemon/honey, popsicles or ice, or OTC cold relief medicine for throat discomfort.   For congestion: take a daily anti-histamine like Zyrtec, Claritin, and a oral decongestant, such as pseudoephedrine.  You can also use Flonase 1-2 sprays in each nostril daily.   It is important to stay hydrated: drink plenty of fluids (water, gatorade/powerade/pedialyte, juices, or  teas) to keep your throat moisturized and help further relieve irritation/discomfort.    ED Prescriptions     Medication Sig Dispense Auth. Provider   predniSONE (STERAPRED UNI-PAK 21 TAB) 10 MG (21) TBPK tablet Take by mouth daily. Take 6 tabs by mouth daily  for 1 days, then 5 tabs for 1 days, then 4 tabs for 1 days, then 3 tabs for 1 days, 2 tabs for 1 days, then 1 tab by mouth daily for 1 days 21 tablet Leen Tworek, Elita Boone, NP      PDMP not reviewed this encounter.   Valinda Hoar, NP 11/24/23 1045

## 2023-11-24 NOTE — ED Triage Notes (Signed)
Pt presents with a some cough, sinus pressure, sore throat and congestion that started last night. Pt has taken tylenol for her symptoms.

## 2023-12-02 ENCOUNTER — Encounter: Payer: Self-pay | Admitting: Family Medicine

## 2023-12-02 ENCOUNTER — Ambulatory Visit (INDEPENDENT_AMBULATORY_CARE_PROVIDER_SITE_OTHER): Payer: PPO | Admitting: Family Medicine

## 2023-12-02 VITALS — BP 120/76 | HR 76 | Temp 98.6°F | Ht 69.0 in | Wt 150.4 lb

## 2023-12-02 DIAGNOSIS — L0100 Impetigo, unspecified: Secondary | ICD-10-CM

## 2023-12-02 MED ORDER — MUPIROCIN 2 % EX OINT: 1.0000 | TOPICAL_OINTMENT | Freq: Two times a day (BID) | CUTANEOUS | 0 refills | Status: DC

## 2023-12-02 NOTE — Progress Notes (Unsigned)
Lior Hoen T. Yale Golla, MD, CAQ Sports Medicine [facility] [address] [place], [address]  Phone: [phone]  FAX: [phone]  Wendy Pruitt - 68 y.o. female  MRN [id number]  Date of Birth: 06-Oct-1956  Date: 12/02/2023  PCP: Joaquim Nam, MD  Referral: Joaquim Nam, MD  Chief Complaint  Patient presents with   Blister    Sore under nose   Subjective:   Wendy Pruitt is a 68 y.o. very pleasant female patient with Body mass index is 22.21 kg/m. who presents with the following:  Sick about eight or nine days ago.  Had a bad cold, sore throat.   Had a lot of nasal draninage.   Went through a lot of tissues.  Now she has some redness on the R inferior nares as visualized below. Mild pain  Has been putting some vaseline.  Review of Systems is noted in the HPI, as appropriate  Objective:   BP 120/76 (BP Location: Right Arm, Patient Position: Sitting, Cuff Size: Normal)   Pulse 76   Temp 98.6 F (37 C) (Temporal)   Ht 5\' 9"  (1.753 m)   Wt 150 lb 6 oz (68.2 kg)   SpO2 97%   BMI 22.21 kg/m   GEN: No acute distress; alert,appropriate. PULM: Breathing comfortably in no respiratory distress PSYCH: Normally interactive.     Nontender to palp No warmth  Laboratory and Imaging Data:  Assessment and Plan:     ICD-10-CM   1. Impetigo  L01.00      Topical skin infection.  I think topicals will work ok.  She will contact me if it worsens or spreads.   If worse, consider oral abx.   Medication Management during today's office visit: Meds ordered this encounter  Medications   mupirocin ointment (BACTROBAN) 2 %    Sig: Apply 1 Application topically 2 (two) times daily.    Dispense:  22 g    Refill:  0   Medications Discontinued During This Encounter  Medication Reason   predniSONE (STERAPRED UNI-PAK 21 TAB) 10 MG (21) TBPK tablet Completed Course   mupirocin ointment (BACTROBAN) 2 %     Orders placed  today for conditions managed today: No orders of the defined types were placed in this encounter.   Disposition: No follow-ups on file.  [employer] One speech-to-text software was used for transcription in this dictation.  Possible transcriptional errors can occur using Animal nutritionist.   Signed,  Elpidio Galea. Satish Hammers, MD   Outpatient Encounter Medications as of 12/02/2023  Medication Sig   clindamycin-tretinoin (VELTIN) gel Apply to acne on face at bedtime   clobetasol cream (TEMOVATE) 0.05 % Apply to rash on arms once or twice a day prn. Avoid applying to face, groin, and axilla. Use as directed. Long-term use can cause thinning of the skin.   dorzolamide-timolol (COSOPT) 22.3-6.8 MG/ML ophthalmic solution Place 1 drop into the right eye 2 (two) times daily.   eszopiclone (LUNESTA) 2 MG TABS tablet TAKE 1/2 TO 1 TABLET BY MOUTH AT BEDTIME USE SPARINGLY AS NEEDED. INTOLERANT OF TRAZODONE.   ibuprofen (ADVIL) 200 MG tablet Take 600 mg by mouth every 6 (six) hours as needed. BID   latanoprost (XALATAN) 0.005 % ophthalmic solution Place 1 drop into both eyes at bedtime.    mupirocin ointment (BACTROBAN) 2 % Apply 1 Application topically 2 (two) times daily.   [DISCONTINUED] mupirocin ointment (BACTROBAN) 2 % Apply to crusty area on  the right cheek bid   [DISCONTINUED] predniSONE (STERAPRED UNI-PAK 21 TAB) 10 MG (21) TBPK tablet Take by mouth daily. Take 6 tabs by mouth daily  for 1 days, then 5 tabs for 1 days, then 4 tabs for 1 days, then 3 tabs for 1 days, 2 tabs for 1 days, then 1 tab by mouth daily for 1 days   No facility-administered encounter medications on file as of 12/02/2023.

## 2023-12-05 DIAGNOSIS — M25512 Pain in left shoulder: Secondary | ICD-10-CM | POA: Diagnosis not present

## 2023-12-05 DIAGNOSIS — M7542 Impingement syndrome of left shoulder: Secondary | ICD-10-CM | POA: Diagnosis not present

## 2023-12-15 ENCOUNTER — Other Ambulatory Visit: Payer: Self-pay | Admitting: Family Medicine

## 2023-12-15 DIAGNOSIS — E785 Hyperlipidemia, unspecified: Secondary | ICD-10-CM

## 2023-12-17 ENCOUNTER — Other Ambulatory Visit: Payer: Self-pay | Admitting: Family Medicine

## 2023-12-17 ENCOUNTER — Other Ambulatory Visit (INDEPENDENT_AMBULATORY_CARE_PROVIDER_SITE_OTHER): Payer: PPO

## 2023-12-17 DIAGNOSIS — E785 Hyperlipidemia, unspecified: Secondary | ICD-10-CM

## 2023-12-17 LAB — COMPREHENSIVE METABOLIC PANEL
ALT: 6 U/L (ref 0–35)
AST: 15 U/L (ref 0–37)
Albumin: 3.9 g/dL (ref 3.5–5.2)
Alkaline Phosphatase: 85 U/L (ref 39–117)
BUN: 17 mg/dL (ref 6–23)
CO2: 30 meq/L (ref 19–32)
Calcium: 8.9 mg/dL (ref 8.4–10.5)
Chloride: 106 meq/L (ref 96–112)
Creatinine, Ser: 0.79 mg/dL (ref 0.40–1.20)
GFR: 76.24 mL/min (ref >60.00)
Glucose, Bld: 96 mg/dL (ref 70–99)
Potassium: 4.1 meq/L (ref 3.5–5.1)
Sodium: 140 meq/L (ref 135–145)
Total Bilirubin: 0.7 mg/dL (ref 0.2–1.2)
Total Protein: 6.8 g/dL (ref 6.0–8.3)

## 2023-12-17 LAB — CBC WITH DIFFERENTIAL/PLATELET
Basophils Absolute: 0 10*3/uL (ref 0.0–0.1)
Basophils Relative: 0.7 % (ref 0.0–3.0)
Eosinophils Absolute: 0.2 10*3/uL (ref 0.0–0.7)
Eosinophils Relative: 3.5 % (ref 0.0–5.0)
HCT: 40.9 % (ref 36.0–46.0)
Hemoglobin: 13.7 g/dL (ref 12.0–15.0)
Lymphocytes Relative: 36.5 % (ref 12.0–46.0)
Lymphs Abs: 2.3 10*3/uL (ref 0.7–4.0)
MCHC: 33.4 g/dL (ref 30.0–36.0)
MCV: 91.3 fL (ref 78.0–100.0)
Monocytes Absolute: 0.6 10*3/uL (ref 0.1–1.0)
Monocytes Relative: 9.8 % (ref 3.0–12.0)
Neutro Abs: 3.1 10*3/uL (ref 1.4–7.7)
Neutrophils Relative %: 49.5 % (ref 43.0–77.0)
Platelets: 345 10*3/uL (ref 150.0–400.0)
RBC: 4.48 Mil/uL (ref 3.87–5.11)
RDW: 13.8 % (ref 11.5–15.5)
WBC: 6.2 10*3/uL (ref 4.0–10.5)

## 2023-12-17 LAB — LIPID PANEL
Cholesterol: 158 mg/dL (ref 0–200)
HDL: 49 mg/dL (ref >39.00)
LDL Cholesterol: 92 mg/dL (ref 0–99)
NonHDL: 109.46
Total CHOL/HDL Ratio: 3
Triglycerides: 87 mg/dL (ref 0.0–149.0)
VLDL: 17.4 mg/dL (ref 0.0–40.0)

## 2023-12-17 NOTE — Telephone Encounter (Signed)
Last office visit: 12/24/23 Next office visit: 12/20/22 Last refill:  ESZOPICLONE 2 MG TABLET 06/18/23 90 tablets 1 refill

## 2023-12-23 ENCOUNTER — Ambulatory Visit (INDEPENDENT_AMBULATORY_CARE_PROVIDER_SITE_OTHER): Payer: PPO

## 2023-12-23 VITALS — Ht 69.0 in | Wt 150.0 lb

## 2023-12-23 DIAGNOSIS — Z0000 Encounter for general adult medical examination without abnormal findings: Secondary | ICD-10-CM

## 2023-12-23 NOTE — Patient Instructions (Addendum)
Wendy Pruitt , Thank you for taking time to come for your Medicare Wellness Visit. I appreciate your ongoing commitment to your health goals. Please review the following plan we discussed and let me know if I can assist you in the future.   Referrals/Orders/Follow-Ups/Clinician Recommendations: none  This is a list of the screening recommended for you and due dates:  Health Maintenance  Topic Date Due   Pneumonia Vaccine (1 of 2 - PCV) Never done   Zoster (Shingles) Vaccine (1 of 2) Never done   DTaP/Tdap/Td vaccine (2 - Tdap) 11/06/2007   DEXA scan (bone density measurement)  Never done   Mammogram  09/11/2024   Medicare Annual Wellness Visit  12/22/2024   Cologuard (Stool DNA test)  02/28/2025   Flu Shot  Completed   Hepatitis C Screening  Completed   HPV Vaccine  Aged Out   COVID-19 Vaccine  Discontinued    Advanced directives: (Declined) Advance directive discussed with you today. Even though you declined this today, please call our office should you change your mind, and we can give you the proper paperwork for you to fill out.  Next Medicare Annual Wellness Visit scheduled for next year: Yes 12/23/24 @1pm  televisit

## 2023-12-23 NOTE — Progress Notes (Signed)
Subjective:   Wendy Pruitt is a 68 y.o. female who presents for Medicare Annual (Subsequent) preventive examination.  Visit Complete: Virtual I connected with  Akua B Milham on 12/23/23 by a audio enabled telemedicine application and verified that I am speaking with the correct person using two identifiers.  Patient Location: Home  Provider Location: Home Office  I discussed the limitations of evaluation and management by telemedicine. The patient expressed understanding and agreed to proceed.  Vital Signs: Because this visit was a virtual/telehealth visit, some criteria may be missing or patient reported. Any vitals not documented were not able to be obtained and vitals that have been documented are patient reported.  Patient Medicare AWV questionnaire was completed by the patient on (not done); I have confirmed that all information answered by patient is correct and no changes since this date.  Cardiac Risk Factors include: advanced age (>65men, >95 women);dyslipidemia     Objective:    Today's Vitals   12/23/23 1304  Weight: 150 lb (68 kg)  Height: 5\' 9"  (1.753 m)   Body mass index is 22.15 kg/m.     12/23/2023    1:16 PM 12/18/2022    2:18 PM 04/10/2022    3:49 PM 12/15/2021    2:08 PM 01/19/2021   10:58 AM 12/14/2020   10:42 AM 11/07/2020   10:28 AM  Advanced Directives  Does Patient Have a Medical Advance Directive? No No No No No No No  Does patient want to make changes to medical advance directive?    Yes (MAU/Ambulatory/Procedural Areas - Information given)     Would patient like information on creating a medical advance directive?  No - Patient declined    No - Patient declined     Current Medications (verified) Outpatient Encounter Medications as of 12/23/2023  Medication Sig   clindamycin-tretinoin (VELTIN) gel Apply to acne on face at bedtime   clobetasol cream (TEMOVATE) 0.05 % Apply to rash on arms once or twice a day prn. Avoid applying to face, groin, and  axilla. Use as directed. Long-term use can cause thinning of the skin.   dorzolamide-timolol (COSOPT) 22.3-6.8 MG/ML ophthalmic solution Place 1 drop into the right eye 2 (two) times daily.   eszopiclone (LUNESTA) 2 MG TABS tablet TAKE 1/2 TO 1 TABLET BY MOUTH AT BEDTIME USE SPARINGLY AS NEEDED. INTOLERANT OF TRAZODONE.   ibuprofen (ADVIL) 200 MG tablet Take 600 mg by mouth every 6 (six) hours as needed. BID   latanoprost (XALATAN) 0.005 % ophthalmic solution Place 1 drop into both eyes at bedtime.    mupirocin ointment (BACTROBAN) 2 % Apply 1 Application topically 2 (two) times daily.   No facility-administered encounter medications on file as of 12/23/2023.    Allergies (verified) Chantix [varenicline], Trazodone and nefazodone, and Wellbutrin [bupropion]   History: Past Medical History:  Diagnosis Date   BRCA gene mutation negative in female    Glaucoma, both eyes    History of left breast cancer 2004--- per pt no recurrence   dx DCIS left breast s/p  total mastectomy w/ reconstruction,  NO chemo or radiation therpy  (ER and PR negative)   History of Paget's disease of breast    Insomnia    PONV (postoperative nausea and vomiting)    VIN II (vulvar intraepithelial neoplasia II)    Past Surgical History:  Procedure Laterality Date   CATARACT EXTRACTION W/ INTRAOCULAR LENS IMPLANT  1990 approx.   right   MASTECTOMY Left  PARTIAL MASTECTOMY INCORPATING NIPPLE AREOLAR COMPLEX Left 12-28-2002   dr young   [facility]   hx paget's disease left nipple   RE-EXCISION  PORTION OF THE LUMPECTOMY SITE Left 01-11-2003    dr young  [facility]   REMOVE AND REPLACE LENS Left 2023   TOTAL MASTECTOMY Left 02-16-2003  dr young [facility]   w/ AXILLARY LYMPH NODE DISSECTION AND IMMEDIATE BREAST RECONSTRUCTION WITH SALINE IMPLANT   VAGINAL HYSTERECTOMY  1983 approx.   VULVECTOMY N/A 02/11/2018   Procedure: WIDE EXCISION VULVECTOMY;  Surgeon: Lavina Hamman, MD;  Location: [facility];  Service:  Gynecology;  Laterality: N/A;   Family History  Problem Relation Age of Onset   Heart disease Mother 49       CHF   Heart disease Father        MI   Cancer Sister        lung cancer   Heart disease Sister        heart failure   Breast cancer Daughter 19       triple negative   BRCA 1/2 Daughter        tested negative   Breast cancer Maternal Grandmother 54   Drug abuse Brother        In [place]; (drugs)   Heart disease Brother        CAD (valve surg)   Cancer Brother    Colon cancer Neg Hx    Social History   Socioeconomic History   Marital status: Divorced    Spouse name: Not on file   Number of children: 2   Years of education: Not on file   Highest education level: Not on file  Occupational History   Occupation: Retail buyer: UNEMPLOYED  Tobacco Use   Smoking status: Every Day    Current packs/day: 0.50    Average packs/day: 0.5 packs/day for 30.0 years (15.0 ttl pk-yrs)    Types: Cigarettes   Smokeless tobacco: Never  Vaping Use   Vaping status: Never Used  Substance and Sexual Activity   Alcohol use: Never    Alcohol/week: 0.0 standard drinks of alcohol   Drug use: No   Sexual activity: Not on file  Other Topics Concern   Not on file  Social History Narrative   Divorced   2 kids local   As of 2024 working 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     Social Drivers of Corporate investment banker Strain: Low Risk  (12/23/2023)   Overall Financial Resource Strain (CARDIA)    Difficulty of Paying Living Expenses: Not hard at all  Food Insecurity: No Food Insecurity (12/23/2023)   Hunger Vital Sign    Worried About Running Out of Food in the Last Year: Never true    Ran Out of Food in the Last Year: Never true  Transportation Needs: No Transportation Needs (12/23/2023)   PRAPARE - Administrator, Civil Service (Medical): No    Lack of Transportation (Non-Medical): No   Physical Activity: Sufficiently Active (12/23/2023)   Exercise Vital Sign    Days of Exercise per Week: 7 days    Minutes of Exercise per Session: 40 min  Stress: No Stress Concern Present (12/23/2023)   [employer] of Occupational Health - Occupational Stress Questionnaire    Feeling of Stress : Only a little  Social Connections: Moderately Isolated (12/23/2023)   Social Connection and Isolation Panel [  NHANES]    Frequency of Communication with Friends and Family: More than three times a week    Frequency of Social Gatherings with Friends and Family: More than three times a week    Attends Religious Services: More than 4 times per year    Active Member of [employer] or Organizations: No    Attends Engineer, structural: Never    Marital Status: Divorced    Tobacco Counseling Ready to quit: Not Answered Counseling given: Not Answered   Clinical Intake:  Pre-visit preparation completed: Yes  Pain : No/denies pain    BMI - recorded: 22.15 Nutritional Status: BMI of 19-24  Normal Nutritional Risks: None Diabetes: No  How often do you need to have someone help you when you read instructions, pamphlets, or other written materials from your doctor or pharmacy?: 1 - Never  Interpreter Needed?: No  Comments: lives alone Information entered by :: B.Vendetta Pittinger,LPN   Activities of Daily Living    12/23/2023    1:22 PM  In your present state of health, do you have any difficulty performing the following activities:  Hearing? 0  Vision? 0  Difficulty concentrating or making decisions? 0  Walking or climbing stairs? 0  Dressing or bathing? 0  Doing errands, shopping? 0  Preparing Food and eating ? N  Using the Toilet? N  In the past six months, have you accidently leaked urine? N  Do you have problems with loss of bowel control? N  Managing your Medications? N  Managing your Finances? N  Housekeeping or managing your Housekeeping? N    Patient Care Team: Joaquim Nam, MD as PCP - General (Family Medicine)  Indicate any recent Medical Services you may have received from other than [facility] providers in the past year (date may be approximate).     Assessment:   This is a routine wellness examination for Maddilyn.  Hearing/Vision screen Hearing Screening - Comments:: Pt says her hearing is good Vision Screening - Comments:: Pt says her hearing is good Dr C.Groat   Goals Addressed             This Visit's Progress    Patient Stated   On track    12/23/2023, I will continue to play golf 3-5 times a week. I will walk 5 days a week for about 1 hour.     Patient Stated   On track    12/23/2023-Would like to continue to work on drinking more, water and exercise.        Depression Screen    12/23/2023    1:12 PM 12/20/2022    9:50 AM 12/18/2022    2:14 PM 12/15/2021    2:13 PM 12/14/2020   10:44 AM  PHQ 2/9 Scores  PHQ - 2 Score 0 0 0 0 0  PHQ- 9 Score  0   0    Fall Risk    12/23/2023    1:08 PM 12/20/2022    9:50 AM 12/18/2022    2:20 PM 12/15/2021    2:09 PM 01/19/2021   10:57 AM  Fall Risk   Falls in the past year? 0 0 0 0 0  Number falls in past yr: 0 0 0 0 0  Injury with Fall? 0 0 0 0 0  Risk for fall due to : No Fall Risks No Fall Risks No Fall Risks No Fall Risks   Follow up Education provided;Falls prevention discussed Falls evaluation completed Falls prevention discussed;Falls evaluation completed  Falls prevention discussed     MEDICARE RISK AT HOME: Medicare Risk at Home Any stairs in or around the home?: Yes If so, are there any without handrails?: Yes Home free of loose throw rugs in walkways, pet beds, electrical cords, etc?: Yes Adequate lighting in your home to reduce risk of falls?: Yes Life alert?: No Use of a cane, walker or w/c?: No Grab bars in the bathroom?: No Shower chair or bench in shower?: No Elevated toilet seat or a handicapped toilet?: Yes  TIMED UP AND GO:  Was the test performed?  No    Cognitive  Function:    12/14/2020   10:48 AM  MMSE - Mini Mental State Exam  Not completed: Unable to complete        12/23/2023    1:24 PM 12/18/2022    2:30 PM  6CIT Screen  What Year? 0 points 0 points  What month? 0 points 0 points  What time? 0 points 0 points  Count back from 20 0 points 0 points  Months in reverse 0 points 0 points  Repeat phrase 0 points 0 points  Total Score 0 points 0 points    Immunizations Immunization History  Administered Date(s) Administered   Influenza Whole 08/19/2020   Influenza, High Dose Seasonal PF 07/10/2022   Influenza-Unspecified 08/08/2014, 08/06/2015, 08/05/2016, 08/05/2017, 08/05/2018, 08/20/2019, 08/05/2021, 08/05/2023   Moderna Sars-Covid-2 Vaccination 11/16/2020, 12/14/2020   Td 11/05/1997    TDAP status: Up to date  Flu Vaccine status: Up to date  Pneumococcal vaccine status: Declined,  Education has been provided regarding the importance of this vaccine but patient still declined. Advised may receive this vaccine at local pharmacy or Health Dept. Aware to provide a copy of the vaccination record if obtained from local pharmacy or Health Dept. Verbalized acceptance and understanding.   Covid-19 vaccine status: Completed vaccines  Qualifies for Shingles Vaccine? Yes   Zostavax completed No   Shingrix Completed?: No.    Education has been provided regarding the importance of this vaccine. Patient has been advised to call insurance company to determine out of pocket expense if they have not yet received this vaccine. Advised may also receive vaccine at local pharmacy or Health Dept. Verbalized acceptance and understanding.  Screening Tests Health Maintenance  Topic Date Due   Pneumonia Vaccine 58+ Years old (1 of 2 - PCV) Never done   Zoster Vaccines- Shingrix (1 of 2) Never done   DTaP/Tdap/Td (2 - Tdap) 11/06/2007   DEXA SCAN  Never done   MAMMOGRAM  09/11/2024   Medicare Annual Wellness (AWV)  12/22/2024   Fecal DNA (Cologuard)   02/28/2025   INFLUENZA VACCINE  Completed   Hepatitis C Screening  Completed   HPV VACCINES  Aged Out   COVID-19 Vaccine  Discontinued    Health Maintenance  Health Maintenance Due  Topic Date Due   Pneumonia Vaccine 56+ Years old (1 of 2 - PCV) Never done   Zoster Vaccines- Shingrix (1 of 2) Never done   DTaP/Tdap/Td (2 - Tdap) 11/06/2007   DEXA SCAN  Never done    Colorectal cancer screening: Type of screening: Cologuard. Completed 02/28/2022. Repeat every 3 years  Mammogram status: Completed 11/24. Repeat every year  Bone Density: Pt DECLINED  Lung Cancer Screening: (Low Dose CT Chest recommended if Age 63-80 years, 20 pack-year currently smoking OR have quit w/in 15years.) does not qualify.   Lung Cancer Screening Referral: no  Additional Screening:  Hepatitis C Screening: does  not qualify; Completed 10/12/2016  Vision Screening: Recommended annual ophthalmology exams for early detection of glaucoma and other disorders of the eye. Is the patient up to date with their annual eye exam?  Yes  Who is the provider or what is the name of the office in which the patient attends annual eye exams? Dr Zetta Bills If pt is not established with a provider, would they like to be referred to a provider to establish care? No .   Dental Screening: Recommended annual dental exams for proper oral hygiene  Diabetic Foot Exam: n/a  Community Resource Referral / Chronic Care Management: CRR required this visit?  No   CCM required this visit?  No     Plan:     I have personally reviewed and noted the following in the patient's chart:   Medical and social history Use of alcohol, tobacco or illicit drugs  Current medications and supplements including opioid prescriptions. Patient is not currently taking opioid prescriptions. Functional ability and status Nutritional status Physical activity Advanced directives List of other physicians Hospitalizations, surgeries, and ER visits in  previous 12 months Vitals Screenings to include cognitive, depression, and falls Referrals and appointments  In addition, I have reviewed and discussed with patient certain preventive protocols, quality metrics, and best practice recommendations. A written personalized care plan for preventive services as well as general preventive health recommendations were provided to patient.     Sue Lush, LPN   [id number]   After Visit Summary: (MyChart) Due to this being a telephonic visit, the after visit summary with patients personalized plan was offered to patient via MyChart   Nurse Notes: The patient states she is doing well and has no concerns or questions at this time.

## 2023-12-24 ENCOUNTER — Ambulatory Visit: Payer: PPO | Admitting: Family Medicine

## 2023-12-24 ENCOUNTER — Encounter: Payer: Self-pay | Admitting: Family Medicine

## 2023-12-24 VITALS — BP 128/72 | HR 83 | Temp 98.6°F | Ht 67.91 in | Wt 152.0 lb

## 2023-12-24 DIAGNOSIS — R195 Other fecal abnormalities: Secondary | ICD-10-CM | POA: Diagnosis not present

## 2023-12-24 DIAGNOSIS — Z7189 Other specified counseling: Secondary | ICD-10-CM

## 2023-12-24 DIAGNOSIS — G4700 Insomnia, unspecified: Secondary | ICD-10-CM | POA: Diagnosis not present

## 2023-12-24 DIAGNOSIS — E785 Hyperlipidemia, unspecified: Secondary | ICD-10-CM | POA: Diagnosis not present

## 2023-12-24 DIAGNOSIS — Z0000 Encounter for general adult medical examination without abnormal findings: Secondary | ICD-10-CM

## 2023-12-24 NOTE — Progress Notes (Unsigned)
Flu 2024 Shingles discussed with patient.   PNA vaccine encouraged.  Tetanus 1999 covid vaccine 2022 Cologuard positive 2023.  encourage GI f/u, d/w pt.  Advised GI eval- specifically d/w pt about possible GI malignancy and rationale for colonoscopy.  She hasn't seen blood in stool and BMs are still normal.   Breast cancer screening 2024 Bone density test discussed with patient.  Encouraged. Declined.   D/w pt f/u with GYN clinic.  Advance directive. Would have her daughter Gevena Cotton if patient were incapacitated.  Smoking d/w pt.  Encouraged cessation. Averaging about ~1/2 PPD or less.  She had been gradually cutting back then increased to 1/2 PPD.   L shoulder pain prev improved with prev course of prednisone.  Still in PT.    Lipids improved in the meantime.  She is working on limiting carbs.    Insomnia.  She is able to get 7 hours with lunesta.  She doesn't sleep well w/o med use.  No ADE on med.  Compliant.   Meds, vitals, and allergies reviewed.   ROS: Per HPI unless specifically indicated in ROS section   GEN: nad, alert and oriented HEENT: mucous membranes moist, minimal residual skin changes inferior to the nose, not ttp.  Improved per patient report.   NECK: supple w/o LA CV: rrr.  no murmur PULM: ctab, no inc wob ABD: soft, +bs EXT: no edema SKIN: well perfused.

## 2023-12-24 NOTE — Patient Instructions (Addendum)
Please check with gynecology.  Take care.  Glad to see you. Please let me know when you want to see the GI clinic.  The advice is to see the GI clinic since you had a positive cologuard.

## 2023-12-25 NOTE — Assessment & Plan Note (Signed)
Advance directive. Would have her daughter Kim designated if patient were incapacitated.  

## 2023-12-25 NOTE — Assessment & Plan Note (Signed)
Flu 2024 Shingles discussed with patient.   PNA vaccine encouraged.  Tetanus 1999 covid vaccine 2022 Cologuard positive 2023.  encourage GI f/u, d/w pt.  Advised GI eval- specifically d/w pt about possible GI malignancy and rationale for colonoscopy.  She hasn't seen blood in stool and BMs are still normal.   Breast cancer screening 2024 Bone density test discussed with patient.  Encouraged. Declined.   D/w pt f/u with GYN clinic.  Advance directive. Would have her daughter Wendy Pruitt if patient were incapacitated.  Smoking d/w pt.  Encouraged cessation. Averaging about ~1/2 PPD or less.  She had been gradually cutting back then increased to 1/2 PPD.

## 2023-12-25 NOTE — Assessment & Plan Note (Signed)
Cologuard positive 2023.  encourage GI f/u, d/w pt.  Advised GI eval- specifically d/w pt about possible GI malignancy and rationale for colonoscopy.  She hasn't seen blood in stool and BMs are still normal.

## 2023-12-25 NOTE — Assessment & Plan Note (Signed)
She is able to get 7 hours with lunesta.  She doesn't sleep well w/o med use.  No ADE on med.  Compliant.

## 2023-12-25 NOTE — Assessment & Plan Note (Signed)
Lipids improved in the meantime.  She is working on limiting carbs.

## 2024-02-17 DIAGNOSIS — H35363 Drusen (degenerative) of macula, bilateral: Secondary | ICD-10-CM | POA: Diagnosis not present

## 2024-02-17 DIAGNOSIS — H02055 Trichiasis without entropian left lower eyelid: Secondary | ICD-10-CM | POA: Diagnosis not present

## 2024-02-17 DIAGNOSIS — D3132 Benign neoplasm of left choroid: Secondary | ICD-10-CM | POA: Diagnosis not present

## 2024-02-17 DIAGNOSIS — H401113 Primary open-angle glaucoma, right eye, severe stage: Secondary | ICD-10-CM | POA: Diagnosis not present

## 2024-06-15 ENCOUNTER — Other Ambulatory Visit: Payer: Self-pay | Admitting: Family Medicine

## 2024-06-16 NOTE — Telephone Encounter (Signed)
 LOV: 12/24/2023 NOV: nothing scheduled Last Refill: eszopiclone  (LUNESTA ) 2 MG TABS tablet 12/18/2023 90 tablets 1 refill

## 2024-06-17 ENCOUNTER — Telehealth: Payer: Self-pay

## 2024-06-17 DIAGNOSIS — H401113 Primary open-angle glaucoma, right eye, severe stage: Secondary | ICD-10-CM | POA: Diagnosis not present

## 2024-06-17 DIAGNOSIS — Z961 Presence of intraocular lens: Secondary | ICD-10-CM | POA: Diagnosis not present

## 2024-06-17 NOTE — Telephone Encounter (Signed)
 Copied from CRM [id number]. Topic: Clinical - Request for Lab/Test Order >> Jun 17, 2024  9:23 AM Wendy Pruitt wrote: Reason for CRM: Patient would like lab orders to be put in for physical for next year in February

## 2024-06-17 NOTE — Telephone Encounter (Signed)
 I did not put in orders now.  We should wait until closer to the visit when she schedules a lab visit.  Thanks.

## 2024-06-17 NOTE — Telephone Encounter (Signed)
 Sent. Thanks.

## 2024-06-17 NOTE — Telephone Encounter (Signed)
 Closing encounter. Refill request has been sent to provider

## 2024-06-17 NOTE — Telephone Encounter (Signed)
 Copied from CRM [id number]. Topic: Clinical - Medication Question >> Jun 17, 2024  9:24 AM Wendy Pruitt I wrote: Reason for CRM: Patients pharmacy sent in requests for medication refill for eszopiclone  (LUNESTA ) 2 MG TABS tablet, Patient would like to make sure it has been received and being worked on.

## 2024-06-17 NOTE — Telephone Encounter (Signed)
 Patient notified

## 2024-07-22 ENCOUNTER — Ambulatory Visit: Payer: Medicare Other | Admitting: Dermatology

## 2024-07-22 ENCOUNTER — Encounter: Payer: Self-pay | Admitting: Dermatology

## 2024-07-22 DIAGNOSIS — W908XXA Exposure to other nonionizing radiation, initial encounter: Secondary | ICD-10-CM | POA: Diagnosis not present

## 2024-07-22 DIAGNOSIS — L853 Xerosis cutis: Secondary | ICD-10-CM

## 2024-07-22 DIAGNOSIS — D229 Melanocytic nevi, unspecified: Secondary | ICD-10-CM

## 2024-07-22 DIAGNOSIS — Z1283 Encounter for screening for malignant neoplasm of skin: Secondary | ICD-10-CM

## 2024-07-22 DIAGNOSIS — L821 Other seborrheic keratosis: Secondary | ICD-10-CM | POA: Diagnosis not present

## 2024-07-22 DIAGNOSIS — L820 Inflamed seborrheic keratosis: Secondary | ICD-10-CM | POA: Diagnosis not present

## 2024-07-22 DIAGNOSIS — L578 Other skin changes due to chronic exposure to nonionizing radiation: Secondary | ICD-10-CM

## 2024-07-22 DIAGNOSIS — D1801 Hemangioma of skin and subcutaneous tissue: Secondary | ICD-10-CM | POA: Diagnosis not present

## 2024-07-22 DIAGNOSIS — Z7189 Other specified counseling: Secondary | ICD-10-CM

## 2024-07-22 DIAGNOSIS — Z79899 Other long term (current) drug therapy: Secondary | ICD-10-CM

## 2024-07-22 DIAGNOSIS — L988 Other specified disorders of the skin and subcutaneous tissue: Secondary | ICD-10-CM

## 2024-07-22 DIAGNOSIS — L814 Other melanin hyperpigmentation: Secondary | ICD-10-CM

## 2024-07-22 MED ORDER — TRETINOIN 0.025 % EX CREA: TOPICAL_CREAM | CUTANEOUS | 11 refills | Status: AC

## 2024-07-22 NOTE — Progress Notes (Signed)
 Follow-Up Visit   Subjective  Wendy Pruitt is a 68 y.o. female who presents for the following: Skin Cancer Screening and Full Body Skin Exam  The patient presents for Total-Body Skin Exam (TBSE) for skin cancer screening and mole check. The patient has spots, moles and lesions to be evaluated, some may be new or changing and the patient may have concern these could be cancer.  The following portions of the chart were reviewed this encounter and updated as appropriate: medications, allergies, medical history  Review of Systems:  No other skin or systemic complaints except as noted in HPI or Assessment and Plan.  Objective  Well appearing patient in no apparent distress; mood and affect are within normal limits.  A full examination was performed including scalp, head, eyes, ears, nose, lips, neck, chest, axillae, abdomen, back, buttocks, bilateral upper extremities, bilateral lower extremities, hands, feet, fingers, toes, fingernails, and toenails. All findings within normal limits unless otherwise noted below.   Relevant physical exam findings are noted in the Assessment and Plan.  R zygoma x 1 Stuck-on, waxy, tan-brown papules --Discussed benign etiology and prognosis.   Assessment & Plan   SKIN CANCER SCREENING PERFORMED TODAY.  ACTINIC DAMAGE - Chronic condition, secondary to cumulative UV/sun exposure - diffuse scaly erythematous macules with underlying dyspigmentation - Recommend daily broad spectrum sunscreen SPF 30+ to sun-exposed areas, reapply every 2 hours as needed.  - Staying in the shade or wearing long sleeves, sun glasses (UVA+UVB protection) and wide brim hats (4-inch brim around the entire circumference of the hat) are also recommended for sun protection.  - Call for new or changing lesions.  LENTIGINES, SEBORRHEIC KERATOSES, HEMANGIOMAS - Benign normal skin lesions - Benign-appearing - Call for any changes  MELANOCYTIC NEVI - Tan-brown and/or  pink-flesh-colored symmetric macules and papules - Benign appearing on exam today - Observation - Call clinic for new or changing moles - Recommend daily use of broad spectrum spf 30+ sunscreen to sun-exposed areas.   Favre-Racouchot syndrome (FRS) -  Exam: open comedones around the eyes and nose with actinic changes.   Plan: Start Tretinoin  0.025% cream QHS. Topical retinoid medications like tretinoin /Retin-A , adapalene/Differin, tazarotene/Fabior, and Epiduo/Epiduo Forte can cause dryness and irritation when first started. Only apply a pea-sized amount to the entire affected area. Avoid applying it around the eyes, edges of mouth and creases at the nose. If you experience irritation, use a good moisturizer first and/or apply the medicine less often. If you are doing well with the medicine, you can increase how often you use it until you are applying every night. Be careful with sun protection while using this medication as it can make you sensitive to the sun. This medicine should not be used by pregnant women.   Hx of breast cancer - no LAD  Xerosis - diffuse xerotic patches - recommend gentle, hydrating skin care - gentle skin care handout given  INFLAMED SEBORRHEIC KERATOSIS R zygoma x 1 Symptomatic, irritating, patient would like treated.  Destruction of lesion - R zygoma x 1 Complexity: simple   Destruction method: cryotherapy   Informed consent: discussed and consent obtained   Timeout:  patient name, date of birth, surgical site, and procedure verified Lesion destroyed using liquid nitrogen: Yes   Region frozen until ice ball extended beyond lesion: Yes   Outcome: patient tolerated procedure well with no complications   Post-procedure details: wound care instructions given     Return in about 1 year (around 07/22/2025) for TBSE.  LILLETTE Rosina Mayans, CMA, am acting as scribe for Alm Rhyme, MD .  Documentation: I have reviewed the above documentation for accuracy and  completeness, and I agree with the above.  Alm Rhyme, MD

## 2024-07-22 NOTE — Patient Instructions (Addendum)
 Gentle Skin Care Guide  1. Bathe no more than once a day.  2. Avoid bathing in hot water  3. Use a mild soap like Dove, Vanicream, Cetaphil, CeraVe. Can use Lever 2000 or Cetaphil antibacterial soap  4. Use soap only where you need it. On most days, use it under your arms, between your legs, and on your feet. Let the water rinse other areas unless visibly dirty.  5. When you get out of the bath/shower, use a towel to gently blot your skin dry, don't rub it.  6. While your skin is still a little damp, apply a moisturizing cream such as Vanicream, CeraVe, Cetaphil, Eucerin, Sarna lotion or plain Vaseline Jelly. For hands apply Neutrogena [place] Hand Cream or Excipial Hand Cream.  7. Reapply moisturizer any time you start to itch or feel dry.  8. Sometimes using free and clear laundry detergents can be helpful. Fabric softener sheets should be avoided. Downy Free & Gentle liquid, or any liquid fabric softener that is free of dyes and perfumes, it acceptable to use  9. If your doctor has given you prescription creams you may apply moisturizers over them      Due to recent changes in healthcare laws, you may see results of your pathology and/or laboratory studies on MyChart before the doctors have had a chance to review them. We understand that in some cases there may be results that are confusing or concerning to you. Please understand that not all results are received at the same time and often the doctors may need to interpret multiple results in order to provide you with the best plan of care or course of treatment. Therefore, we ask that you please give us  2 business days to thoroughly review all your results before contacting the office for clarification. Should we see a critical lab result, you will be contacted sooner.   If You Need Anything After Your Visit  If you have any questions or concerns for your doctor, please call our main line at [phone] and press option 4 to reach  your doctor's medical assistant. If no one answers, please leave a voicemail as directed and we will return your call as soon as possible. Messages left after 4 pm will be answered the following business day.   You may also send us  a message via MyChart. We typically respond to MyChart messages within 1-2 business days.  For prescription refills, please ask your pharmacy to contact our office. Our fax number is [phone].  If you have an urgent issue when the clinic is closed that cannot wait until the next business day, you can page your doctor at the number below.    Please note that while we do our best to be available for urgent issues outside of office hours, we are not available 24/7.   If you have an urgent issue and are unable to reach us , you may choose to seek medical care at your doctor's office, retail clinic, urgent care center, or emergency room.  If you have a medical emergency, please immediately call 911 or go to the emergency department.  Pager Numbers  - Dr. Hester: [phone]  - Dr. Jackquline: [phone]  - Dr. Claudene: [phone]   - Dr. Raymund: [phone]  In the event of inclement weather, please call our main line at [phone] for an update on the status of any delays or closures.  Dermatology Medication Tips: Please keep the boxes that topical medications come in in order to help  keep track of the instructions about where and how to use these. Pharmacies typically print the medication instructions only on the boxes and not directly on the medication tubes.   If your medication is too expensive, please contact our office at [phone] option 4 or send us  a message through MyChart.   We are unable to tell what your co-pay for medications will be in advance as this is different depending on your insurance coverage. However, we may be able to find a substitute medication at lower cost or fill out paperwork to get insurance to cover a needed medication.    If a prior authorization is required to get your medication covered by your insurance company, please allow us  1-2 business days to complete this process.  Drug prices often vary depending on where the prescription is filled and some pharmacies may offer cheaper prices.  The website www.goodrx.com contains coupons for medications through different pharmacies. The prices here do not account for what the cost may be with help from insurance (it may be cheaper with your insurance), but the website can give you the price if you did not use any insurance.  - You can print the associated coupon and take it with your prescription to the pharmacy.  - You may also stop by our office during regular business hours and pick up a GoodRx coupon card.  - If you need your prescription sent electronically to a different pharmacy, notify our office through [facility] or by phone at [phone] option 4.     Si Usted Necesita Algo Despus de Su Visita  Tambin puede enviarnos un mensaje a travs de Clinical cytogeneticist. Por lo general respondemos a los mensajes de MyChart en el transcurso de 1 a 2 das hbiles.  Para renovar recetas, por favor pida a su farmacia que se ponga en contacto con nuestra oficina. Randi lakes de fax es [place] [phone].  Si tiene un asunto urgente cuando la clnica est cerrada y que no puede esperar hasta el siguiente da hbil, puede llamar/localizar a su doctor(a) al nmero que aparece a continuacin.   Por favor, tenga en cuenta que aunque hacemos todo lo posible para estar disponibles para asuntos urgentes fuera del horario de [place], no estamos disponibles las 24 horas del da, los 7 [address]  [address] de la [place].   Si tiene un problema urgente y no puede comunicarse con nosotros, puede optar por buscar atencin mdica  en el consultorio de su doctor(a), en una clnica privada, en un centro de atencin urgente o en una sala de emergencias.  Si tiene Engineer, drilling, por favor  llame inmediatamente al 911 o vaya a la sala de emergencias.  Nmeros de bper  - Dr. Hester: [phone]  - Dra. Jackquline: [phone]  - Dr. Claudene: [phone]  - Dra. Kitts: [phone]  En caso de inclemencias del [place], por favor llame a nuestra lnea principal al [phone] para una actualizacin sobre el estado de cualquier retraso o cierre.  Consejos para la medicacin en dermatologa: Por favor, guarde las cajas en las que vienen los medicamentos de uso tpico para ayudarle a seguir las instrucciones sobre dnde y cmo usarlos. Las farmacias generalmente imprimen las instrucciones del medicamento slo en las cajas y no directamente en los tubos del [place].   Si su medicamento es muy caro, por favor, pngase en contacto con landry rieger llamando al [phone] y presione la opcin 4 o envenos un mensaje a travs de Clinical cytogeneticist.   No podemos decirle cul  ser su copago por los medicamentos por adelantado ya que esto es diferente dependiendo de la cobertura de su seguro. Sin embargo, es posible que podamos encontrar un medicamento sustituto a Audiological scientist un formulario para que el seguro cubra el medicamento que se considera necesario.   Si se requiere una autorizacin previa para que su compaa de seguros malta su medicamento, por favor permtanos de 1 a 2 das hbiles para completar este proceso.  Los precios de los medicamentos varan con frecuencia dependiendo del Environmental consultant de dnde se surte la receta y alguna farmacias pueden ofrecer precios ms baratos.  El sitio web www.goodrx.com tiene cupones para medicamentos de Health and safety inspector. Los precios aqu no tienen en cuenta lo que podra costar con la ayuda del seguro (puede ser ms barato con su seguro), pero el sitio web puede darle el precio si no utiliz Tourist information centre manager.  - Puede imprimir el cupn correspondiente y llevarlo con su receta a la farmacia.  - Tambin puede pasar por nuestra oficina durante el  horario de atencin regular y Education officer, museum una tarjeta de cupones de GoodRx.  - Si necesita que su receta se enve electrnicamente a una farmacia diferente, informe a nuestra oficina a travs de MyChart de [place] o por telfono llamando al [phone] y presione la opcin 4.

## 2024-08-14 ENCOUNTER — Other Ambulatory Visit: Payer: Self-pay | Admitting: Family Medicine

## 2024-08-14 DIAGNOSIS — Z1231 Encounter for screening mammogram for malignant neoplasm of breast: Secondary | ICD-10-CM

## 2024-09-15 ENCOUNTER — Ambulatory Visit
Admission: RE | Admit: 2024-09-15 | Discharge: 2024-09-15 | Disposition: A | Source: Ambulatory Visit | Attending: Family Medicine | Admitting: Family Medicine

## 2024-09-15 DIAGNOSIS — Z1231 Encounter for screening mammogram for malignant neoplasm of breast: Secondary | ICD-10-CM | POA: Diagnosis not present

## 2024-09-17 ENCOUNTER — Ambulatory Visit: Payer: Self-pay | Admitting: Family Medicine

## 2024-12-23 ENCOUNTER — Ambulatory Visit: Payer: PPO

## 2024-12-25 ENCOUNTER — Encounter: Admitting: Family Medicine

## 2025-07-27 ENCOUNTER — Ambulatory Visit: Admitting: Dermatology
